# Patient Record
Sex: Female | Born: 1980
Health system: Southern US, Community
[De-identification: ages and names within clinical notes are randomized; demographics above are authoritative.]

## PROBLEM LIST (undated history)

## (undated) DIAGNOSIS — G4700 Insomnia, unspecified: Secondary | ICD-10-CM

## (undated) DIAGNOSIS — F988 Other specified behavioral and emotional disorders with onset usually occurring in childhood and adolescence: Secondary | ICD-10-CM

## (undated) DIAGNOSIS — R7309 Other abnormal glucose: Secondary | ICD-10-CM

## (undated) DIAGNOSIS — F419 Anxiety disorder, unspecified: Secondary | ICD-10-CM

## (undated) DIAGNOSIS — H53031 Strabismic amblyopia, right eye: Secondary | ICD-10-CM

## (undated) DIAGNOSIS — F410 Panic disorder [episodic paroxysmal anxiety] without agoraphobia: Secondary | ICD-10-CM

## (undated) DIAGNOSIS — J309 Allergic rhinitis, unspecified: Secondary | ICD-10-CM

## (undated) DIAGNOSIS — F32A Depression, unspecified: Secondary | ICD-10-CM

## (undated) DIAGNOSIS — G43909 Migraine, unspecified, not intractable, without status migrainosus: Secondary | ICD-10-CM

## (undated) DIAGNOSIS — F329 Major depressive disorder, single episode, unspecified: Secondary | ICD-10-CM

## (undated) DIAGNOSIS — J45909 Unspecified asthma, uncomplicated: Secondary | ICD-10-CM

## (undated) HISTORY — DX: Strabismic amblyopia, right eye: H53.031

## (undated) HISTORY — DX: Migraine, unspecified, not intractable, without status migrainosus: G43.909

## (undated) HISTORY — DX: Insomnia, unspecified: G47.00

## (undated) HISTORY — DX: Other abnormal glucose: R73.09

## (undated) HISTORY — DX: Panic disorder (episodic paroxysmal anxiety): F41.0

## (undated) HISTORY — DX: Other specified behavioral and emotional disorders with onset usually occurring in childhood and adolescence: F98.8

## (undated) HISTORY — DX: Depression, unspecified: F32.A

## (undated) HISTORY — DX: Anxiety disorder, unspecified: F41.9

## (undated) HISTORY — DX: Unspecified asthma, uncomplicated: J45.909

## (undated) HISTORY — DX: Allergic rhinitis, unspecified: J30.9

## (undated) HISTORY — DX: Major depressive disorder, single episode, unspecified: F32.9

---

## 2003-12-27 ENCOUNTER — Ambulatory Visit: Payer: Self-pay | Admitting: Psychiatry

## 2004-04-10 ENCOUNTER — Ambulatory Visit: Payer: Self-pay

## 2004-04-15 ENCOUNTER — Ambulatory Visit: Payer: Self-pay

## 2005-07-09 ENCOUNTER — Ambulatory Visit: Payer: Self-pay

## 2005-10-29 DIAGNOSIS — F900 Attention-deficit hyperactivity disorder, predominantly inattentive type: Secondary | ICD-10-CM | POA: Insufficient documentation

## 2005-10-29 DIAGNOSIS — G43109 Migraine with aura, not intractable, without status migrainosus: Secondary | ICD-10-CM | POA: Insufficient documentation

## 2006-12-16 DIAGNOSIS — F4321 Adjustment disorder with depressed mood: Secondary | ICD-10-CM | POA: Insufficient documentation

## 2009-03-18 ENCOUNTER — Ambulatory Visit: Payer: Self-pay | Admitting: Family Medicine

## 2013-05-15 LAB — CBC AND DIFFERENTIAL
HCT: 48 % — AB (ref 36–46)
Hemoglobin: 16 g/dL (ref 12.0–16.0)
Platelets: 294 10*3/uL (ref 150–399)
WBC: 8.1 10^3/mL

## 2013-06-23 ENCOUNTER — Ambulatory Visit: Payer: Self-pay | Admitting: Medical

## 2013-06-23 LAB — URINALYSIS, COMPLETE: RBC,UR: >30 /HPF (ref 0–5)

## 2013-06-25 LAB — URINE CULTURE

## 2015-04-04 ENCOUNTER — Encounter: Payer: Self-pay | Admitting: Family Medicine

## 2015-04-04 ENCOUNTER — Ambulatory Visit (INDEPENDENT_AMBULATORY_CARE_PROVIDER_SITE_OTHER): Payer: BLUE CROSS/BLUE SHIELD | Admitting: Family Medicine

## 2015-04-04 VITALS — BP 132/94 | HR 95 | Temp 99.1°F | Resp 16 | Wt 121.8 lb

## 2015-04-04 DIAGNOSIS — R509 Fever, unspecified: Secondary | ICD-10-CM | POA: Diagnosis not present

## 2015-04-04 DIAGNOSIS — J029 Acute pharyngitis, unspecified: Secondary | ICD-10-CM

## 2015-04-04 LAB — POC INFLUENZA A&B (BINAX/QUICKVUE)
INFLUENZA A, POC: NEGATIVE
Influenza B, POC: NEGATIVE

## 2015-04-04 LAB — POCT RAPID STREP A (OFFICE): RAPID STREP A SCREEN: NEGATIVE

## 2015-04-04 MED ORDER — AZITHROMYCIN 250 MG PO TABS: ORAL_TABLET | ORAL | Status: DC

## 2015-04-04 MED ORDER — PROMETHAZINE-DM 6.25-15 MG/5ML PO SYRP: 5.0000 mL | ORAL_SOLUTION | Freq: Four times a day (QID) | ORAL | Status: DC | PRN

## 2015-04-04 NOTE — Progress Notes (Signed)
Patient ID: Debra Garza, female   DOB: November 03, 1980, 35 y.o.   MRN: [id number]   Patient: Debra Garza Female    DOB: November 03, 1980   35 y.o.   MRN: [id number] Visit Date: 04/04/2015  Today's Provider: Dortha Kernennis Chrismon, PA   Chief Complaint  Patient presents with  . URI   Subjective:    URI  This is a new problem. The current episode started in the past 7 days. The problem has been unchanged. Associated symptoms include congestion, coughing and headaches. Associated symptoms comments: fever. Treatments tried: Nyquil. The treatment provided moderate relief.    Patient Active Problem List   Diagnosis Date Noted  . Adjustment disorder with depressed mood 12/16/2006  . ADD (attention deficit hyperactivity disorder, inattentive type) 10/29/2005  . Acute onset aura migraine 10/29/2005   Past Surgical History  Procedure Laterality Date  . No past surgeries     Family History  Problem Relation Age of Onset  . Healthy Mother   . Healthy Father   . Healthy Sister   . Liver cancer Maternal Grandmother    Previous Medications   ALBUTEROL (PROVENTIL HFA;VENTOLIN HFA) 108 (90 BASE) MCG/ACT INHALER       BUPROPION (WELLBUTRIN XL) 150 MG 24 HR TABLET    TK 1 T PO QAM   DEXTROAMPHETAMINE (DEXEDRINE SPANSULE) 10 MG 24 HR CAPSULE    TK 1 TO 3 CS PO D   ELETRIPTAN (RELPAX) 20 MG TABLET    Take by mouth.   EPINEPHRINE, ANAPHYLAXIS THERAPY AGENTS,       MIRTAZAPINE (REMERON) 15 MG TABLET    TK 1 T PO QHS   MULTIPLE VITAMIN PO       NUVARING 0.12-0.015 MG/24HR VAGINAL RING       RANITIDINE (ZANTAC) 75 MG TABLET    Take by mouth.   VYVANSE 40 MG CAPSULE    TK 1 C PO ONCE D   Allergies  Allergen Reactions  . Codeine     Other reaction(s): Vomiting  . Penicillins Hives  . Tape     Review of Systems  Constitutional: Positive for fever.  HENT: Positive for congestion.   Eyes: Negative.   Respiratory: Positive for cough.   Cardiovascular: Negative.   Gastrointestinal: Negative.    Endocrine: Negative.   Genitourinary: Negative.   Musculoskeletal: Negative.   Skin: Negative.   Allergic/Immunologic: Negative.   Neurological: Positive for headaches.  Hematological: Negative.   Psychiatric/Behavioral: Negative.     Social History  Substance Use Topics  . Smoking status: Never Smoker   . Smokeless tobacco: Never Used  . Alcohol Use: 0.0 oz/week    0 Standard drinks or equivalent per week     Comment: occasionally   Objective:   BP 132/94 mmHg  Pulse 95  Temp(Src) 99.1 F (37.3 C) (Oral)  Resp 16  Wt 121 lb 12.8 oz (55.248 kg)  SpO2 98%  Physical Exam  Constitutional: She is oriented to person, place, and time. She appears well-developed and well-nourished.  HENT:  Head: Normocephalic.  Right Ear: External ear normal.  Left Ear: External ear normal.  Red posterior pharynx without exudates. Good sinus transillumination.  Eyes: Conjunctivae and EOM are normal.  Neck: Normal range of motion. Neck supple.  Cardiovascular: Normal rate, regular rhythm and normal heart sounds.   Pulmonary/Chest: Effort normal and breath sounds normal.  Abdominal: Soft. Bowel sounds are normal.  Lymphadenopathy:    She has no cervical adenopathy.  Neurological: She is alert and  oriented to person, place, and time.  Skin: No rash noted.      Assessment & Plan:     1. Fever, unspecified fever cause Low grade over the past 3-4 days. Negative flu test. Throat very red and inflamed. May use Tylenol or Advil prn. Increase fluid intake and home to rest. No work for 3 days. 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 - POC Influenza A&B(BINAX/QUICKVUE) - azithromycin (ZITHROMAX) 250 MG tablet; Take 2 tablets by mouth the first day then one daily for 4 days  Dispense: 6 tablet; Refill: 0  2. Sorethroat Onset with some cough and nasal congestion. Strep test negative. Given Z-pak and cough/congestion syrup. May gargle with hot saltwater. Recheck prn. - POCT  rapid strep A - promethazine-dextromethorphan (PROMETHAZINE-DM) 6.25-15 MG/5ML syrup; Take 5 mLs by mouth 4 (four) times daily as needed for cough. Also, for congestion.  Dispense: 118 mL; Refill: 0

## 2015-04-04 NOTE — Patient Instructions (Addendum)
Viral Infections A virus is a type of germ. Viruses can cause:  Minor sore throats.  Aches and pains.  Headaches.  Runny nose.  Rashes.  Watery eyes.  Tiredness.  Coughs.  Loss of appetite.  Feeling sick to your stomach (nausea).  Throwing up (vomiting).  Watery poop (diarrhea). HOME CARE   Only take medicines as told by your doctor.  Drink enough water and fluids to keep your pee (urine) clear or pale yellow. Sports drinks are a good choice.  Get plenty of rest and eat healthy. Soups and broths with crackers or rice are fine. GET HELP RIGHT AWAY IF:   You have a very bad headache.  You have shortness of breath.  You have chest pain or neck pain.  You have an unusual rash.  You cannot stop throwing up.  You have watery poop that does not stop.  You cannot keep fluids down.  You or your child has a temperature by mouth above 102 F (38.9 C), not controlled by medicine.  Your baby is older than 3 months with a rectal temperature of 102 F (38.9 C) or higher.  Your baby is 313 months old or younger with a rectal temperature of 100.4 F (38 C) or higher. MAKE SURE YOU:   Understand these instructions.  Will watch this condition.  Will get help right away if you are not doing well or get worse.   This information is not intended to replace advice given to you by your health care provider. Make sure you discuss any questions you have with your health care provider.   Document Released: 12/25/2007 Document Revised: 04/05/2011 Document Reviewed: 06/19/2014 Elsevier Interactive Patient Education 2016 [employer] Fever, Adult A fever is an increase in the body's temperature. It is usually defined as a temperature of 100F (38C) or higher. Brief mild or moderate fevers generally have no long-term effects, and they often do not require treatment. Moderate or high fevers may make you feel uncomfortable and can sometimes be a sign of a serious illness or  disease. The sweating that may occur with repeated or prolonged fever may also cause dehydration. Fever is confirmed by taking a temperature with a thermometer. A measured temperature can vary with: Age. Time of day. Location of the thermometer: Mouth (oral). Rectum (rectal). Ear (tympanic). Underarm (axillary). Forehead (temporal). HOME CARE INSTRUCTIONS Pay attention to any changes in your symptoms. Take these actions to help with your condition: Take over-the counter and prescription medicines only as told by your health care provider. Follow the dosing instructions carefully. If you were prescribed an antibiotic medicine, take it as told by your health care provider. Do not stop taking the antibiotic even if you start to feel better. Rest as needed. Drink enough fluid to keep your urine clear or pale yellow. This helps to prevent dehydration. Sponge yourself or bathe with room-temperature water to help reduce your body temperature as needed. Do not use ice water. Do not overbundle yourself in blankets or heavy clothes. SEEK MEDICAL CARE IF: You vomit. You cannot eat or drink without vomiting. You have diarrhea. You have pain when you urinate. Your symptoms do not improve with treatment. You develop new symptoms. You develop excessive weakness. SEEK IMMEDIATE MEDICAL CARE IF: You have shortness of breath or have trouble breathing. You are dizzy or you faint. You are disoriented or confused. You develop signs of dehydration, such as a dry mouth, decreased urination, or paleness. You develop severe pain in your abdomen.  You have persistent vomiting or diarrhea. You develop a skin rash. Your symptoms suddenly get worse.   This information is not intended to replace advice given to you by your health care provider. Make sure you discuss any questions you have with your health care provider.   Document Released: 07/07/2000 Document Revised: 10/02/2014 Document Reviewed:  03/07/2014 Elsevier Interactive Patient Education [employer]

## 2015-06-13 ENCOUNTER — Other Ambulatory Visit: Payer: Self-pay | Admitting: Family Medicine

## 2015-06-13 MED ORDER — EPINEPHRINE 0.3 MG/0.3ML IJ SOAJ: 0.3000 mg | Freq: Once | INTRAMUSCULAR | Status: DC

## 2015-06-13 MED ORDER — ALBUTEROL SULFATE HFA 108 (90 BASE) MCG/ACT IN AERS: 2.0000 | INHALATION_SPRAY | RESPIRATORY_TRACT | Status: DC | PRN

## 2015-06-22 ENCOUNTER — Other Ambulatory Visit: Payer: Self-pay | Admitting: Family Medicine

## 2015-08-12 LAB — HM PAP SMEAR: HM PAP: NEGATIVE

## 2015-10-23 ENCOUNTER — Encounter: Payer: Self-pay | Admitting: Emergency Medicine

## 2015-10-23 ENCOUNTER — Emergency Department
Admission: EM | Admit: 2015-10-23 | Discharge: 2015-10-23 | Disposition: A | Discharge status: Home / Self Care | Payer: 59 | Attending: Emergency Medicine | Admitting: Emergency Medicine

## 2015-10-23 DIAGNOSIS — Y69 Unspecified misadventure during surgical and medical care: Secondary | ICD-10-CM | POA: Insufficient documentation

## 2015-10-23 DIAGNOSIS — T50995A Adverse effect of other drugs, medicaments and biological substances, initial encounter: Secondary | ICD-10-CM | POA: Diagnosis not present

## 2015-10-23 DIAGNOSIS — R000 Tachycardia, unspecified: Secondary | ICD-10-CM | POA: Diagnosis not present

## 2015-10-23 DIAGNOSIS — F908 Attention-deficit hyperactivity disorder, other type: Secondary | ICD-10-CM | POA: Diagnosis not present

## 2015-10-23 DIAGNOSIS — T485X5A Adverse effect of other anti-common-cold drugs, initial encounter: Secondary | ICD-10-CM | POA: Diagnosis not present

## 2015-10-23 DIAGNOSIS — Z79899 Other long term (current) drug therapy: Secondary | ICD-10-CM | POA: Insufficient documentation

## 2015-10-23 DIAGNOSIS — T50905A Adverse effect of unspecified drugs, medicaments and biological substances, initial encounter: Secondary | ICD-10-CM

## 2015-10-23 DIAGNOSIS — T887XXA Unspecified adverse effect of drug or medicament, initial encounter: Secondary | ICD-10-CM | POA: Insufficient documentation

## 2015-10-23 DIAGNOSIS — Z792 Long term (current) use of antibiotics: Secondary | ICD-10-CM | POA: Insufficient documentation

## 2015-10-23 DIAGNOSIS — Z791 Long term (current) use of non-steroidal anti-inflammatories (NSAID): Secondary | ICD-10-CM | POA: Diagnosis not present

## 2015-10-23 DIAGNOSIS — R05 Cough: Secondary | ICD-10-CM | POA: Diagnosis present

## 2015-10-23 LAB — BASIC METABOLIC PANEL
ANION GAP: 8 (ref 5–15)
BUN: 11 mg/dL (ref 6–20)
CALCIUM: 9.4 mg/dL (ref 8.9–10.3)
CO2: 23 mmol/L (ref 22–32)
Chloride: 103 mmol/L (ref 101–111)
Creatinine, Ser: 0.9 mg/dL (ref 0.44–1.00)
Glucose, Bld: 125 mg/dL — ABNORMAL HIGH (ref 65–99)
Potassium: 3.3 mmol/L — ABNORMAL LOW (ref 3.5–5.1)
Sodium: 134 mmol/L — ABNORMAL LOW (ref 135–145)

## 2015-10-23 LAB — URINE DRUG SCREEN, QUALITATIVE (ARMC ONLY)
Amphetamines, Ur Screen: NOT DETECTED
BARBITURATES, UR SCREEN: NOT DETECTED
Benzodiazepine, Ur Scrn: NOT DETECTED
CANNABINOID 50 NG, UR ~~LOC~~: NOT DETECTED
COCAINE METABOLITE, UR ~~LOC~~: NOT DETECTED
MDMA (Ecstasy)Ur Screen: NOT DETECTED
Methadone Scn, Ur: NOT DETECTED
OPIATE, UR SCREEN: NOT DETECTED
PHENCYCLIDINE (PCP) UR S: NOT DETECTED
Tricyclic, Ur Screen: NOT DETECTED

## 2015-10-23 LAB — URINALYSIS COMPLETE WITH MICROSCOPIC (ARMC ONLY)
BILIRUBIN URINE: NEGATIVE
Bacteria, UA: NONE SEEN
GLUCOSE, UA: NEGATIVE mg/dL
HGB URINE DIPSTICK: NEGATIVE
KETONES UR: NEGATIVE mg/dL
Leukocytes, UA: NEGATIVE
Nitrite: NEGATIVE
Protein, ur: NEGATIVE mg/dL
SPECIFIC GRAVITY, URINE: 1.001 — AB (ref 1.005–1.030)
pH: 7 (ref 5.0–8.0)

## 2015-10-23 LAB — CBC
HEMATOCRIT: 46.1 % (ref 35.0–47.0)
Hemoglobin: 16.1 g/dL — ABNORMAL HIGH (ref 12.0–16.0)
MCH: 29.6 pg (ref 26.0–34.0)
MCHC: 34.9 g/dL (ref 32.0–36.0)
MCV: 85 fL (ref 80.0–100.0)
Platelets: 301 10*3/uL (ref 150–440)
RBC: 5.42 MIL/uL — ABNORMAL HIGH (ref 3.80–5.20)
RDW: 12.6 % (ref 11.5–14.5)
WBC: 9.2 10*3/uL (ref 3.6–11.0)

## 2015-10-23 LAB — TROPONIN I

## 2015-10-23 LAB — POCT PREGNANCY, URINE: Preg Test, Ur: NEGATIVE

## 2015-10-23 MED ORDER — SODIUM CHLORIDE 0.9 % IV BOLUS (SEPSIS)
1000.0000 mL | Freq: Once | INTRAVENOUS | Status: AC
  Administered 2015-10-23: 1000 mL via INTRAVENOUS

## 2015-10-23 NOTE — ED Provider Notes (Signed)
[facility] Emergency Department Provider Note  ____________________________________________   I have reviewed the triage vital signs and the nursing notes.   HISTORY  Chief Complaint Tachycardia    HPI Debra Garza is a 35 y.o. female states she's had a mild runny nose for a few days. She started takingchlorpheniramine maleate with dextromethorphan pills. She took one this evening and became very jittery and hyper she states. She has a problem with this in the past. She especially has a problem taking pseudoephedrine because it has the same reaction. She feels jittery and nervous. This is a known side effect of the medication. She has not had any hallucinations. She did not take an overdose. She has no SI or HI. She states she is feeling much better now that time has gone by she still feels a little bit jittery. She also wonders if she might be dehydrated because she hasn't been eating or drinking very much because her nose is been stuffy. She is on a productive cough fever chills headache stiff neck chest pain shortness of breath or any other new or worrisome symptoms.     History reviewed. No pertinent past medical history.  Patient Active Problem List   Diagnosis Date Noted  . Adjustment disorder with depressed mood 12/16/2006  . ADD (attention deficit hyperactivity disorder, inattentive type) 10/29/2005  . Acute onset aura migraine 10/29/2005    Past Surgical History:  Procedure Laterality Date  . NO PAST SURGERIES      Prior to Admission medications   Medication Sig Start Date End Date Taking? Authorizing Provider  azithromycin (ZITHROMAX) 250 MG tablet Take 2 tablets by mouth the first day then one daily for 4 days 04/04/15   Jodell Ciproennis E Chrismon, PA  buPROPion (WELLBUTRIN XL) 150 MG 24 hr tablet TK 1 T PO QAM 03/16/15   Historical Provider, MD  dextroamphetamine (DEXEDRINE SPANSULE) 10 MG 24 hr capsule TK 1 TO 3 CS PO D 03/29/15   Historical  Provider, MD  eletriptan (RELPAX) 20 MG tablet Take by mouth. 06/26/12   Historical Provider, MD  EPINEPHrine 0.3 mg/0.3 mL IJ SOAJ injection Inject 0.3 mLs (0.3 mg total) into the muscle once. 06/13/15   Jodell Ciproennis E Chrismon, PA  mirtazapine (REMERON) 15 MG tablet TK 1 T PO QHS 03/01/15   Historical Provider, MD  MULTIPLE VITAMIN PO  06/07/06   Historical Provider, MD  NUVARING 0.12-0.015 MG/24HR vaginal ring  03/16/15   Historical Provider, MD  PROAIR HFA 108 (90 Base) MCG/ACT inhaler INHALE 2 PUFFS INTO THE LUNGS EVERY 4 HOURS AS NEEDED FOR WHEEZING OR SHORTNESS OF BREATH 06/29/15   Jodell Ciproennis E Chrismon, PA  promethazine-dextromethorphan (PROMETHAZINE-DM) 6.25-15 MG/5ML syrup Take 5 mLs by mouth 4 (four) times daily as needed for cough. Also, for congestion. 04/04/15   Jodell Ciproennis E Chrismon, PA  ranitidine (ZANTAC) 75 MG tablet Take by mouth.    Historical Provider, MD  VYVANSE 40 MG capsule TK 1 C PO ONCE D 03/29/15   Historical Provider, MD    Allergies Augmentin [amoxicillin-pot clavulanate]; Codeine; Penicillins; Sudafed [pseudoephedrine hcl]; and Tape  Family History  Problem Relation Age of Onset  . Healthy Mother   . Healthy Father   . Healthy Sister   . Liver cancer Maternal Grandmother     Social History Social History  Substance Use Topics  . Smoking status: Never Smoker  . Smokeless tobacco: Never Used  . Alcohol use 0.0 oz/week     Comment: occasionally  Review of Systems Constitutional: No fever/chills Eyes: No visual changes. ENT: No sore throat. No stiff neck no neck pain Cardiovascular: Denies chest pain. Respiratory: Denies shortness of breath. Gastrointestinal:   no vomiting.  No diarrhea.  No constipation. Genitourinary: Negative for dysuria. Musculoskeletal: Negative lower extremity swelling Skin: Negative for rash. Neurological: Negative for severe headaches, focal weakness or numbness. 10-point ROS otherwise  negative.  ____________________________________________   PHYSICAL EXAM:  VITAL SIGNS: ED Triage Vitals [10/23/15 0142]  Enc Vitals Group     BP (!) 190/108     Pulse Rate (!) 130     Resp 18     Temp 97.8 F (36.6 C)     Temp Source Oral     SpO2 100 %     Weight 118 lb (53.5 kg)     Height 5\' 8"  (1.727 m)     Head Circumference      Peak Flow      Pain Score      Pain Loc      Pain Edu?      Excl. in GC?     Constitutional: Alert and oriented. Well appearing and in no acute distress.Anxious but otherwise in no acute distress Eyes: Conjunctivae are normal. PERRL. EOMI. Head: Atraumatic. Nose: Mild clear congestion/rhinnorhea. Mouth/Throat: Mucous membranes are moist.  Oropharynx non-erythematous. Neck: No stridor.   Nontender with no meningismus Cardiovascular: Normal rate, regular rhythm. Grossly normal heart sounds.  Good peripheral circulation. Respiratory: Normal respiratory effort.  No retractions. Lungs CTAB. Abdominal: Soft and nontender. No distention. No guarding no rebound Back:  There is no focal tenderness or step off.  there is no midline tenderness there are no lesions noted. there is no CVA tenderness Musculoskeletal: No lower extremity tenderness, no upper extremity tenderness. No joint effusions, no DVT signs strong distal pulses no edema Neurologic:  Normal speech and language. No gross focal neurologic deficits are appreciated.  Skin:  Skin is warm, dry and intact. No rash noted.   ____________________________________________   LABS (all labs ordered are listed, but only abnormal results are displayed)  Labs Reviewed  CBC - Abnormal; Notable for the following:       Result Value   RBC 5.42 (*)    Hemoglobin 16.1 (*)    All other components within normal limits  BASIC METABOLIC PANEL - Abnormal; Notable for the following:    Sodium 134 (*)    Potassium 3.3 (*)    Glucose, Bld 125 (*)    All other components within normal limits  URINALYSIS  COMPLETEWITH MICROSCOPIC ([facility] ONLY) - Abnormal; Notable for the following:    Color, Urine COLORLESS (*)    APPearance CLEAR (*)    Specific Gravity, Urine 1.001 (*)    Squamous Epithelial / LPF 0-5 (*)    All other components within normal limits  TROPONIN I  URINE DRUG SCREEN, QUALITATIVE ([facility] ONLY)  POCT PREGNANCY, URINE   ____________________________________________  EKG  I personally interpreted any EKGs ordered by me or triage Sinus tachycardia rate 131 bpm no acute ST elevation or acute ST depression normal axis ____________________________________________  RADIOLOGY  I reviewed any imaging ordered by me or triage that were performed during my shift and, if possible, patient and/or family made aware of any abnormal findings. ____________________________________________   PROCEDURES  Procedure(s) performed: None  Procedures  Critical Care performed: None  ____________________________________________   INITIAL IMPRESSION / ASSESSMENT AND PLAN / ED COURSE  Pertinent labs & imaging results that were  available during my care of the patient were reviewed by me and considered in my medical decision making (see chart for details).  Patient here with mild tachycardia rate at this time is 104 after taking over-the-counter decongestants. She denies overdose. She is quite well-appearing although somewhat anxious. Mild tachycardia could be an adverse drug reaction or what she describes as "anxiety and jitteriness, or some combination of the 2. In any event, her heart rate is coming down and she is in no acute medical distress. We will give her IV fluids to see if it could be some contributing feature of dehydration although her blood work and her exam I certainly not show anything significant.   Clinical Course   ____________________________________________   FINAL CLINICAL IMPRESSION(S) / ED DIAGNOSES  Final diagnoses:  None      This chart was dictated using  voice recognition software.  Despite best efforts to proofread,  errors can occur which can change meaning.      Jeanmarie Plant, MD 10/23/15 [phone]

## 2015-10-23 NOTE — ED Triage Notes (Signed)
Patient ambulatory to triage with steady gait, without difficulty or distress noted; pt reports taking coricidin at 830 for cold symptoms; st since having dizziness and feeling shaky

## 2015-10-23 NOTE — Discharge Instructions (Signed)
I would strongly advised avoiding that particular medication in the future, return to the emergency room if you have any new or worrisome symptoms including chest pain shortness of breath or other concerning symptoms. Follow closely with your  primary care doctor.

## 2015-10-31 ENCOUNTER — Ambulatory Visit (INDEPENDENT_AMBULATORY_CARE_PROVIDER_SITE_OTHER): Payer: 59 | Admitting: Family Medicine

## 2015-10-31 ENCOUNTER — Encounter: Payer: Self-pay | Admitting: Family Medicine

## 2015-10-31 VITALS — BP 122/88 | HR 84 | Temp 98.0°F | Wt 115.8 lb

## 2015-10-31 DIAGNOSIS — T50905D Adverse effect of unspecified drugs, medicaments and biological substances, subsequent encounter: Secondary | ICD-10-CM | POA: Diagnosis not present

## 2015-10-31 DIAGNOSIS — J069 Acute upper respiratory infection, unspecified: Secondary | ICD-10-CM

## 2015-10-31 NOTE — Progress Notes (Signed)
Patient: Debra Garza Female    DOB: 1980/06/28   35 y.o.   MRN: [id number] Visit Date: 10/31/2015  Today's Provider: Dortha Kern, PA   Chief Complaint  Patient presents with  . Hospitalization Follow-up   Subjective:    HPI  Follow up Hospitalization  Patient was admitted to [facility] on 10/23/2015 and discharged on 10/23/2015. She was treated for adverse reaction to OTC medication Treatment for this included stop taking chlorpheniramine maleate with dextromethorphan pills She reports excellent compliance with treatment. She reports this condition is Improved.  ------------------------------------------------------------------------------------   Patient Active Problem List   Diagnosis Date Noted  . Adjustment disorder with depressed mood 12/16/2006  . ADD (attention deficit hyperactivity disorder, inattentive type) 10/29/2005  . Acute onset aura migraine 10/29/2005   Past Surgical History:  Procedure Laterality Date  . NO PAST SURGERIES     Family History  Problem Relation Age of Onset  . Healthy Mother   . Healthy Father   . Healthy Sister   . Liver cancer Maternal Grandmother    Allergies  Allergen Reactions  . Augmentin [Amoxicillin-Pot Clavulanate] Hives  . Codeine     Other reaction(s): Vomiting  . Hydrocodone Other (See Comments)  . Penicillins Hives  . Sudafed [Pseudoephedrine Hcl]   . Tape      Previous Medications   BUPROPION (WELLBUTRIN XL) 150 MG 24 HR TABLET    TK 1 T PO QAM   DEXTROAMPHETAMINE (DEXEDRINE SPANSULE) 10 MG 24 HR CAPSULE    TK 1 TO 3 CS PO D   ELETRIPTAN (RELPAX) 20 MG TABLET    Take by mouth.   EPINEPHRINE 0.3 MG/0.3 ML IJ SOAJ INJECTION    Inject 0.3 mLs (0.3 mg total) into the muscle once.   MIRTAZAPINE (REMERON) 15 MG TABLET    TK 1 T PO QHS   MULTIPLE VITAMIN PO       NUVARING 0.12-0.015 MG/24HR VAGINAL RING       PROAIR HFA 108 (90 BASE) MCG/ACT INHALER    INHALE 2 PUFFS INTO THE LUNGS EVERY 4 HOURS AS NEEDED FOR  WHEEZING OR SHORTNESS OF BREATH   RANITIDINE (ZANTAC) 75 MG TABLET    Take by mouth.   VYVANSE 40 MG CAPSULE    TK 1 C PO ONCE D    Review of Systems  Constitutional: Negative.   Respiratory: Negative.   Cardiovascular: Negative.     Social History  Substance Use Topics  . Smoking status: Never Smoker  . Smokeless tobacco: Never Used  . Alcohol use 0.0 oz/week     Comment: occasionally   Objective:   BP 122/88 (BP Location: Right Arm, Patient Position: Sitting, Cuff Size: Normal)   Pulse 84   Temp 98 F (36.7 C) (Oral)   Wt 115 lb 12.8 oz (52.5 kg)   LMP 10/23/2015 (Exact Date)   BMI 17.61 kg/m  BP Readings from Last 3 Encounters:  10/31/15 122/88  10/23/15 (!) 147/97  04/04/15 (!) 132/94   Physical Exam  Constitutional: She is oriented to person, place, and time. She appears well-developed and well-nourished. No distress.  HENT:  Head: Normocephalic and atraumatic.  Right Ear: Hearing and external ear normal.  Left Ear: Hearing and external ear normal.  Nose: Nose normal.  Slightly injected and cobblestone appearance of posterior pharynx.  Eyes: Conjunctivae and lids are normal. Right eye exhibits no discharge. Left eye exhibits no discharge. No scleral icterus.  Neck: Neck supple.  Cardiovascular: Normal rate and regular  rhythm.   Pulmonary/Chest: Effort normal and breath sounds normal. No respiratory distress.  Abdominal: Bowel sounds are normal.  Musculoskeletal: Normal range of motion.  Lymphadenopathy:    She has no cervical adenopathy.  Neurological: She is alert and oriented to person, place, and time.  Skin: Skin is intact. No lesion and no rash noted.  Psychiatric: She has a normal mood and affect. Her speech is normal and behavior is normal. Thought content normal.      Assessment & Plan:     1. Adverse effect of over-the-counter medication, subsequent encounter Onset 10-23-15 after taking a cough and cold medication. EKG in the ER was read as sinus  tachycardia. Labs essentially normal. Not having any recurrences. Will switch to plain Mucinex and salt water gargles prn. Recheck prn. Discontinue any decongestants and caffeine.  2. Upper respiratory tract infection, unspecified type No fever. Still some scratchy throat and slight cough. May use Mucinex (plain) with salt water gargles. Recheck prn.

## 2015-12-04 ENCOUNTER — Other Ambulatory Visit: Payer: Self-pay | Admitting: Family Medicine

## 2015-12-04 ENCOUNTER — Ambulatory Visit
Admission: RE | Admit: 2015-12-04 | Discharge: 2015-12-04 | Disposition: A | Discharge status: Home / Self Care | Payer: 59 | Source: Ambulatory Visit | Attending: Family Medicine | Admitting: Family Medicine

## 2015-12-04 ENCOUNTER — Ambulatory Visit (INDEPENDENT_AMBULATORY_CARE_PROVIDER_SITE_OTHER): Payer: 59 | Admitting: Family Medicine

## 2015-12-04 ENCOUNTER — Encounter: Payer: Self-pay | Admitting: Family Medicine

## 2015-12-04 VITALS — BP 148/96 | HR 98 | Temp 98.3°F | Resp 18 | Wt 118.0 lb

## 2015-12-04 DIAGNOSIS — R05 Cough: Secondary | ICD-10-CM | POA: Insufficient documentation

## 2015-12-04 DIAGNOSIS — J069 Acute upper respiratory infection, unspecified: Secondary | ICD-10-CM

## 2015-12-04 DIAGNOSIS — R053 Chronic cough: Secondary | ICD-10-CM

## 2015-12-04 MED ORDER — PREDNISONE 5 MG PO TABS: 5.0000 mg | ORAL_TABLET | Freq: Every day | ORAL | 0 refills | Status: DC

## 2015-12-04 MED ORDER — DOXYCYCLINE HYCLATE 100 MG PO TABS: 100.0000 mg | ORAL_TABLET | Freq: Two times a day (BID) | ORAL | 0 refills | Status: DC

## 2015-12-04 MED ORDER — BENZONATATE 100 MG PO CAPS: 100.0000 mg | ORAL_CAPSULE | Freq: Three times a day (TID) | ORAL | 0 refills | Status: DC | PRN

## 2015-12-04 NOTE — Progress Notes (Signed)
Patient: Debra SchullerBrittany L Pecinich Female    DOB: 1980-04-15   34 y.o.   MRN: [id number] Visit Date: 12/04/2015  Today's Provider: Dortha Kernennis Josefina Rynders, PA   Chief Complaint  Patient presents with  . URI   Subjective:    URI   This is a new problem. The current episode started more than 1 month ago. The problem has been unchanged. Associated symptoms include congestion, coughing and sinus pain. She has tried antihistamine for the symptoms. The treatment provided no relief.   Patient Active Problem List   Diagnosis Date Noted  . Adjustment disorder with depressed mood 12/16/2006  . ADD (attention deficit hyperactivity disorder, inattentive type) 10/29/2005  . Acute onset aura migraine 10/29/2005   Past Surgical History:  Procedure Laterality Date  . NO PAST SURGERIES     Family History  Problem Relation Age of Onset  . Healthy Mother   . Healthy Father   . Healthy Sister   . Liver cancer Maternal Grandmother    Allergies  Allergen Reactions  . Augmentin [Amoxicillin-Pot Clavulanate] Hives  . Codeine     Other reaction(s): Vomiting  . Hydrocodone Other (See Comments)  . Penicillins Hives  . Sudafed [Pseudoephedrine Hcl]   . Tape      Previous Medications   BUPROPION (WELLBUTRIN XL) 150 MG 24 HR TABLET    TK 1 T PO QAM   DEXTROAMPHETAMINE (DEXEDRINE SPANSULE) 10 MG 24 HR CAPSULE    TK 1 TO 3 CS PO D   ELETRIPTAN (RELPAX) 20 MG TABLET    Take by mouth.   EPINEPHRINE 0.3 MG/0.3 ML IJ SOAJ INJECTION    Inject 0.3 mLs (0.3 mg total) into the muscle once.   MIRTAZAPINE (REMERON) 15 MG TABLET    TK 1 T PO QHS   MULTIPLE VITAMIN PO       NUVARING 0.12-0.015 MG/24HR VAGINAL RING       PROAIR HFA 108 (90 BASE) MCG/ACT INHALER    INHALE 2 PUFFS INTO THE LUNGS EVERY 4 HOURS AS NEEDED FOR WHEEZING OR SHORTNESS OF BREATH   RANITIDINE (ZANTAC) 75 MG TABLET    Take by mouth.   VYVANSE 40 MG CAPSULE    TK 1 C PO ONCE D    Review of Systems  Constitutional: Negative.   HENT: Positive for  congestion and sinus pain.   Respiratory: Positive for cough.   Cardiovascular: Negative.     Social History  Substance Use Topics  . Smoking status: Never Smoker  . Smokeless tobacco: Never Used  . Alcohol use 0.0 oz/week     Comment: occasionally   Objective:   BP (!) 148/96 (BP Location: Right Arm, Patient Position: Sitting, Cuff Size: Normal)   Pulse 98   Temp 98.3 F (36.8 C) (Oral)   Resp 18   Wt 118 lb (53.5 kg)   BMI 17.94 kg/m   Physical Exam  Constitutional: She is oriented to person, place, and time. She appears well-developed and well-nourished. No distress.  HENT:  Head: Normocephalic and atraumatic.  Right Ear: Hearing and external ear normal.  Left Ear: Hearing and external ear normal.  Nose: Nose normal.  Mouth/Throat: Oropharynx is clear and moist.  Cobblestoning of posterior pharynx. No exudates. Good sinus transillumination throughout.  Eyes: Conjunctivae and lids are normal. Right eye exhibits no discharge. Left eye exhibits no discharge. No scleral icterus.  Neck: Neck supple.  Cardiovascular: Normal rate and regular rhythm.   Pulmonary/Chest: Effort normal and breath sounds normal. No  respiratory distress. She has no wheezes. She has no rales.  Abdominal: Soft. Bowel sounds are normal.  Musculoskeletal: Normal range of motion.  Neurological: She is alert and oriented to person, place, and time.  Skin: Skin is intact. No lesion and no rash noted.  Psychiatric: She has a normal mood and affect. Her speech is normal and behavior is normal. Thought content normal.      Assessment & Plan:     1. Chronic coughing Cough ticklish, hacking and some greenish sputum production the past month. Cough usually worse in the evening and early morning. Disturbing sleep and work. No fever, dyspnea or wheezing. Recommend using Zantac every day to decrease reflux risk causing persistent cough. Give Benzonatate to control cough, doxycycline for atypical pathogens and  prednisone for reactive airway coughs. Check CXR for signs of infection or lesions. Check CBC to rule out allergies or infection. Recheck pending lab/x-ray reports. - CBC with Differential/Platelet - DG Chest 2 View - predniSONE (DELTASONE) 5 MG tablet; Take 1 tablet (5 mg total) by mouth daily with breakfast. Taper dose by one tablet daily for 6 days (6,5,4,3,2,1)  Dispense: 21 tablet; Refill: 0 - doxycycline (VIBRA-TABS) 100 MG tablet; Take 1 tablet (100 mg total) by mouth 2 (two) times daily.  Dispense: 20 tablet; Refill: 0 - benzonatate (TESSALON) 100 MG capsule; Take 1 capsule (100 mg total) by mouth 3 (three) times daily as needed for cough.  Dispense: 21 capsule; Refill: 0  2. Upper respiratory tract infection, unspecified type Persistent head congestion, scratchy throat, sneezing and some greenish sputum with cough in the evening. Onset over the past month without fever. May use antihistamine. Will add Benzonatate, Doxycycline for atypical pathogens and prednisone taper. Increase fluid intake and recheck pending lab/x-ray report. - CBC with Differential/Platelet - doxycycline (VIBRA-TABS) 100 MG tablet; Take 1 tablet (100 mg total) by mouth 2 (two) times daily.  Dispense: 20 tablet; Refill: 0 - benzonatate (TESSALON) 100 MG capsule; Take 1 capsule (100 mg total) by mouth 3 (three) times daily as needed for cough.  Dispense: 21 capsule; Refill: 0

## 2015-12-05 ENCOUNTER — Telehealth: Payer: Self-pay

## 2015-12-05 LAB — CBC WITH DIFFERENTIAL/PLATELET
BASOS: 1 %
Basophils Absolute: 0.1 10*3/uL (ref 0.0–0.2)
EOS (ABSOLUTE): 0.2 10*3/uL (ref 0.0–0.4)
EOS: 2 %
HEMATOCRIT: 39.1 % (ref 34.0–46.6)
Hemoglobin: 13.4 g/dL (ref 11.1–15.9)
IMMATURE GRANS (ABS): 0 10*3/uL (ref 0.0–0.1)
IMMATURE GRANULOCYTES: 0 %
LYMPHS: 23 %
Lymphocytes Absolute: 2.4 10*3/uL (ref 0.7–3.1)
MCH: 29 pg (ref 26.6–33.0)
MCHC: 34.3 g/dL (ref 31.5–35.7)
MCV: 85 fL (ref 79–97)
Monocytes Absolute: 0.8 10*3/uL (ref 0.1–0.9)
Monocytes: 7 %
NEUTROS PCT: 67 %
Neutrophils Absolute: 6.8 10*3/uL (ref 1.4–7.0)
PLATELETS: 337 10*3/uL (ref 150–379)
RBC: 4.62 x10E6/uL (ref 3.77–5.28)
RDW: 13.5 % (ref 12.3–15.4)
WBC: 10.2 10*3/uL (ref 3.4–10.8)

## 2015-12-05 NOTE — Telephone Encounter (Signed)
-----   Message from Tamsen Roersennis E Chrismon, [place] sent at 12/05/2015  7:55 AM EST ----- Normal chest x-ray and CBC. No sign of bacterial infection. Probably having cough due to a virus and possibly some reflux. Continue Zantac 75 mg BID for 1 month and finish medications given yesterday. Recheck if no better in 2 weeks. May need evaluation by pulmonologist if no better.

## 2015-12-05 NOTE — Telephone Encounter (Signed)
Patient has been advised. KW 

## 2015-12-15 ENCOUNTER — Encounter: Payer: Self-pay | Admitting: Family Medicine

## 2015-12-15 ENCOUNTER — Other Ambulatory Visit: Payer: Self-pay | Admitting: Family Medicine

## 2015-12-15 DIAGNOSIS — F4321 Adjustment disorder with depressed mood: Secondary | ICD-10-CM

## 2015-12-15 DIAGNOSIS — F900 Attention-deficit hyperactivity disorder, predominantly inattentive type: Secondary | ICD-10-CM

## 2015-12-15 MED ORDER — BUPROPION HCL ER (XL) 150 MG PO TB24: ORAL_TABLET | ORAL | 6 refills | Status: DC

## 2016-01-09 ENCOUNTER — Telehealth: Payer: Self-pay

## 2016-01-09 NOTE — Telephone Encounter (Signed)
Patient called wanting to know if you would be willing to refill her Vyvanse until she finds a psychiatrist? Please advise. Thanks!

## 2016-01-10 ENCOUNTER — Other Ambulatory Visit: Payer: Self-pay | Admitting: Family Medicine

## 2016-01-10 MED ORDER — VYVANSE 40 MG PO CAPS: 40.0000 mg | ORAL_CAPSULE | ORAL | 0 refills | Status: DC

## 2016-01-10 NOTE — Telephone Encounter (Signed)
Will refill once. Prescription has to be printed and hand signed. Will be at the front desk for pick up.

## 2016-01-12 NOTE — Telephone Encounter (Signed)
Patient advised.

## 2016-02-26 DIAGNOSIS — F902 Attention-deficit hyperactivity disorder, combined type: Secondary | ICD-10-CM | POA: Diagnosis not present

## 2016-05-18 ENCOUNTER — Encounter: Payer: Self-pay | Admitting: Family Medicine

## 2016-05-20 DIAGNOSIS — F902 Attention-deficit hyperactivity disorder, combined type: Secondary | ICD-10-CM | POA: Diagnosis not present

## 2016-05-25 ENCOUNTER — Encounter: Payer: Self-pay | Admitting: Family Medicine

## 2016-05-25 ENCOUNTER — Ambulatory Visit (INDEPENDENT_AMBULATORY_CARE_PROVIDER_SITE_OTHER): Payer: 59 | Admitting: Family Medicine

## 2016-05-25 ENCOUNTER — Telehealth: Payer: Self-pay

## 2016-05-25 ENCOUNTER — Ambulatory Visit
Admission: RE | Admit: 2016-05-25 | Discharge: 2016-05-25 | Disposition: A | Discharge status: Home / Self Care | Payer: 59 | Source: Ambulatory Visit | Attending: Family Medicine | Admitting: Family Medicine

## 2016-05-25 VITALS — BP 128/86 | HR 105 | Temp 98.2°F | Wt 108.8 lb

## 2016-05-25 DIAGNOSIS — M79642 Pain in left hand: Secondary | ICD-10-CM | POA: Diagnosis not present

## 2016-05-25 DIAGNOSIS — S6992XA Unspecified injury of left wrist, hand and finger(s), initial encounter: Secondary | ICD-10-CM | POA: Diagnosis not present

## 2016-05-25 NOTE — Telephone Encounter (Signed)
Pt is returning call.  CB#[phone]/MW

## 2016-05-25 NOTE — Telephone Encounter (Signed)
-----   Message from Tamsen Roers, [place] sent at 05/25/2016  1:09 PM EDT ----- No sign of fracture in the left hand. Probably a ligamentous or joint capsule injury. Proceed with NSAID if need for discomfort and use taping or splinting for support. Recheck prn.

## 2016-05-25 NOTE — Telephone Encounter (Signed)
LMTCB

## 2016-05-25 NOTE — Progress Notes (Signed)
Patient: Debra Garza Female    DOB: 1980/04/21   36 y.o.   MRN: [id number] Visit Date: 05/25/2016  Today's Provider: Dortha Kern, PA   Chief Complaint  Patient presents with  . Hand Pain   Subjective:    Hand Pain   Incident onset: February. The incident occurred at home. The injury mechanism was a fall (hit hand on the outcropping of the wall). Pain location: left hand at 4th and 5th metacarpals. The quality of the pain is described as aching. Radiates to: 5th proximal phalange. The pain has been constant since the incident. Exacerbated by: when spreading fingers apart. She has tried ice, acetaminophen and NSAIDs (buddy tape made pain worse) for the symptoms.   Patient Active Problem List   Diagnosis Date Noted  . Adjustment disorder with depressed mood 12/16/2006  . Attention deficit hyperactivity disorder (ADHD), predominantly inattentive type 10/29/2005  . Acute onset aura migraine 10/29/2005   Past Surgical History:  Procedure Laterality Date  . NO PAST SURGERIES     Family History  Problem Relation Age of Onset  . Healthy Mother   . Healthy Father   . Healthy Sister   . Liver cancer Maternal Grandmother    Allergies  Allergen Reactions  . Augmentin [Amoxicillin-Pot Clavulanate] Hives  . Codeine     Other reaction(s): Vomiting  . Hydrocodone Other (See Comments)  . Penicillins Hives  . Sudafed [Pseudoephedrine Hcl]   . Tape      Previous Medications   BUPROPION (WELLBUTRIN XL) 150 MG 24 HR TABLET    Take one tablet by mouth each morning.   DEXTROAMPHETAMINE (DEXEDRINE SPANSULE) 10 MG 24 HR CAPSULE    TK 1 TO 3 CS PO D   ELETRIPTAN (RELPAX) 20 MG TABLET    Take by mouth.   EPINEPHRINE 0.3 MG/0.3 ML IJ SOAJ INJECTION    Inject 0.3 mLs (0.3 mg total) into the muscle once.   MIRTAZAPINE (REMERON) 15 MG TABLET    TK 1 T PO QHS   MULTIPLE VITAMIN PO       NUVARING 0.12-0.015 MG/24HR VAGINAL RING       PROAIR HFA 108 (90 BASE) MCG/ACT INHALER    INHALE 2  PUFFS INTO THE LUNGS EVERY 4 HOURS AS NEEDED FOR WHEEZING OR SHORTNESS OF BREATH   RANITIDINE (ZANTAC) 75 MG TABLET    Take by mouth.   TRAZODONE (DESYREL) 50 MG TABLET       VYVANSE 40 MG CAPSULE    Take 1 capsule (40 mg total) by mouth every morning.    Review of Systems  Constitutional: Negative.   Respiratory: Negative.   Cardiovascular: Negative.   Musculoskeletal:       Left hand and finger pain     Social History  Substance Use Topics  . Smoking status: Never Smoker  . Smokeless tobacco: Never Used  . Alcohol use 0.0 oz/week     Comment: occasionally   Objective:   BP 128/86 (BP Location: Right Arm, Patient Position: Sitting, Cuff Size: Normal)   Pulse (!) 105   Temp 98.2 F (36.8 C) (Oral)   Wt 108 lb 12.8 oz (49.4 kg)   SpO2 97%   BMI 16.54 kg/m   Physical Exam  Constitutional: She is oriented to person, place, and time. She appears well-developed and well-nourished. No distress.  HENT:  Head: Normocephalic and atraumatic.  Right Ear: Hearing normal.  Left Ear: Hearing normal.  Nose: Nose normal.  Eyes: Conjunctivae and lids  are normal. Right eye exhibits no discharge. Left eye exhibits no discharge. No scleral icterus.  Pulmonary/Chest: Effort normal. No respiratory distress.  Musculoskeletal: Normal range of motion. She exhibits tenderness.  Distal left 5th distal metacarpal tenderness without deformity. Normal sensation in fingers. No swelling.  Neurological: She is alert and oriented to person, place, and time.  Skin: Skin is intact. No lesion and no rash noted.  Psychiatric: She has a normal mood and affect. Her speech is normal and behavior is normal. Thought content normal.      Assessment & Plan:     1. Pain in left hand Tripped over a box at home and hit her left hand on a corner of the wall in her closet in Feb. 2018. Still has pain in the 5th MCP joint and distal metacarpal. No swelling or significant deformity. Recommend buddy taping for support.  Will get x-ray to rule out fracture that would be 34+ weeks old by now. Recheck prn. - DG Hand Complete Left

## 2016-05-25 NOTE — Telephone Encounter (Signed)
Patient is returning call.  CB# [phone]

## 2016-05-26 NOTE — Telephone Encounter (Signed)
Patient advised.

## 2016-06-01 ENCOUNTER — Encounter: Payer: Self-pay | Admitting: Obstetrics and Gynecology

## 2016-06-01 ENCOUNTER — Ambulatory Visit (INDEPENDENT_AMBULATORY_CARE_PROVIDER_SITE_OTHER): Payer: 59 | Admitting: Obstetrics and Gynecology

## 2016-06-01 VITALS — BP 124/78 | Ht 63.0 in | Wt 109.0 lb

## 2016-06-01 DIAGNOSIS — N3000 Acute cystitis without hematuria: Secondary | ICD-10-CM

## 2016-06-01 LAB — POCT URINALYSIS DIPSTICK
BILIRUBIN UA: NEGATIVE
GLUCOSE UA: NEGATIVE
Leukocytes, UA: NEGATIVE
NITRITE UA: NEGATIVE
PH UA: 6 (ref 5.0–8.0)
RBC UA: NEGATIVE
SPEC GRAV UA: 1.01 (ref 1.010–1.025)
Urobilinogen, UA: NEGATIVE E.U./dL — AB

## 2016-06-01 MED ORDER — NITROFURANTOIN MONOHYD MACRO 100 MG PO CAPS
100.0000 mg | ORAL_CAPSULE | Freq: Two times a day (BID) | ORAL | 0 refills | Status: AC
Start: 2016-06-01 — End: 2016-06-06

## 2016-06-01 NOTE — Progress Notes (Signed)
Chief Complaint  Patient presents with  . Urinary Tract Infection    pt c/o some dysuria and burning, frequency    HPI:      Ms. Debra Garza is a 36 y.o. G1P0010 who LMP was Patient's last menstrual period was 05/11/2016., presents today for UTI sx. She noted dysuria, urinary frequency and urgency and RT LBP since last night. She denies any fevers, hematuria, vag sx. She has a hx of UTIs in the past. She has taken AZO with some relief.     Patient Active Problem List   Diagnosis Date Noted  . Adjustment disorder with depressed mood 12/16/2006  . Attention deficit hyperactivity disorder (ADHD), predominantly inattentive type 10/29/2005  . Acute onset aura migraine 10/29/2005    Family History  Problem Relation Age of Onset  . Healthy Mother   . Cancer Mother     BASEL CELL 40  . Healthy Father   . Hyperlipidemia Father   . Cancer Father     BASEL CELL 40  . Healthy Sister   . Depression Sister   . Liver cancer Maternal Grandmother   . Cancer Maternal Grandmother     COLON    Social History   Social History  . Marital status: Single    Spouse name: N/A  . Number of children: 0  . Years of education: 43   Occupational History  . NURSE    Social History Main Topics  . Smoking status: Never Smoker  . Smokeless tobacco: Never Used  . Alcohol use 0.0 oz/week     Comment: occasionally  . Drug use: No  . Sexual activity: Yes    Birth control/ protection: Inserts   Other Topics Concern  . Not on file   Social History Narrative  . No narrative on file     Current Outpatient Prescriptions:  .  buPROPion (WELLBUTRIN XL) 150 MG 24 hr tablet, Take one tablet by mouth each morning., Disp: 30 tablet, Rfl: 6 .  dextroamphetamine (DEXEDRINE SPANSULE) 10 MG 24 hr capsule, TK 1 TO 3 CS PO D, Disp: , Rfl: 0 .  eletriptan (RELPAX) 20 MG tablet, Take by mouth., Disp: , Rfl:  .  EPINEPHrine 0.3 mg/0.3 mL IJ SOAJ injection, Inject 0.3 mLs (0.3 mg total) into the  muscle once., Disp: 1 Device, Rfl: 0 .  NUVARING 0.12-0.015 MG/24HR vaginal ring, , Disp: , Rfl: 11 .  PROAIR HFA 108 (90 Base) MCG/ACT inhaler, INHALE 2 PUFFS INTO THE LUNGS EVERY 4 HOURS AS NEEDED FOR WHEEZING OR SHORTNESS OF BREATH, Disp: 8.5 g, Rfl: 0 .  ranitidine (ZANTAC) 75 MG tablet, Take by mouth., Disp: , Rfl:  .  traZODone (DESYREL) 50 MG tablet, , Disp: , Rfl: 3 .  VYVANSE 40 MG capsule, Take 1 capsule (40 mg total) by mouth every morning., Disp: 30 capsule, Rfl: 0 .  mirtazapine (REMERON) 15 MG tablet, TK 1 T PO QHS, Disp: , Rfl: 12 .  MULTIPLE VITAMIN PO, , Disp: , Rfl:  .  nitrofurantoin, macrocrystal-monohydrate, (MACROBID) 100 MG capsule, Take 1 capsule (100 mg total) by mouth 2 (two) times daily., Disp: 10 capsule, Rfl: 0  Review of Systems  Constitutional: Negative for fever.  Gastrointestinal: Negative for blood in stool, constipation, diarrhea, nausea and vomiting.  Genitourinary: Positive for dysuria, frequency and urgency. Negative for dyspareunia, flank pain, hematuria, vaginal bleeding, vaginal discharge and vaginal pain.  Musculoskeletal: Positive for back pain.  Skin: Negative for rash.  OBJECTIVE:   Vitals:  BP 124/78   Ht 5\' 3"  (1.6 m)   Wt 109 lb (49.4 kg)   LMP 05/11/2016   BMI 19.31 kg/m   Physical Exam  Constitutional: She is oriented to person, place, and time. Vital signs are normal.  Abdominal: There is no CVA tenderness.  Neurological: She is alert and oriented to person, place, and time.  Vitals reviewed.   Results: Results for orders placed or performed in visit on 06/01/16 (from the past 24 hour(s))  POCT Urinalysis Dipstick     Status: Normal   Collection Time: 06/01/16  3:38 PM  Result Value Ref Range   Color, UA     Clarity, UA     Glucose, UA neg    Bilirubin, UA neg    Ketones, UA trace    Spec Grav, UA 1.010 1.010 - 1.025   Blood, UA neg    pH, UA 6.0 5.0 - 8.0   Protein, UA trace    Urobilinogen, UA negative (A) 0.2  or 1.0 E.U./dL   Nitrite, UA negative    Leukocytes, UA Negative Negative   DIPSTICK OBSCURED SOMEWHAT BY AZO   Assessment/Plan: Acute cystitis without hematuria - By clinical sx/hx. Rx macrobid. Check C&S. F/u prn.  - Plan: POCT Urinalysis Dipstick, Urine culture, nitrofurantoin, macrocrystal-monohydrate, (MACROBID) 100 MG capsule     Meds ordered this encounter  Medications  . nitrofurantoin, macrocrystal-monohydrate, (MACROBID) 100 MG capsule    Sig: Take 1 capsule (100 mg total) by mouth 2 (two) times daily.    Dispense:  10 capsule    Refill:  0      Return if symptoms worsen or fail to improve.  Analisia Kingsford B. Dorethea Strubel, PA-C 06/01/2016 3:40 PM

## 2016-06-03 LAB — URINE CULTURE: ORGANISM ID, BACTERIA: NO GROWTH

## 2016-08-10 ENCOUNTER — Other Ambulatory Visit: Payer: Self-pay

## 2016-08-10 MED ORDER — NUVARING 0.12-0.015 MG/24HR VA RING: VAGINAL_RING | VAGINAL | 2 refills | Status: DC

## 2016-08-10 NOTE — Telephone Encounter (Signed)
Pt called for refill of nuvaring to get her to her appt 9/6th.  Left detailed msgs refill eRx'd.

## 2016-08-12 DIAGNOSIS — F902 Attention-deficit hyperactivity disorder, combined type: Secondary | ICD-10-CM | POA: Diagnosis not present

## 2016-09-10 DIAGNOSIS — F902 Attention-deficit hyperactivity disorder, combined type: Secondary | ICD-10-CM | POA: Diagnosis not present

## 2016-09-28 NOTE — Progress Notes (Signed)
Gynecology Annual Exam  PCP: Tamsen Roers, [place]  Chief Complaint:  Chief Complaint  Patient presents with  . Gynecologic Exam    History of Present Illness:Debra Garza is a 36 year old Caucasian/White female , G 1 P 0 0 1 0 , who presents for her annual exam . She has not had any gynecological problems.  Her menses are regular and her LMP was 09/17/2016 . They occur every 28 days , they last 5-6 days , are medium flow, and are without clots.  She has had no spotting.  She reports dysmenorrhea. She uses [employer] with symptomatic relief.  The patient's past medical history is notable for a history of ADD, depression, asthma, migraines.  Since her last annual GYN exam dated 08/12/2015 , she complains  of recurrent UTI symptoms. Has had UTI symptoms 3-4 times this past year. Sometimes the burning with urination follows intercourse and sometimes it follows not drinking enough fluids at work and going long times without urination. Took antibiotics on one occasion, but symptoms usually resolved with drinking cranberry juice.  DOes not have any UTI symptoms currently. 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  She is sexually active and in a M/M relationship. She is currently using the Nuvaring for contraception.  Her most recent pap smear was obtained 08/12/2015 and was negative.  Mammogram is not applicable.  There is no family history of breast cancer.  There is no family history of ovarian cancer.  The patient does not do monthly self breast exams.  The patient does not smoke.  The patient does drink does drink 1-2 beer/week..  The patient does not use illegal drugs.  The patient does not exercise.  The patient does get adequate calcium in her diet.  She had a recent cholesterol screen at work in 2016 that was normal.      Review of Systems: Review of Systems  Constitutional: Positive for weight loss. Negative for chills and fever.  HENT: Negative for congestion,  sinus pain and sore throat.   Eyes: Negative for blurred vision and pain.  Respiratory: Negative for hemoptysis, shortness of breath and wheezing.   Cardiovascular: Negative for chest pain, palpitations and leg swelling.  Gastrointestinal: Negative for abdominal pain, blood in stool, diarrhea, heartburn, nausea and vomiting.  Genitourinary: Negative for dysuria, frequency, hematuria and urgency.  Musculoskeletal: Negative for back pain, joint pain and myalgias.  Skin: Negative for itching and rash.  Neurological: Positive for headaches. Negative for dizziness and tingling.  Endo/Heme/Allergies: Negative for environmental allergies and polydipsia. Does not bruise/bleed easily.       Negative for hirsutism   Psychiatric/Behavioral: Negative for depression. The patient is not nervous/anxious and does not have insomnia.     Past Medical History:  Past Medical History:  Diagnosis Date  . ADD (attention deficit disorder)   . Anxiety   . Asthma   . Depression    DR. KAUR  . Insomnia   . Migraine   . Other abnormal glucose    SEASONAL ALLERGIES    Past Surgical History:  Past Surgical History:  Procedure Laterality Date  . NO PAST SURGERIES      Family History:  Family History  Problem Relation Age of Onset  . Healthy Mother   . Cancer Mother        BASEL CELL 40  . Healthy Father   . Hyperlipidemia Father   . Cancer Father  BASEL CELL 40  . Healthy Sister   . Depression Sister   . Liver cancer Maternal Grandmother   . Cancer Maternal Grandmother        COLON    Social History:  Social History   Social History  . Marital status: Single    Spouse name: N/A  . Number of children: 0  . Years of education: 4016   Occupational History  . NURSE    Social History Main Topics  . Smoking status: Never Smoker  . Smokeless tobacco: Never Used  . Alcohol use 0.6 - 1.2 oz/week    1 - 2 Cans of beer per week     Comment: occasionally  . Drug use: No  . Sexual  activity: Yes    Partners: Male    Birth control/ protection: Inserts   Other Topics Concern  . Not on file   Social History Narrative  . No narrative on file    Allergies:  Allergies  Allergen Reactions  . Augmentin [Amoxicillin-Pot Clavulanate] Hives  . Codeine     Other reaction(s): Vomiting  . Hydrocodone Other (See Comments)  . Penicillins Hives  . Sudafed [Pseudoephedrine Hcl]   . Tape     Medications: Prior to Admission medications   Medication Sig Start Date End Date Taking? Authorizing Provider  buPROPion (WELLBUTRIN XL) 150 MG 24 hr tablet Take one tablet by mouth each morning. 12/15/15   Chrismon, Jodell Ciproennis E, PA  dextroamphetamine (DEXEDRINE SPANSULE) 10 MG 24 hr capsule TK 1 TO 3 CS PO D 03/29/15   [provider]  eletriptan (RELPAX) 20 MG tablet Take by mouth. 06/26/12   [provider]  EPINEPHrine 0.3 mg/0.3 mL IJ SOAJ injection Inject 0.3 mLs (0.3 mg total) into the muscle once. 06/13/15   Chrismon, Jodell Ciproennis E, PA         MULTIPLE VITAMIN PO  06/07/06   [provider]  NUVARING 0.12-0.015 MG/24HR vaginal ring Insert nuvaring vaginally, leave in for three weeks, remove for one week for menses.  Repeat 08/10/16   Farrel ConnersGutierrez, Laquinta Hazell, CNM  PROAIR HFA 108 [phone](90 Base) MCG/ACT inhaler INHALE 2 PUFFS INTO THE LUNGS EVERY 4 HOURS AS NEEDED FOR WHEEZING OR SHORTNESS OF BREATH 06/29/15   Chrismon, Jodell Ciproennis E, PA  ranitidine (ZANTAC) 75 MG tablet Take by mouth.    [provider]  traZODone (DESYREL) 50 MG tablet  05/20/16   [provider]  VYVANSE 40 MG capsule Take 1 capsule (40 mg total) by mouth every morning. 01/10/16   Chrismon, Jodell Ciproennis E, PA    Physical Exam Vitals: BP 110/76   Pulse 91   Ht 5\' 3"  (1.6 m)   Wt 103 lb (46.7 kg)   LMP 09/17/2016   BMI 18.25 kg/m   General: WF in NAD HEENT: normocephalic, anicteric Neck: no thyroid enlargement, no palpable nodules, no cervical lymphadenopathy  Pulmonary: No increased work of  breathing, CTAB Cardiovascular: RRR, without murmur  Breast: Breast symmetrical, no tenderness, no palpable nodules or masses, no skin or nipple retraction present, no nipple discharge.  No axillary, infraclavicular or supraclavicular lymphadenopathy. Abdomen: Soft, non-tender, non-distended.  Umbilicus without lesions.  No hepatomegaly or masses palpable. No evidence of hernia. Genitourinary:  External: Normal external female genitalia.  Normal urethral meatus, normal Bartholin's and Skene's glands.    Vagina: Normal vaginal mucosa, no evidence of prolapse, Nuvaring in place.    Cervix: Grossly normal in appearance, friable endocervix with Pap,, non-tender  Uterus: Anteflexed, normal size,  shape, and consistency, mobile, and non-tender  Adnexa: No adnexal masses, non-tender  Rectal: deferred  Lymphatic: no evidence of inguinal lymphadenopathy Extremities: no edema, erythema, or tenderness Neurologic: Grossly intact Psychiatric: mood appropriate, affect full     Assessment: 36 y.o. G1P0010 annual gyn exam Recurrent UTI symptoms  Plan:    1) Breast cancer screening - recommend monthly self breast exam.   2) DIscussed increasing water intake, decreasing caffeine intake, washing and urinating after intercourse. RX for macrobid for use after IC if needed  3) Cervical cancer screening - Pap was done. ASCCP guidelines and rational discussed.  Patient opts for yearly screening interval  4) Contraception - Refill Nuvaring x 1 year  5) Routine healthcare maintenance including cholesterol and diabetes screening UTD   Farrel Conners, CNM

## 2016-09-30 ENCOUNTER — Ambulatory Visit (INDEPENDENT_AMBULATORY_CARE_PROVIDER_SITE_OTHER): Payer: 59 | Admitting: Certified Nurse Midwife

## 2016-09-30 ENCOUNTER — Encounter: Payer: Self-pay | Admitting: Certified Nurse Midwife

## 2016-09-30 VITALS — BP 110/76 | HR 91 | Ht 63.0 in | Wt 103.0 lb

## 2016-09-30 DIAGNOSIS — Z01419 Encounter for gynecological examination (general) (routine) without abnormal findings: Secondary | ICD-10-CM

## 2016-09-30 DIAGNOSIS — Z3044 Encounter for surveillance of vaginal ring hormonal contraceptive device: Secondary | ICD-10-CM

## 2016-09-30 DIAGNOSIS — Z124 Encounter for screening for malignant neoplasm of cervix: Secondary | ICD-10-CM | POA: Diagnosis not present

## 2016-09-30 MED ORDER — NUVARING 0.12-0.015 MG/24HR VA RING: VAGINAL_RING | VAGINAL | 3 refills | Status: DC

## 2016-09-30 MED ORDER — NITROFURANTOIN MONOHYD MACRO 100 MG PO CAPS: ORAL_CAPSULE | ORAL | 0 refills | Status: DC

## 2016-10-02 LAB — IGP, APTIMA HPV
HPV Aptima: NEGATIVE
PAP Smear Comment: 0

## 2016-12-09 DIAGNOSIS — F902 Attention-deficit hyperactivity disorder, combined type: Secondary | ICD-10-CM | POA: Diagnosis not present

## 2017-01-06 ENCOUNTER — Other Ambulatory Visit (INDEPENDENT_AMBULATORY_CARE_PROVIDER_SITE_OTHER): Payer: Self-pay

## 2017-01-06 ENCOUNTER — Encounter: Payer: Self-pay | Admitting: Family Medicine

## 2017-01-06 MED ORDER — NITROFURANTOIN MONOHYD MACRO 100 MG PO CAPS: ORAL_CAPSULE | ORAL | 0 refills | Status: DC

## 2017-01-06 NOTE — Telephone Encounter (Signed)
Please advise 

## 2017-01-11 ENCOUNTER — Encounter: Payer: Self-pay | Admitting: Family Medicine

## 2017-02-14 ENCOUNTER — Encounter: Payer: Self-pay | Admitting: Family Medicine

## 2017-03-03 DIAGNOSIS — F902 Attention-deficit hyperactivity disorder, combined type: Secondary | ICD-10-CM | POA: Diagnosis not present

## 2017-03-24 ENCOUNTER — Encounter: Payer: Self-pay | Admitting: Family Medicine

## 2017-04-05 ENCOUNTER — Other Ambulatory Visit: Payer: Self-pay | Admitting: Family Medicine

## 2017-04-05 ENCOUNTER — Ambulatory Visit (INDEPENDENT_AMBULATORY_CARE_PROVIDER_SITE_OTHER): Payer: 59 | Admitting: Family Medicine

## 2017-04-05 ENCOUNTER — Encounter: Payer: Self-pay | Admitting: Family Medicine

## 2017-04-05 VITALS — BP 120/76 | HR 88 | Temp 98.0°F | Resp 16 | Ht 63.0 in | Wt 108.0 lb

## 2017-04-05 DIAGNOSIS — F900 Attention-deficit hyperactivity disorder, predominantly inattentive type: Secondary | ICD-10-CM

## 2017-04-05 DIAGNOSIS — Z020 Encounter for examination for admission to educational institution: Secondary | ICD-10-CM

## 2017-04-05 NOTE — Progress Notes (Signed)
Patient: Debra SchullerBrittany L Garza Female    DOB: 03-25-1980   37 y.o.   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 Patient feels well today with no other complaints.     Past Medical History:  Diagnosis Date  . ADD (attention deficit disorder)   . Anxiety   . Asthma   . Depression    DR. KAUR  . Insomnia   . Migraine   . Other abnormal glucose    SEASONAL ALLERGIES   Past Surgical History:  Procedure Laterality Date  . NO PAST SURGERIES     Family History  Problem Relation Age of Onset  . Healthy Mother   . Cancer Mother        BASEL CELL 40  . Healthy Father   . Hyperlipidemia Father   . Cancer Father        BASEL CELL 40  . Healthy Sister   . Depression Sister   . Liver cancer Maternal Grandmother   . Cancer Maternal Grandmother        COLON   Allergies  Allergen Reactions  . Augmentin [Amoxicillin-Pot Clavulanate] Hives  . Codeine     Other reaction(s): Vomiting  . Hydrocodone Other (See Comments)  . Penicillins Hives  . Sudafed [Pseudoephedrine Hcl]   . Tape     Current Outpatient Medications:  .  buPROPion (WELLBUTRIN XL) 150 MG 24 hr tablet, Take one tablet by mouth each morning., Disp: 30 tablet, Rfl: 6 .  dextroamphetamine (DEXEDRINE SPANSULE) 10 MG 24 hr capsule, TK 1 TO 3 CS PO D, Disp: , Rfl: 0 .  eletriptan (RELPAX) 20 MG tablet, Take by mouth., Disp: , Rfl:  .  EPINEPHrine 0.3 mg/0.3 mL IJ SOAJ injection, Inject 0.3 mLs (0.3 mg total) into the muscle once., Disp: 1 Device, Rfl: 0 .  nitrofurantoin, macrocrystal-monohydrate, (MACROBID) 100 MG capsule, Take one after intercourse prn or take one BID x 5 days prn burning with urination, Disp: 30 capsule, Rfl: 0 .  NUVARING 0.12-0.015 MG/24HR vaginal ring, Insert nuvaring vaginally, leave in for three weeks, remove for one week for menses.  Repeat, Disp: 3 each, Rfl: 3 .   PROAIR HFA 108 (90 Base) MCG/ACT inhaler, INHALE 2 PUFFS INTO THE LUNGS EVERY 4 HOURS AS NEEDED FOR WHEEZING OR SHORTNESS OF BREATH, Disp: 8.5 g, Rfl: 0 .  ranitidine (ZANTAC) 75 MG tablet, Take by mouth., Disp: , Rfl:  .  traZODone (DESYREL) 50 MG tablet, , Disp: , Rfl: 3 .  VYVANSE 50 MG capsule, , Disp: , Rfl: 0  Review of Systems  Constitutional: Negative.   HENT: Negative.   Eyes: Negative.   Respiratory: Negative.   Cardiovascular: Negative.   Gastrointestinal: Negative.   Genitourinary: Negative.   Musculoskeletal: Negative.   Psychiatric/Behavioral: Negative.     Social History   Tobacco Use  . Smoking status: Never Smoker  . Smokeless tobacco: Never Used  Substance Use Topics  . Alcohol use: Yes    Alcohol/week: 0.6 - 1.2 oz    Types: 1 - 2 Cans of beer per week    Comment: occasionally   Objective:   BP 120/76 (BP Location: Right Arm, Patient Position: Sitting, Cuff Size: Normal)   Pulse 88   Temp 98 F (36.7 C)   Resp 16   Ht 5\' 3"  (1.6 m) Comment:  per patient  Wt 108 lb (49 kg) Comment: per patient  SpO2 99%   BMI 19.13 kg/m   Physical Exam  Constitutional: She is oriented to person, place, and time. She appears well-developed and well-nourished.  HENT:  Head: Normocephalic and atraumatic.  Right Ear: External ear normal.  Left Ear: External ear normal.  Nose: Nose normal.  Mouth/Throat: Oropharynx is clear and moist.  Eyes: Conjunctivae and EOM are normal. Pupils are equal, round, and reactive to light. Right eye exhibits no discharge.  Neck: Normal range of motion. Neck supple. No tracheal deviation present. No thyromegaly present.  Cardiovascular: Normal rate, regular rhythm, normal heart sounds and intact distal pulses.  No murmur heard. Pulmonary/Chest: Effort normal and breath sounds normal. No respiratory distress. She has no wheezes. She has no rales. She exhibits no tenderness.  Abdominal: Soft. She exhibits no distension and no mass. There is  no tenderness. There is no rebound and no guarding.  Musculoskeletal: Normal range of motion. She exhibits no edema or tenderness.  Lymphadenopathy:    She has no cervical adenopathy.  Neurological: She is alert and oriented to person, place, and time. She has normal reflexes. No cranial nerve deficit. She exhibits normal muscle tone. Coordination normal.  Skin: Skin is warm and dry. No rash noted. No erythema.  Psychiatric: She has a normal mood and affect. Her behavior is normal. Judgment and thought content normal.      Assessment & Plan:     1. Encounter for school history and physical examination General health stable. Completed physical examination form and immunizations. Need Hgb/Hct with Tb blood test.  - Quantiferon tb gold assay - CBC with Differential/Platelet  2. Attention deficit hyperactivity disorder (ADHD), predominantly inattentive type Stable and well controlled by medications followed by psychiatrist regularly.       Dortha Kern, PA  [facility]

## 2017-04-06 LAB — CBC WITH DIFFERENTIAL/PLATELET
BASOS ABS: 0 10*3/uL (ref 0.0–0.2)
Basos: 1 %
EOS (ABSOLUTE): 0.3 10*3/uL (ref 0.0–0.4)
Eos: 4 %
Hematocrit: 43 % (ref 34.0–46.6)
Hemoglobin: 14.3 g/dL (ref 11.1–15.9)
Immature Grans (Abs): 0 10*3/uL (ref 0.0–0.1)
Immature Granulocytes: 0 %
LYMPHS ABS: 2.9 10*3/uL (ref 0.7–3.1)
Lymphs: 40 %
MCH: 29.4 pg (ref 26.6–33.0)
MCHC: 33.3 g/dL (ref 31.5–35.7)
MCV: 88 fL (ref 79–97)
MONOS ABS: 0.8 10*3/uL (ref 0.1–0.9)
Monocytes: 12 %
NEUTROS ABS: 3.1 10*3/uL (ref 1.4–7.0)
Neutrophils: 43 %
Platelets: 291 10*3/uL (ref 150–379)
RBC: 4.87 x10E6/uL (ref 3.77–5.28)
RDW: 12.8 % (ref 12.3–15.4)
WBC: 7.1 10*3/uL (ref 3.4–10.8)

## 2017-04-09 LAB — QUANTIFERON-TB GOLD PLUS
QUANTIFERON NIL VALUE: 0.04 [IU]/mL
QUANTIFERON TB1 AG VALUE: 0.1 [IU]/mL
QUANTIFERON TB2 AG VALUE: 0.12 [IU]/mL
QUANTIFERON-TB GOLD PLUS: NEGATIVE
QuantiFERON Mitogen Value: >10 IU/mL

## 2017-04-12 ENCOUNTER — Other Ambulatory Visit: Payer: Self-pay | Admitting: Obstetrics and Gynecology

## 2017-04-12 MED ORDER — NITROFURANTOIN MONOHYD MACRO 100 MG PO CAPS: ORAL_CAPSULE | ORAL | 0 refills | Status: DC

## 2017-04-12 NOTE — Telephone Encounter (Signed)
Please advise 

## 2017-05-26 DIAGNOSIS — F902 Attention-deficit hyperactivity disorder, combined type: Secondary | ICD-10-CM | POA: Diagnosis not present

## 2017-05-30 ENCOUNTER — Encounter: Payer: Self-pay | Admitting: Family Medicine

## 2017-06-02 ENCOUNTER — Encounter: Payer: Self-pay | Admitting: Certified Nurse Midwife

## 2017-06-07 ENCOUNTER — Encounter: Payer: Self-pay | Admitting: Family Medicine

## 2017-06-16 ENCOUNTER — Encounter: Payer: Self-pay | Admitting: Family Medicine

## 2017-06-29 ENCOUNTER — Encounter: Payer: Self-pay | Admitting: Family Medicine

## 2017-06-30 NOTE — Addendum Note (Signed)
Addended by: Dortha KernHRISMON, DENNIS E on: 06/30/2017 08:10 AM   Modules accepted: Level of Service

## 2017-08-18 DIAGNOSIS — F902 Attention-deficit hyperactivity disorder, combined type: Secondary | ICD-10-CM | POA: Diagnosis not present

## 2017-09-20 DIAGNOSIS — F902 Attention-deficit hyperactivity disorder, combined type: Secondary | ICD-10-CM | POA: Diagnosis not present

## 2017-09-28 ENCOUNTER — Other Ambulatory Visit: Payer: Self-pay | Admitting: Obstetrics and Gynecology

## 2017-09-28 DIAGNOSIS — H5202 Hypermetropia, left eye: Secondary | ICD-10-CM | POA: Diagnosis not present

## 2017-09-29 MED ORDER — NITROFURANTOIN MONOHYD MACRO 100 MG PO CAPS: ORAL_CAPSULE | ORAL | 0 refills | Status: DC

## 2017-09-29 NOTE — Telephone Encounter (Signed)
Called pt and left vm. Rx request was approved.

## 2017-09-29 NOTE — Telephone Encounter (Signed)
Please advise 

## 2017-10-04 ENCOUNTER — Ambulatory Visit (INDEPENDENT_AMBULATORY_CARE_PROVIDER_SITE_OTHER): Payer: 59 | Admitting: Certified Nurse Midwife

## 2017-10-04 ENCOUNTER — Encounter: Payer: Self-pay | Admitting: Certified Nurse Midwife

## 2017-10-04 ENCOUNTER — Other Ambulatory Visit (HOSPITAL_COMMUNITY)
Admission: RE | Admit: 2017-10-04 | Discharge: 2017-10-04 | Disposition: A | Discharge status: Home / Self Care | Payer: 59 | Source: Ambulatory Visit | Attending: Obstetrics and Gynecology | Admitting: Obstetrics and Gynecology

## 2017-10-04 VITALS — BP 128/78 | HR 117 | Ht 63.0 in | Wt 101.0 lb

## 2017-10-04 DIAGNOSIS — Z01419 Encounter for gynecological examination (general) (routine) without abnormal findings: Secondary | ICD-10-CM | POA: Insufficient documentation

## 2017-10-04 DIAGNOSIS — Z3044 Encounter for surveillance of vaginal ring hormonal contraceptive device: Secondary | ICD-10-CM

## 2017-10-04 DIAGNOSIS — H532 Diplopia: Secondary | ICD-10-CM | POA: Insufficient documentation

## 2017-10-04 DIAGNOSIS — Z124 Encounter for screening for malignant neoplasm of cervix: Secondary | ICD-10-CM

## 2017-10-04 MED ORDER — NUVARING 0.12-0.015 MG/24HR VA RING: VAGINAL_RING | VAGINAL | 3 refills | Status: DC

## 2017-10-04 NOTE — Progress Notes (Signed)
Gynecology Annual Exam  PCP: Tamsen Roers, [place]  Chief Complaint:  Chief Complaint  Patient presents with  . Gynecologic Exam    History of Present Illness:Debra Garza is a 37 year old Caucasian/White female , G 1 P 0 0 1 0 , who presents for her annual exam . She has not had any gynecological problems.  Her menses are regular and her LMP was 09/15/17 . They occur every 28 days , they last 5 days , are medium flow, and are without clots.  She has had no spotting.  She reports dysmenorrhea with some menses. She uses [employer] with symptomatic relief.  The patient's past medical history is notable for a history of ADD, depression, asthma,anxiety, migraines.  Since her last annual GYN exam dated 09/30/2016 , she reports not having to take any medication for a UTI since starting to take a Macrobid after intercourse prophylacticaly. She is having problems with diplopia and is scheduled to see a Information systems manager. She is working as an Charity fundraiser at [employer] and she has gone back to school to get her BSN [employer]) She is sexually active and in a M/M relationship. She is currently using the Nuvaring for contraception.  Her most recent pap smear was obtained 09/30/2016 and was NIL/negative HRHPV.  Mammogram is not applicable.  There is no family history of breast cancer.  There is no family history of ovarian cancer.  The patient does do self breast exams since having her nipples pierced this past year. The patient does not smoke.  The patient does drink does drink 2 beer/week..  The patient does not use illegal drugs.  The patient does some exercise (yoga).  The patient does get adequate calcium in her diet.  She had a recent cholesterol screen at work in 2016 that was normal.      Review of Systems: Review of Systems  Constitutional: Positive for weight loss. Negative for chills and fever.  HENT: Negative for congestion, sinus pain and sore throat.   Eyes: Positive for  double vision. Negative for blurred vision and pain.  Respiratory: Negative for hemoptysis, shortness of breath and wheezing.   Cardiovascular: Negative for chest pain, palpitations and leg swelling.  Gastrointestinal: Negative for abdominal pain, blood in stool, diarrhea, heartburn, nausea and vomiting.  Genitourinary: Negative for dysuria, frequency, hematuria and urgency.  Musculoskeletal: Negative for back pain, joint pain and myalgias.  Skin: Negative for itching and rash.  Neurological: Negative for dizziness, tingling and headaches.  Endo/Heme/Allergies: Negative for environmental allergies and polydipsia. Does not bruise/bleed easily.       Negative for hirsutism   Psychiatric/Behavioral: Positive for depression. The patient is nervous/anxious. The patient does not have insomnia.     Past Medical History:  Past Medical History:  Diagnosis Date  . ADD (attention deficit disorder)   . Anxiety   . Asthma   . Depression    DR. KAUR  . Insomnia   . Migraine   . Other abnormal glucose    SEASONAL ALLERGIES  . Panic attacks   . Strabismic amblyopia of right eye     Past Surgical History:  Past Surgical History:  Procedure Laterality Date  . NO PAST SURGERIES      Family History:  Family History  Problem Relation Age of Onset  . Healthy Mother   . Cancer Mother        BASEL CELL 40  . Healthy Father   . Hyperlipidemia  Father   . Cancer Father        BASEL CELL 40  . Healthy Sister   . Depression Sister   . Liver cancer Maternal Grandmother   . Cancer Maternal Grandmother        COLON    Social History:  Social History   Socioeconomic History  . Marital status: Single    Spouse name: Not on file  . Number of children: 0  . Years of education: 8  . Highest education level: Not on file  Occupational History  . Occupation: NURSE  Social Needs  . Financial resource strain: Not on file  . Food insecurity:    Worry: Not on file    Inability: Not on file    . Transportation needs:    Medical: Not on file    Non-medical: Not on file  Tobacco Use  . Smoking status: Never Smoker  . Smokeless tobacco: Never Used  Substance and Sexual Activity  . Alcohol use: Yes    Alcohol/week: 1.0 - 2.0 standard drinks    Types: 1 - 2 Cans of beer per week    Comment: occasionally  . Drug use: No  . Sexual activity: Yes    Partners: Male    Birth control/protection: Inserts  Lifestyle  . Physical activity:    Days per week: Not on file    Minutes per session: Not on file  . Stress: Not on file  Relationships  . Social connections:    Talks on phone: Not on file    Gets together: Not on file    Attends religious service: Not on file    Active member of club or organization: Not on file    Attends meetings of clubs or organizations: Not on file    Relationship status: Not on file  . Intimate partner violence:    Fear of current or ex partner: Not on file    Emotionally abused: Not on file    Physically abused: Not on file    Forced sexual activity: Not on file  Other Topics Concern  . Not on file  Social History Narrative  . Not on file    Allergies:  Allergies  Allergen Reactions  . Banana Anaphylaxis  . Other Anaphylaxis    Chickpeas  . Augmentin [Amoxicillin-Pot Clavulanate] Hives  . Codeine     Other reaction(s): Vomiting  . Hydrocodone Other (See Comments)  . Penicillins Hives  . Sudafed [Pseudoephedrine Hcl]   . Tape     Medications:  Current Outpatient Medications on File Prior to Visit  Medication Sig Dispense Refill  . buPROPion (WELLBUTRIN XL) 150 MG 24 hr tablet Take one tablet by mouth each morning. 30 tablet 6  . dextroamphetamine (DEXEDRINE SPANSULE) 10 MG 24 hr capsule TK 1 TO 3 CS PO D  0  . diazepam (VALIUM) 5 MG tablet Take by mouth.    . eletriptan (RELPAX) 20 MG tablet Take by mouth.    . EPINEPHrine 0.3 mg/0.3 mL IJ SOAJ injection Inject 0.3 mLs (0.3 mg total) into the muscle once. 1 Device 0  .  nitrofurantoin, macrocrystal-monohydrate, (MACROBID) 100 MG capsule Take one after intercourse prn or take one BID x 5 days prn burning with urination 30 capsule 0  . PROAIR HFA 108 (90 Base) MCG/ACT inhaler INHALE 2 PUFFS INTO THE LUNGS EVERY 4 HOURS AS NEEDED FOR WHEEZING OR SHORTNESS OF BREATH 8.5 g 0  . ranitidine (ZANTAC) 75 MG tablet Take by mouth.    Marland Kitchen  traZODone (DESYREL) 50 MG tablet   3  . vortioxetine HBr (TRINTELLIX) 5 MG TABS tablet Take 5 mg by mouth daily.    Marland Kitchen VYVANSE 50 MG capsule   0   No current facility-administered medications on file prior to visit.         Physical Exam Vitals: BP 128/78   Pulse (!) 117   Ht 5\' 3"  (1.6 m)   Wt 101 lb (45.8 kg)   LMP 09/15/2017 (Exact Date)   BMI 17.89 kg/m  General: WF in NAD HEENT: normocephalic, anicteric Neck: no thyroid enlargement, no palpable nodules, no cervical lymphadenopathy  Pulmonary: No increased work of breathing, CTAB Cardiovascular: RRR, without murmur  Breast: Breast symmetrical, no tenderness, no palpable nodules or masses, no skin or nipple retraction present, no nipple discharge. Nipples pierced bilaterally.  No axillary, infraclavicular or supraclavicular lymphadenopathy. Abdomen: Soft, non-tender, non-distended.  Umbilicus pierced.  No hepatomegaly or masses palpable. No evidence of hernia. Genitourinary:  External: Normal external female genitalia.  Normal urethral meatus, normal Bartholin's and Skene's glands.    Vagina: Normal vaginal mucosa, no evidence of prolapse, Nuvaring in place.    Cervix: Grossly normal in appearance, friable endocervix with Pap,, non-tender  Uterus: Anteflexed, normal size, shape, and consistency, mobile, and non-tender  Adnexa: No adnexal masses, non-tender  Rectal: deferred  Lymphatic: no evidence of inguinal lymphadenopathy Extremities: no edema, erythema, or tenderness Neurologic: Grossly intact Psychiatric: mood appropriate, affect full     Assessment: 37 y.o.  G1P0010 annual gyn exam  Plan:    1) Breast cancer screening - recommend monthly self breast exam.   2) DIscussed increasing water intake, decreasing caffeine intake, washing and urinating after intercourse. Continue macrobid for use after IC if needed  3) Cervical cancer screening - Pap was done. ASCCP guidelines and rational discussed.  Patient opts for yearly screening interval  4) Contraception - Refill Nuvaring x 1 year  5) Routine healthcare maintenance including cholesterol and diabetes screening UTD   6) RTO 1 year and prn   Farrel Conners, CNM

## 2017-10-06 LAB — CYTOLOGY - PAP: Diagnosis: NEGATIVE

## 2017-10-12 DIAGNOSIS — H5000 Unspecified esotropia: Secondary | ICD-10-CM | POA: Diagnosis not present

## 2017-10-12 DIAGNOSIS — H532 Diplopia: Secondary | ICD-10-CM | POA: Diagnosis not present

## 2017-10-12 DIAGNOSIS — G43101 Migraine with aura, not intractable, with status migrainosus: Secondary | ICD-10-CM | POA: Diagnosis not present

## 2017-10-12 DIAGNOSIS — H5032 Intermittent alternating esotropia: Secondary | ICD-10-CM | POA: Insufficient documentation

## 2017-10-25 ENCOUNTER — Encounter: Payer: Self-pay | Admitting: Family Medicine

## 2017-10-27 ENCOUNTER — Other Ambulatory Visit: Payer: Self-pay | Admitting: Family Medicine

## 2017-10-27 MED ORDER — ELETRIPTAN HYDROBROMIDE 20 MG PO TABS: ORAL_TABLET | ORAL | 1 refills | Status: DC

## 2017-11-02 DIAGNOSIS — H5000 Unspecified esotropia: Secondary | ICD-10-CM | POA: Diagnosis not present

## 2017-11-02 DIAGNOSIS — H532 Diplopia: Secondary | ICD-10-CM | POA: Diagnosis not present

## 2017-11-09 ENCOUNTER — Encounter: Payer: Self-pay | Admitting: Emergency Medicine

## 2017-11-09 DIAGNOSIS — F902 Attention-deficit hyperactivity disorder, combined type: Secondary | ICD-10-CM | POA: Insufficient documentation

## 2017-11-09 DIAGNOSIS — F419 Anxiety disorder, unspecified: Secondary | ICD-10-CM | POA: Insufficient documentation

## 2017-11-09 DIAGNOSIS — G4700 Insomnia, unspecified: Secondary | ICD-10-CM | POA: Insufficient documentation

## 2017-11-11 ENCOUNTER — Other Ambulatory Visit: Payer: Self-pay | Admitting: Family Medicine

## 2017-11-11 ENCOUNTER — Telehealth: Payer: Self-pay | Admitting: Family Medicine

## 2017-11-11 DIAGNOSIS — G43109 Migraine with aura, not intractable, without status migrainosus: Secondary | ICD-10-CM

## 2017-11-11 MED ORDER — RIZATRIPTAN BENZOATE 10 MG PO TABS
10.0000 mg | ORAL_TABLET | ORAL | 0 refills | Status: AC | PRN
Start: 2017-11-11 — End: ?

## 2017-11-11 NOTE — Telephone Encounter (Signed)
May try these for migraine. They are in the same class of drug as the Relpax. Let us know which pharmacy to use if you want to do this.

## 2017-11-11 NOTE — Telephone Encounter (Signed)
Pt uses [facility] outpatient pharmacy.  [place], cma

## 2017-11-11 NOTE — Telephone Encounter (Signed)
Sent Maxalt (generic) to the [facility] pharmacy. Report response in 3-4 weeks.

## 2017-11-11 NOTE — Telephone Encounter (Signed)
Patient cannot get her Relpax RX because she needs to have failed Maxalt and Imitrex first.     She says she cannot go without some type of migraine medication.  She is not sure what to do from this point and asking what she can do????

## 2017-11-14 NOTE — Telephone Encounter (Signed)
Left vm advising the rx sent to pharmacy and dennis would like a response in 3 to 4 weeks.  dbs,cma

## 2017-11-15 ENCOUNTER — Ambulatory Visit: Payer: 59 | Admitting: Psychiatry

## 2017-11-15 ENCOUNTER — Encounter: Payer: Self-pay | Admitting: Psychiatry

## 2017-11-15 VITALS — BP 137/88 | HR 94

## 2017-11-15 DIAGNOSIS — F330 Major depressive disorder, recurrent, mild: Secondary | ICD-10-CM

## 2017-11-15 DIAGNOSIS — F419 Anxiety disorder, unspecified: Secondary | ICD-10-CM | POA: Diagnosis not present

## 2017-11-15 DIAGNOSIS — F5101 Primary insomnia: Secondary | ICD-10-CM | POA: Diagnosis not present

## 2017-11-15 DIAGNOSIS — F902 Attention-deficit hyperactivity disorder, combined type: Secondary | ICD-10-CM | POA: Diagnosis not present

## 2017-11-15 MED ORDER — VORTIOXETINE HBR 5 MG PO TABS: 5.0000 mg | ORAL_TABLET | Freq: Every day | ORAL | 1 refills | Status: DC

## 2017-11-15 MED ORDER — BUPROPION HCL ER (XL) 150 MG PO TB24
150.0000 mg | ORAL_TABLET | Freq: Every day | ORAL | 1 refills | Status: DC
Start: 2017-11-15 — End: 2018-03-07

## 2017-11-15 MED ORDER — TRAZODONE HCL 50 MG PO TABS: ORAL_TABLET | ORAL | 1 refills | Status: DC

## 2017-11-15 MED ORDER — DEXTROAMPHETAMINE SULFATE ER 10 MG PO CP24: ORAL_CAPSULE | ORAL | 0 refills | Status: DC

## 2017-11-15 NOTE — Progress Notes (Signed)
Debra Garza [id number] March 17, 1980 37 y.o.  Subjective:   Patient ID:  Debra Garza is a 37 y.o. (DOB 16-Apr-1980) female.  Chief Complaint:  Chief Complaint  Patient presents with  . Follow-up    ADD, Depression    HPI Debra Garza presents to the office today for follow-up of mood, anxiety, and ADD. Reports that she was told that she needs eye surgery and is anxious about this. She reports that she was told she needed an MRI to r/o a brain tumor and this caused anxiety and was WNL. Reports that she had to drop one of her classes. Reports that she has had some increased anxiety with health issues and school stress. Denies panic attacks. Mood has been ok, "kind of blah" and denies sad mood. Reports that her concentration has been decreased due to anxiety and frequent headaches. Reports that her sleep has "generally been ok." Appetite has been good. Describes energy and motivation "moderate." Denies SI.   Past medication trials: Vyvanse-effective Dexedrine-effective Methylphenidate-effective for only brief periods of time Concerta-increased blood pressure Strattera-increased blood pressure Mirtazapine-low energy Wellbutrin XL-helpful for mood at 150 mg daily.  Had increased anxiety at 300 mg dose.  Medications: I have reviewed the patient's current medications.  Current Outpatient Medications  Medication Sig Dispense Refill  . buPROPion (WELLBUTRIN XL) 150 MG 24 hr tablet Take 1 tablet (150 mg total) by mouth daily. Take one tablet by mouth each morning. 90 tablet 1  . dextroamphetamine (DEXEDRINE SPANSULE) 10 MG 24 hr capsule TK 1 TO 3 CS PO D  0  . EPINEPHrine 0.3 mg/0.3 mL IJ SOAJ injection Inject 0.3 mLs (0.3 mg total) into the muscle once. 1 Device 0  . fluticasone (FLONASE) 50 MCG/ACT nasal spray Place into both nostrils daily.    . nitrofurantoin, macrocrystal-monohydrate, (MACROBID) 100 MG capsule Take one after intercourse prn or take one BID x 5 days  prn burning with urination 30 capsule 0  . NUVARING 0.12-0.015 MG/24HR vaginal ring Insert nuvaring vaginally, leave in for three weeks, remove for one week for menses.  Repeat 3 each 3  . PROAIR HFA 108 (90 Base) MCG/ACT inhaler INHALE 2 PUFFS INTO THE LUNGS EVERY 4 HOURS AS NEEDED FOR WHEEZING OR SHORTNESS OF BREATH 8.5 g 0  . ranitidine (ZANTAC) 75 MG tablet Take by mouth.    . rizatriptan (MAXALT) 10 MG tablet Take 1 tablet (10 mg total) by mouth as needed for migraine. May repeat in 2 hours if needed 10 tablet 0  . traZODone (DESYREL) 50 MG tablet Take 1-2 tabs po QHS prn insomnia 180 tablet 1  . vortioxetine HBr (TRINTELLIX) 5 MG TABS tablet Take 1 tablet (5 mg total) by mouth daily. 90 tablet 1  . VYVANSE 50 MG capsule   0  . dextroamphetamine (DEXEDRINE) 10 MG 24 hr capsule Take 1-3 capsules po qd prn on days off 90 capsule 0  . diazepam (VALIUM) 5 MG tablet Take by mouth.     No current facility-administered medications for this visit.     Medication Side Effects: None  Allergies:  Allergies  Allergen Reactions  . Banana Anaphylaxis  . Other Anaphylaxis    Chickpeas  . Augmentin [Amoxicillin-Pot Clavulanate] Hives  . Codeine     Other reaction(s): Vomiting  . Hydrocodone Other (See Comments)  . Penicillins Hives  . Sudafed [Pseudoephedrine Hcl]   . Tape     Past Medical History:  Diagnosis Date  . ADD (attention deficit disorder)   .  Allergic rhinitis   . Anxiety   . Asthma   . Depression    DR. KAUR  . Insomnia   . Migraine   . Other abnormal glucose    SEASONAL ALLERGIES  . Panic attacks   . Strabismic amblyopia of right eye     Family History  Problem Relation Age of Onset  . Healthy Mother   . Cancer Mother        BASEL CELL 40  . Healthy Father   . Hyperlipidemia Father   . Cancer Father        BASEL CELL 40  . ADD / ADHD Father   . Healthy Sister   . Depression Sister   . Liver cancer Maternal Grandmother   . Cancer Maternal Grandmother         COLON  . Schizophrenia Maternal Grandmother   . Dementia Maternal Grandfather     Social History   Socioeconomic History  . Marital status: Single    Spouse name: Not on file  . Number of children: 0  . Years of education: 61  . Highest education level: Not on file  Occupational History  . Occupation: NURSE  Social Needs  . Financial resource strain: Not on file  . Food insecurity:    Worry: Not on file    Inability: Not on file  . Transportation needs:    Medical: Not on file    Non-medical: Not on file  Tobacco Use  . Smoking status: Never Smoker  . Smokeless tobacco: Never Used  Substance and Sexual Activity  . Alcohol use: Yes    Alcohol/week: 1.0 - 2.0 standard drinks    Types: 1 - 2 Cans of beer per week    Comment: occasionally  . Drug use: No  . Sexual activity: Yes    Partners: Male    Birth control/protection: Inserts  Lifestyle  . Physical activity:    Days per week: Not on file    Minutes per session: Not on file  . Stress: Not on file  Relationships  . Social connections:    Talks on phone: Not on file    Gets together: Not on file    Attends religious service: Not on file    Active member of club or organization: Not on file    Attends meetings of clubs or organizations: Not on file    Relationship status: Not on file  . Intimate partner violence:    Fear of current or ex partner: Not on file    Emotionally abused: Not on file    Physically abused: Not on file    Forced sexual activity: Not on file  Other Topics Concern  . Not on file  Social History Narrative  . Not on file    Past Medical History, Surgical history, Social history, and Family history were reviewed and updated as appropriate.   Please see review of systems for further details on the patient's review from today.   Review of Systems:  Review of Systems  Eyes: Positive for pain and visual disturbance.       Double vision  Allergic/Immunologic: Positive for environmental  allergies.  Neurological: Positive for headaches.    Objective:   Physical Exam:  BP 137/88   Pulse 94   Physical Exam  Constitutional: She is oriented to person, place, and time. She appears well-developed. No distress.  Musculoskeletal: Normal range of motion.  Neurological: She is alert and oriented to person, place, and time.  Coordination normal.  Psychiatric: Her speech is normal and behavior is normal. Judgment and thought content normal. Her mood appears anxious. Her affect is not angry, not blunt, not labile and not inappropriate. Cognition and memory are normal. She does not exhibit a depressed mood. She expresses no homicidal and no suicidal ideation. She expresses no suicidal plans and no homicidal plans.    Lab Review:     Component Value Date/Time   NA 134 (L) 10/23/2015 0146   K 3.3 (L) 10/23/2015 0146   CL 103 10/23/2015 0146   CO2 23 10/23/2015 0146   GLUCOSE 125 (H) 10/23/2015 0146   BUN 11 10/23/2015 0146   CREATININE 0.90 10/23/2015 0146   CALCIUM 9.4 10/23/2015 0146   GFRNONAA >60 10/23/2015 0146   GFRAA >60 10/23/2015 0146       Component Value Date/Time   WBC 7.1 04/05/2017 1206   WBC 9.2 10/23/2015 0146   RBC 4.87 04/05/2017 1206   RBC 5.42 (H) 10/23/2015 0146   HGB 14.3 04/05/2017 1206   HCT 43.0 04/05/2017 1206   PLT 291 04/05/2017 1206   MCV 88 04/05/2017 1206   MCH 29.4 04/05/2017 1206   MCH 29.6 10/23/2015 0146   MCHC 33.3 04/05/2017 1206   MCHC 34.9 10/23/2015 0146   RDW 12.8 04/05/2017 1206   LYMPHSABS 2.9 04/05/2017 1206   EOSABS 0.3 04/05/2017 1206   BASOSABS 0.0 04/05/2017 1206    No results found for: POCLITH, LITHIUM   No results found for: PHENYTOIN, PHENOBARB, VALPROATE, CBMZ   .res Assessment: Plan:    Attention deficit hyperactivity disorder (ADHD), combined type - Plan: dextroamphetamine (DEXEDRINE) 10 MG 24 hr capsule  Primary insomnia - Plan: traZODone (DESYREL) 50 MG tablet  Mild episode of recurrent major  depressive disorder (HCC) - Plan: buPROPion (WELLBUTRIN XL) 150 MG 24 hr tablet, vortioxetine HBr (TRINTELLIX) 5 MG TABS tablet  Anxiety Will continue Trintellix 5 mg daily for mood. Please see After Visit Summary for patient specific instructions.  Future Appointments  Date Time Provider Department Center  03/07/2018 10:00 AM Corie Chiquito, PMHNP [facility] None    No orders of the defined types were placed in this encounter.     -------------------------------

## 2017-11-28 ENCOUNTER — Other Ambulatory Visit: Payer: Self-pay | Admitting: Family Medicine

## 2017-11-29 NOTE — Telephone Encounter (Signed)
Please review

## 2017-12-02 ENCOUNTER — Ambulatory Visit: Payer: 59 | Admitting: Family Medicine

## 2017-12-02 ENCOUNTER — Encounter: Payer: Self-pay | Admitting: Family Medicine

## 2017-12-02 ENCOUNTER — Other Ambulatory Visit: Payer: Self-pay

## 2017-12-02 VITALS — BP 137/78 | HR 112 | Temp 98.3°F | Resp 97 | Ht 63.0 in | Wt 100.4 lb

## 2017-12-02 DIAGNOSIS — J0110 Acute frontal sinusitis, unspecified: Secondary | ICD-10-CM | POA: Diagnosis not present

## 2017-12-02 DIAGNOSIS — J4521 Mild intermittent asthma with (acute) exacerbation: Secondary | ICD-10-CM | POA: Diagnosis not present

## 2017-12-02 MED ORDER — AZITHROMYCIN 250 MG PO TABS: ORAL_TABLET | ORAL | 0 refills | Status: DC

## 2017-12-02 MED ORDER — ALBUTEROL SULFATE HFA 108 (90 BASE) MCG/ACT IN AERS: INHALATION_SPRAY | RESPIRATORY_TRACT | 0 refills | Status: DC

## 2017-12-02 NOTE — Progress Notes (Signed)
Patient: Debra Garza Female    DOB: 01-Mar-1980   37 y.o.   MRN: [id number] Visit Date: 12/02/2017  Today's Provider: Dortha Kern, PA   Chief Complaint  Patient presents with  . Cough    congestion, fatigue, sinus pressure, sinus pain   Subjective:    HPI  Pt reports she is here for cold like symptoms, laryngitis, productive cough with green mucus, congestion, and sinus congestion with light green mucus.  Symptoms started last Friday 11/25/17. Sore throat one week ago - was out of work for 2 days. Has taken Mucinex, Tussin, Goody Powder and Chlorpheniramine.    Past Medical History:  Diagnosis Date  . ADD (attention deficit disorder)   . Allergic rhinitis   . Anxiety   . Asthma   . Depression    DR. KAUR  . Insomnia   . Migraine   . Other abnormal glucose    SEASONAL ALLERGIES  . Panic attacks   . Strabismic amblyopia of right eye    Patient Active Problem List   Diagnosis Date Noted  . Attention deficit hyperactivity disorder, combined type 11/09/2017  . Anxiety 11/09/2017  . Insomnia 11/09/2017  . Diplopia 10/04/2017  . Adjustment disorder with depressed mood 12/16/2006  . Attention deficit hyperactivity disorder (ADHD), predominantly inattentive type 10/29/2005  . Acute onset aura migraine 10/29/2005   Past Surgical History:  Procedure Laterality Date  . NO PAST SURGERIES     Family History  Problem Relation Age of Onset  . Healthy Mother   . Cancer Mother        BASEL CELL 40  . Healthy Father   . Hyperlipidemia Father   . Cancer Father        BASEL CELL 40  . ADD / ADHD Father   . Healthy Sister   . Depression Sister   . Liver cancer Maternal Grandmother   . Cancer Maternal Grandmother        COLON  . Schizophrenia Maternal Grandmother   . Dementia Maternal Grandfather    Allergies  Allergen Reactions  . Banana Anaphylaxis  . Other Anaphylaxis    Chickpeas  . Augmentin [Amoxicillin-Pot Clavulanate] Hives  . Codeine    Other reaction(s): Vomiting  . Hydrocodone Other (See Comments)  . Penicillins Hives  . Sudafed [Pseudoephedrine Hcl]   . Tape     Current Outpatient Medications:  .  buPROPion (WELLBUTRIN XL) 150 MG 24 hr tablet, Take 1 tablet (150 mg total) by mouth daily. Take one tablet by mouth each morning., Disp: 90 tablet, Rfl: 1 .  dextroamphetamine (DEXEDRINE) 10 MG 24 hr capsule, Take 1-3 capsules po qd prn on days off, Disp: 90 capsule, Rfl: 0 .  EPINEPHrine 0.3 mg/0.3 mL IJ SOAJ injection, Inject 0.3 mLs (0.3 mg total) into the muscle once., Disp: 1 Device, Rfl: 0 .  fluticasone (FLONASE) 50 MCG/ACT nasal spray, Place into both nostrils daily., Disp: , Rfl:  .  nitrofurantoin, macrocrystal-monohydrate, (MACROBID) 100 MG capsule, Take one after intercourse prn or take one BID x 5 days prn burning with urination, Disp: 30 capsule, Rfl: 0 .  NUVARING 0.12-0.015 MG/24HR vaginal ring, Insert nuvaring vaginally, leave in for three weeks, remove for one week for menses.  Repeat, Disp: 3 each, Rfl: 3 .  PROAIR HFA 108 (90 Base) MCG/ACT inhaler, INHALE 2 PUFFS INTO THE LUNGS EVERY 4 HOURS AS NEEDED FOR WHEEZING OR SHORTNESS OF BREATH, Disp: 8.5 g, Rfl: 0 .  ranitidine (  ZANTAC) 75 MG tablet, Take by mouth., Disp: , Rfl:  .  rizatriptan (MAXALT) 10 MG tablet, Take 1 tablet (10 mg total) by mouth as needed for migraine. May repeat in 2 hours if needed, Disp: 10 tablet, Rfl: 0 .  traZODone (DESYREL) 50 MG tablet, Take 1-2 tabs po QHS prn insomnia, Disp: 180 tablet, Rfl: 1 .  vortioxetine HBr (TRINTELLIX) 5 MG TABS tablet, Take 1 tablet (5 mg total) by mouth daily., Disp: 90 tablet, Rfl: 1 .  VYVANSE 50 MG capsule, , Disp: , Rfl: 0 .  diazepam (VALIUM) 5 MG tablet, Take by mouth., Disp: , Rfl:   Review of Systems  Constitutional: Positive for fatigue and fever (low grade friday and sat). Negative for activity change, appetite change, chills, diaphoresis and unexpected weight change.  HENT: Positive for  congestion, postnasal drip, sinus pressure, sinus pain and voice change. Negative for dental problem, drooling, ear discharge, ear pain, facial swelling, hearing loss, mouth sores, nosebleeds, rhinorrhea, sneezing, sore throat, tinnitus and trouble swallowing.   Eyes: Negative.   Respiratory: Positive for cough. Negative for apnea, choking, chest tightness, shortness of breath, wheezing and stridor.   Cardiovascular: Negative.   Gastrointestinal: Negative.   Endocrine: Negative.   Genitourinary: Negative.   Musculoskeletal: Negative.   Skin: Negative.   Allergic/Immunologic: Negative.   Neurological: Negative.   Hematological: Negative.   Psychiatric/Behavioral: Negative.    Social History   Tobacco Use  . Smoking status: Never Smoker  . Smokeless tobacco: Never Used  Substance Use Topics  . Alcohol use: Yes    Alcohol/week: 1.0 - 2.0 standard drinks    Types: 1 - 2 Cans of beer per week    Comment: occasionally   Objective:   BP 137/78 (BP Location: Right Arm, Patient Position: Sitting, Cuff Size: Normal)   Pulse (!) 112   Temp 98.3 F (36.8 C) (Oral)   Resp (!) 97   Ht 5\' 3"  (1.6 m)   Wt 100 lb 6.4 oz (45.5 kg)   BMI 17.79 kg/m  Vitals:   12/02/17 1524  BP: 137/78  Pulse: (!) 112  Resp: (!) 97  Temp: 98.3 F (36.8 C)  TempSrc: Oral  Weight: 100 lb 6.4 oz (45.5 kg)  Height: 5\' 3"  (1.6 m)   Physical Exam  Constitutional: She is oriented to person, place, and time. She appears well-developed and well-nourished. No distress.  HENT:  Head: Normocephalic and atraumatic.  Right Ear: Hearing and external ear normal.  Left Ear: Hearing and external ear normal.  Nose: Nose normal.  Mouth/Throat: Oropharynx is clear and moist.  Slightly injected soft palate near tonsillar pillars. No exudates. Tender frontal sinuses. Good transillumination throughout.  Eyes: Conjunctivae, EOM and lids are normal. Right eye exhibits no discharge. Left eye exhibits no discharge. No scleral  icterus.  Neck: Neck supple.  Cardiovascular: Normal rate and regular rhythm.  Pulmonary/Chest: Effort normal and breath sounds normal. No respiratory distress. She has no rales.  Coarse breath sounds.  Musculoskeletal: Normal range of motion.  Lymphadenopathy:    She has no cervical adenopathy.  Neurological: She is alert and oriented to person, place, and time.  Skin: Skin is intact. No lesion and no rash noted.  Psychiatric: She has a normal mood and affect. Her speech is normal and behavior is normal. Thought content normal.      Assessment & Plan:     1. Asthmatic bronchitis, mild intermittent, with acute exacerbation Developed cough with purulent sputum, wheezing, low grade  fever, scratchy throat and head congestion over the past 7 days. Out of work when she had fever and tried several OTC cough and congestion medications with slight relief. May continue Mucinex-DM with Flonase and antihistamine. Will add Albuterol inhaler and Z-pak. Increase fluid intake. May return to work in 2-3 days if no further fever for 24 hours without taking any antipyretics. Recheck prn. - azithromycin (ZITHROMAX) 250 MG tablet; Take 2 tablets by mouth today then one daily for 4 days.  Dispense: 6 tablet; Refill: 0 - albuterol (PROAIR HFA) 108 (90 Base) MCG/ACT inhaler; INHALE 2 PUFFS INTO THE LUNGS EVERY 4 HOURS AS NEEDED FOR WHEEZING OR SHORTNESS OF BREATH  Dispense: 8.5 g; Refill: 0  2. Subacute frontal sinusitis Tender frontal sinuses at the midline to percuss. Restart Flonase and cough medications as listed above with the Z-pak. Recheck prn. - azithromycin (ZITHROMAX) 250 MG tablet; Take 2 tablets by mouth today then one daily for 4 days.  Dispense: 6 tablet; Refill: 0       Dortha Kern, PA  [facility]

## 2017-12-27 DIAGNOSIS — H5000 Unspecified esotropia: Secondary | ICD-10-CM | POA: Diagnosis not present

## 2017-12-27 DIAGNOSIS — H532 Diplopia: Secondary | ICD-10-CM | POA: Diagnosis not present

## 2018-01-04 DIAGNOSIS — G43101 Migraine with aura, not intractable, with status migrainosus: Secondary | ICD-10-CM | POA: Diagnosis not present

## 2018-01-04 DIAGNOSIS — H5000 Unspecified esotropia: Secondary | ICD-10-CM | POA: Diagnosis not present

## 2018-01-04 DIAGNOSIS — H532 Diplopia: Secondary | ICD-10-CM | POA: Diagnosis not present

## 2018-01-04 HISTORY — PX: STRABISMUS SURGERY: SHX218

## 2018-01-12 DIAGNOSIS — Z09 Encounter for follow-up examination after completed treatment for conditions other than malignant neoplasm: Secondary | ICD-10-CM | POA: Insufficient documentation

## 2018-02-01 IMAGING — CR DG CHEST 2V
1 series · 2 of 2 positions shown · non-contrast
Comparison: 03/27/2003.

CLINICAL DATA: Cough and congestion.

EXAM:
CHEST  2 VIEW

[Series 1: dg chest 2 view · 0.14mm/px · 2 of 2 slices shown]
[im 1/2]
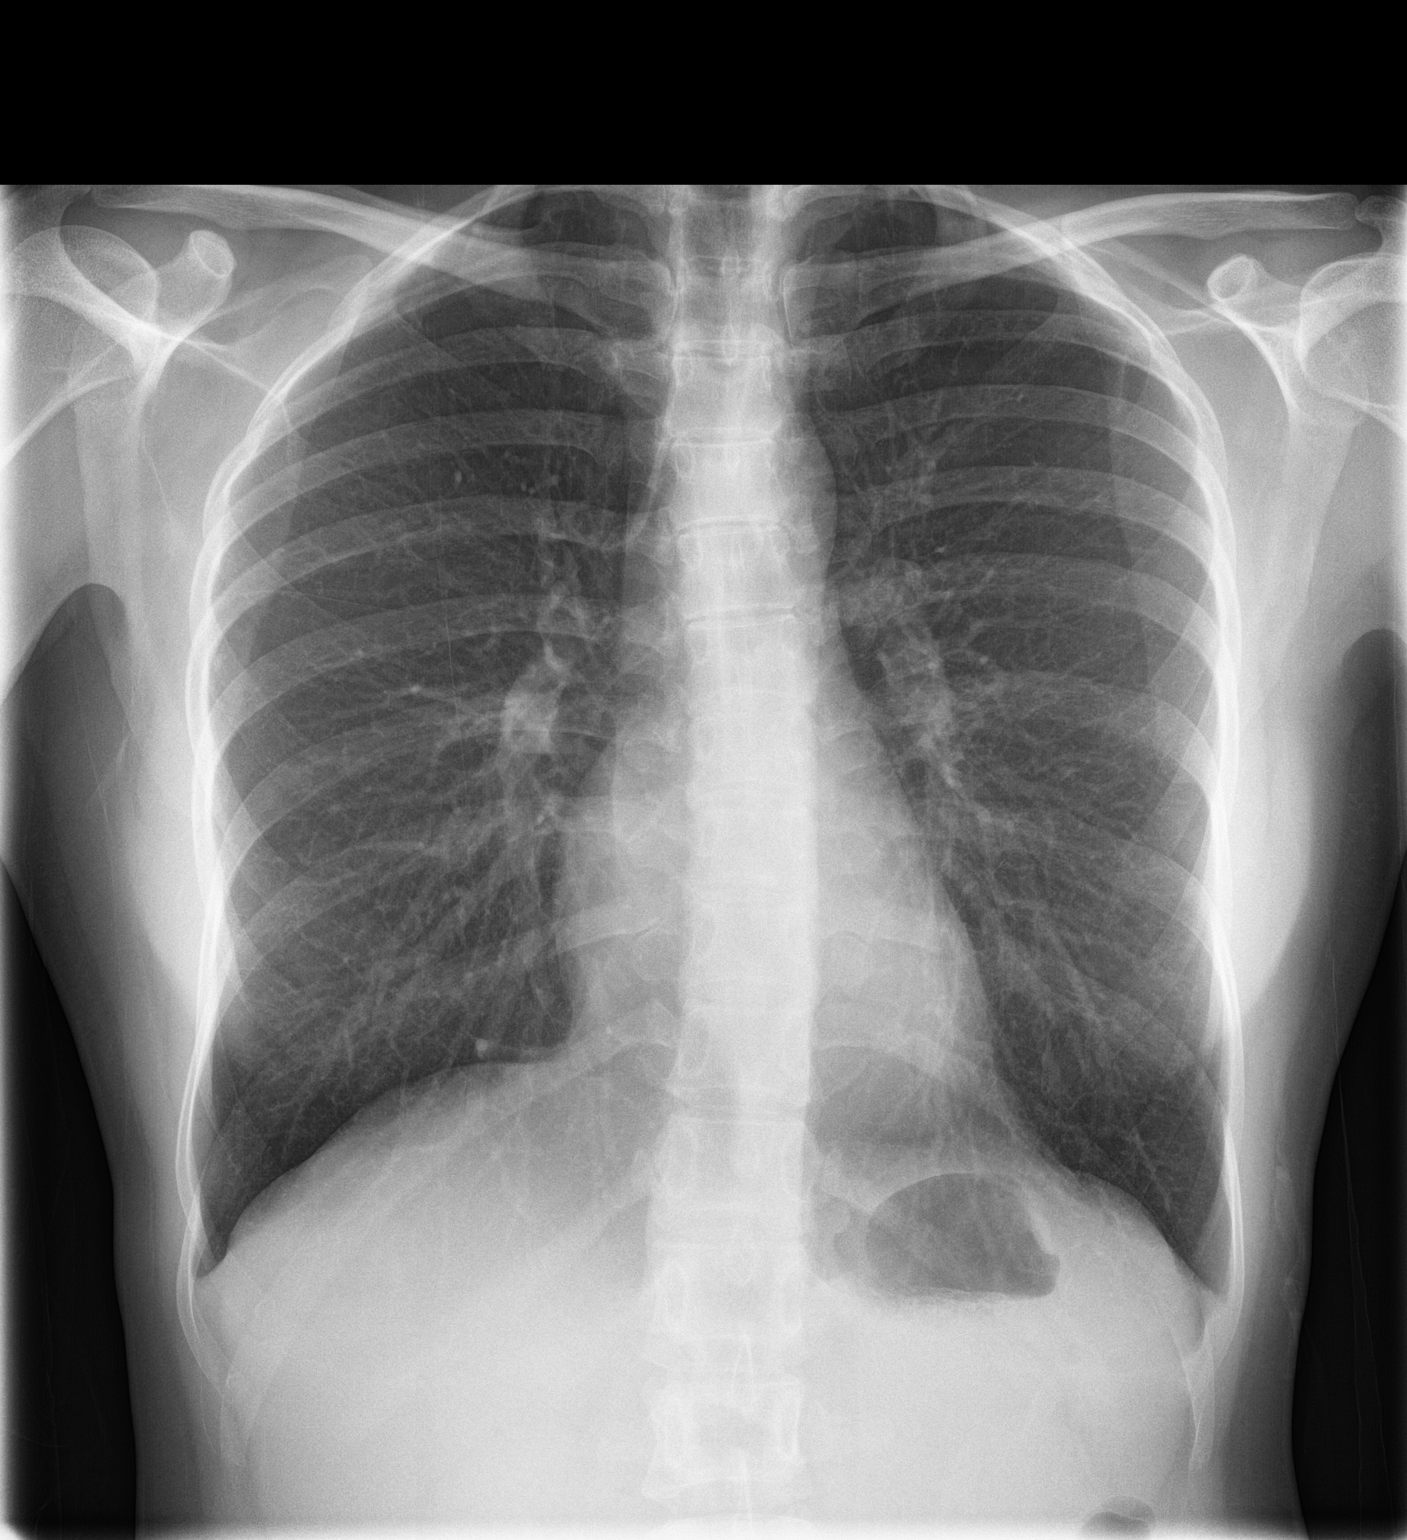
[im 2/2]
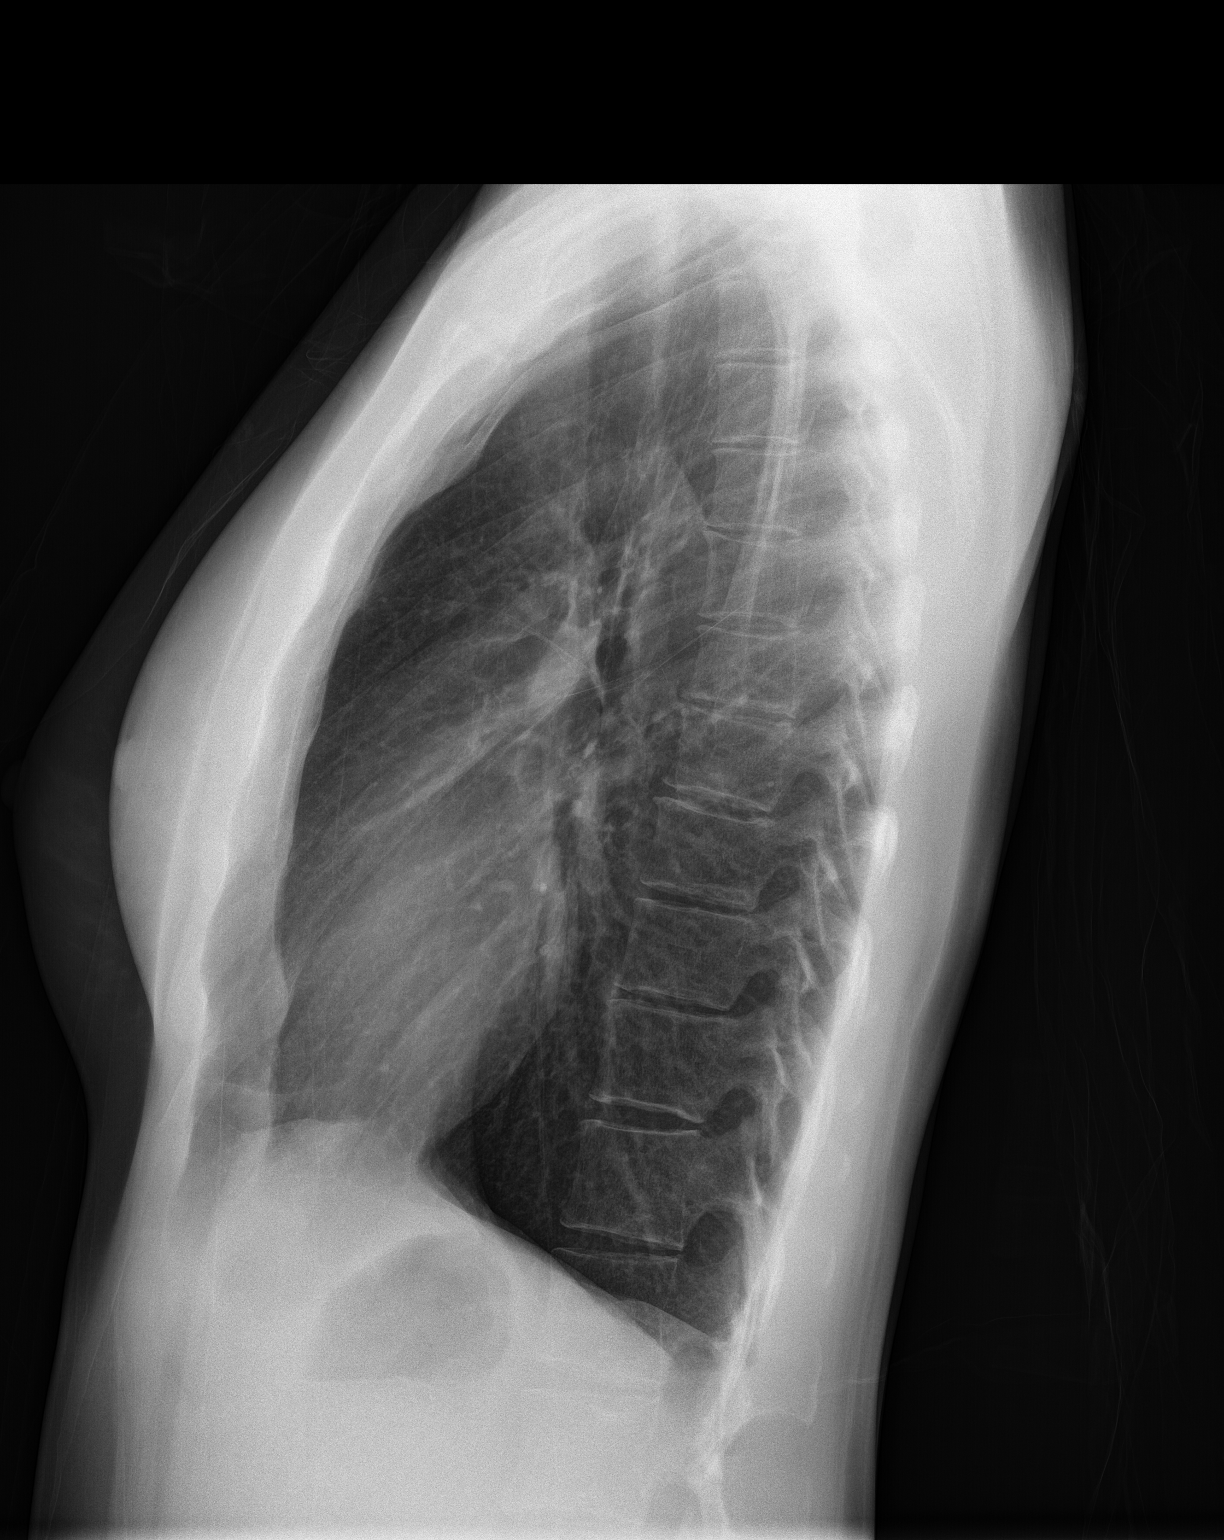

[2 of 2 positions shown; findings below may reference images not displayed]

FINDINGS: Mediastinum hilar structures normal. Lungs are clear. No pleural
effusion or pneumothorax. Heart size normal. No acute bony
abnormality. Mild thoracic spine scoliosis.
IMPRESSION: No acute cardiopulmonary disease.

## 2018-02-21 ENCOUNTER — Telehealth: Payer: Self-pay | Admitting: Psychiatry

## 2018-02-21 ENCOUNTER — Other Ambulatory Visit: Payer: Self-pay | Admitting: Psychiatry

## 2018-02-21 DIAGNOSIS — F902 Attention-deficit hyperactivity disorder, combined type: Secondary | ICD-10-CM

## 2018-02-21 MED ORDER — VYVANSE 50 MG PO CAPS: 50.0000 mg | ORAL_CAPSULE | Freq: Every day | ORAL | 0 refills | Status: DC

## 2018-02-21 NOTE — Telephone Encounter (Signed)
Patient needs a refill on her vyvanse 50 mg sent to [facility] regional [facility]

## 2018-02-25 ENCOUNTER — Encounter: Payer: Self-pay | Admitting: Family Medicine

## 2018-02-27 NOTE — Progress Notes (Signed)
Patient: Debra Garza Female    DOB: 11-Apr-1980   37 y.o.   MRN: [id number] Visit Date: 02/28/2018  Today's Provider: Dortha Kern, PA   Chief Complaint  Patient presents with  . Otalgia   Subjective:     Otalgia   There is pain in the left ear. This is a new problem. The current episode started in the past 7 days (3 days). The problem occurs constantly. There has been no fever. The pain is moderate. Associated symptoms include coughing, headaches, hearing loss, rhinorrhea and a sore throat. Pertinent negatives include no abdominal pain, diarrhea, ear discharge, neck pain, rash or vomiting. Treatments tried: Chlortabs. The treatment provided mild relief.   Patient has had left ear pain for 3 days. Patient states she has had a productive cough for over 1 week. Patient also has symptoms of headache, sore throat, runny nose, and congestion. Patient has been taking Chlor tabs with mild relief.   Past Medical History:  Diagnosis Date  . ADD (attention deficit disorder)   . Allergic rhinitis   . Anxiety   . Asthma   . Depression    DR. KAUR  . Insomnia   . Migraine   . Other abnormal glucose    SEASONAL ALLERGIES  . Panic attacks   . Strabismic amblyopia of right eye    Past Surgical History:  Procedure Laterality Date  . NO PAST SURGERIES     Family History  Problem Relation Age of Onset  . Healthy Mother   . Cancer Mother        BASEL CELL 40  . Healthy Father   . Hyperlipidemia Father   . Cancer Father        BASEL CELL 40  . ADD / ADHD Father   . Healthy Sister   . Depression Sister   . Liver cancer Maternal Grandmother   . Cancer Maternal Grandmother        COLON  . Schizophrenia Maternal Grandmother   . Dementia Maternal Grandfather    Allergies  Allergen Reactions  . Banana Anaphylaxis  . Other Anaphylaxis    Chickpeas  . Augmentin [Amoxicillin-Pot Clavulanate] Hives  . Codeine     Other reaction(s): Vomiting  . Hydrocodone Other (See  Comments)  . Penicillins Hives  . Sudafed [Pseudoephedrine Hcl]   . Tape     Current Outpatient Medications:  .  albuterol (PROAIR HFA) 108 (90 Base) MCG/ACT inhaler, INHALE 2 PUFFS INTO THE LUNGS EVERY 4 HOURS AS NEEDED FOR WHEEZING OR SHORTNESS OF BREATH, Disp: 8.5 g, Rfl: 0 .  buPROPion (WELLBUTRIN XL) 150 MG 24 hr tablet, Take 1 tablet (150 mg total) by mouth daily. Take one tablet by mouth each morning., Disp: 90 tablet, Rfl: 1 .  dextroamphetamine (DEXEDRINE) 10 MG 24 hr capsule, Take 1-3 capsules po qd prn on days off, Disp: 90 capsule, Rfl: 0 .  EPINEPHrine 0.3 mg/0.3 mL IJ SOAJ injection, Inject 0.3 mLs (0.3 mg total) into the muscle once., Disp: 1 Device, Rfl: 0 .  fluticasone (FLONASE) 50 MCG/ACT nasal spray, Place into both nostrils daily., Disp: , Rfl:  .  nitrofurantoin, macrocrystal-monohydrate, (MACROBID) 100 MG capsule, Take one after intercourse prn or take one BID x 5 days prn burning with urination, Disp: 30 capsule, Rfl: 0 .  NUVARING 0.12-0.015 MG/24HR vaginal ring, Insert nuvaring vaginally, leave in for three weeks, remove for one week for menses.  Repeat, Disp: 3 each, Rfl: 3 .  ranitidine (ZANTAC) 75 MG tablet, Take by mouth., Disp: , Rfl:  .  rizatriptan (MAXALT) 10 MG tablet, Take 1 tablet (10 mg total) by mouth as needed for migraine. May repeat in 2 hours if needed, Disp: 10 tablet, Rfl: 0 .  traZODone (DESYREL) 50 MG tablet, Take 1-2 tabs po QHS prn insomnia, Disp: 180 tablet, Rfl: 1 .  VYVANSE 50 MG capsule, Take 1 capsule (50 mg total) by mouth daily for 30 days., Disp: 30 capsule, Rfl: 0 .  azithromycin (ZITHROMAX) 250 MG tablet, Take 2 tablets by mouth today then one daily for 4 days. (Patient not taking: Reported on 02/28/2018), Disp: 6 tablet, Rfl: 0 .  diazepam (VALIUM) 5 MG tablet, Take by mouth., Disp: , Rfl:   Review of Systems  Constitutional: Negative for appetite change, chills, fatigue and fever.  HENT: Positive for congestion, ear pain, hearing loss,  rhinorrhea and sore throat. Negative for ear discharge.   Respiratory: Positive for cough. Negative for chest tightness, shortness of breath and wheezing.   Cardiovascular: Negative for chest pain and palpitations.  Gastrointestinal: Negative for abdominal pain, diarrhea, nausea and vomiting.  Musculoskeletal: Negative for neck pain.  Skin: Negative for rash.  Neurological: Positive for headaches. Negative for dizziness and weakness.   Social History   Tobacco Use  . Smoking status: Never Smoker  . Smokeless tobacco: Never Used  Substance Use Topics  . Alcohol use: Yes    Alcohol/week: 1.0 - 2.0 standard drinks    Types: 1 - 2 Cans of beer per week    Comment: occasionally     Objective:   BP 120/84 (BP Location: Right Arm, Patient Position: Sitting, Cuff Size: Normal)   Pulse 98   Temp 98.2 F (36.8 C) (Oral)   Resp 16   Wt 103 lb (46.7 kg)   SpO2 98%   BMI 18.25 kg/m  Vitals:   02/28/18 0859  BP: 120/84  Pulse: 98  Resp: 16  Temp: 98.2 F (36.8 C)  TempSrc: Oral  SpO2: 98%  Weight: 103 lb (46.7 kg)   Physical Exam Constitutional:      Appearance: Normal appearance.  HENT:     Head: Normocephalic.     Right Ear: Tympanic membrane normal.     Ears:     Comments: Red left TM with bulging.    Nose: Nose normal.     Mouth/Throat:     Pharynx: Oropharynx is clear.  Eyes:     Extraocular Movements: Extraocular movements intact.  Cardiovascular:     Rate and Rhythm: Normal rate and regular rhythm.     Heart sounds: Normal heart sounds.  Pulmonary:     Effort: Pulmonary effort is normal.     Breath sounds: Normal breath sounds.  Abdominal:     General: Bowel sounds are normal.     Palpations: Abdomen is soft.  Neurological:     Mental Status: She is alert.       Assessment & Plan    1. Left otitis media, unspecified otitis media type Pain and hearing diminished over the past 3-4 days after having congestion and cough. TM is very red and bulging today.  No drainage from the left ear. Slight PND with cobblestoning of posterior pharynx. No fever or sore throat. May use Flonase with Chlortabs and add Z-pak. Recheck in 7-10 days if no better. - azithromycin (ZITHROMAX) 250 MG tablet; Take 2 tablets by mouth the first day then one daily for 4 days.  Dispense:  6 tablet; Refill: 0  2. Adjustment disorder with depressed mood Psychiatrist recently added Trintellix to there Bupropion and Trazodone regimen. Feels it is working well. No suicidal ideation. Planning to get married 04-01-18. Continue follow up with psychiatrist as planned. - vortioxetine HBr (TRINTELLIX) 5 MG TABS tablet; Take 1 tablet (5 mg total) by mouth daily.  Dispense: 1 tablet; Refill: 0     Dortha Kernennis Chrismon, PA  [facility]

## 2018-02-28 ENCOUNTER — Encounter: Payer: Self-pay | Admitting: Family Medicine

## 2018-02-28 ENCOUNTER — Ambulatory Visit (INDEPENDENT_AMBULATORY_CARE_PROVIDER_SITE_OTHER): Payer: 59 | Admitting: Family Medicine

## 2018-02-28 VITALS — BP 120/84 | HR 98 | Temp 98.2°F | Resp 16 | Wt 103.0 lb

## 2018-02-28 DIAGNOSIS — H6692 Otitis media, unspecified, left ear: Secondary | ICD-10-CM | POA: Diagnosis not present

## 2018-02-28 DIAGNOSIS — F4321 Adjustment disorder with depressed mood: Secondary | ICD-10-CM | POA: Diagnosis not present

## 2018-02-28 MED ORDER — AZITHROMYCIN 250 MG PO TABS: ORAL_TABLET | ORAL | 0 refills | Status: DC

## 2018-02-28 MED ORDER — VORTIOXETINE HBR 5 MG PO TABS: 5.0000 mg | ORAL_TABLET | Freq: Every day | ORAL | 0 refills | Status: DC

## 2018-03-01 ENCOUNTER — Other Ambulatory Visit: Payer: Self-pay | Admitting: Family Medicine

## 2018-03-01 ENCOUNTER — Encounter: Payer: Self-pay | Admitting: Family Medicine

## 2018-03-01 DIAGNOSIS — R059 Cough, unspecified: Secondary | ICD-10-CM

## 2018-03-01 DIAGNOSIS — R05 Cough: Secondary | ICD-10-CM

## 2018-03-01 MED ORDER — DM-GUAIFENESIN ER 30-600 MG PO TB12: 1.0000 | ORAL_TABLET | Freq: Two times a day (BID) | ORAL | 0 refills | Status: DC

## 2018-03-07 ENCOUNTER — Ambulatory Visit: Payer: 59 | Admitting: Psychiatry

## 2018-03-07 ENCOUNTER — Encounter: Payer: Self-pay | Admitting: Psychiatry

## 2018-03-07 VITALS — BP 134/91 | HR 81

## 2018-03-07 DIAGNOSIS — F330 Major depressive disorder, recurrent, mild: Secondary | ICD-10-CM | POA: Diagnosis not present

## 2018-03-07 DIAGNOSIS — F902 Attention-deficit hyperactivity disorder, combined type: Secondary | ICD-10-CM

## 2018-03-07 DIAGNOSIS — F5101 Primary insomnia: Secondary | ICD-10-CM

## 2018-03-07 DIAGNOSIS — F419 Anxiety disorder, unspecified: Secondary | ICD-10-CM | POA: Diagnosis not present

## 2018-03-07 MED ORDER — DEXTROAMPHETAMINE SULFATE ER 10 MG PO CP24: ORAL_CAPSULE | ORAL | 0 refills | Status: DC

## 2018-03-07 MED ORDER — LISDEXAMFETAMINE DIMESYLATE 50 MG PO CAPS: 50.0000 mg | ORAL_CAPSULE | Freq: Every day | ORAL | 0 refills | Status: DC

## 2018-03-07 MED ORDER — BUPROPION HCL ER (XL) 150 MG PO TB24: 150.0000 mg | ORAL_TABLET | Freq: Every day | ORAL | 1 refills | Status: DC

## 2018-03-07 MED ORDER — TRAZODONE HCL 50 MG PO TABS: ORAL_TABLET | ORAL | 1 refills | Status: DC

## 2018-03-07 MED ORDER — VYVANSE 50 MG PO CAPS: 50.0000 mg | ORAL_CAPSULE | Freq: Every day | ORAL | 0 refills | Status: DC

## 2018-03-07 MED ORDER — VORTIOXETINE HBR 5 MG PO TABS: 5.0000 mg | ORAL_TABLET | Freq: Every day | ORAL | 0 refills | Status: DC

## 2018-03-07 NOTE — Progress Notes (Signed)
Debra Garza [id number] 1980-02-07 38 y.o.  Subjective:   Patient ID:  Debra Garza is a 38 y.o. (DOB 1980-03-03) female.  Chief Complaint:  Chief Complaint  Patient presents with  . Follow-up    ADD and h/o insomnia, anxiety, and depression    HPI [name] presents to the office today for follow-up of anxiety, depression, insomnia, and ADD. Reports that her wedding is less than a month away and has some stress r/t getting everything prepared. She reports that her anxiety has been manageable. She reports that stress occasionally results in "me losing my temper."  Denies depressed mood other than when she received negative feedback at work before having surgery. Reports that her work has improved recently since her surgery. She reports that her concentration has been "better" and questions if this is due to not taking Vyvanse around the time of her surgery. Has been able to get to work early and is able to get organized. She reports that her sleep has been adequate overall. She reports some episodic insomnia after working a night shift and having a stressful change of shift. Reports that her appetite has been stable. Denies SI.   Reports that she had to drop her classes since she had migraines whenever she tried to study. She reports that her headaches have resolved since her surgery. Fiance lost his job in December and they were unable to purchase house that they had a contract on.   Past medication trials: Vyvanse-effective Dexedrine-effective Methylphenidate-effective for only brief periods of time Concerta-increased blood pressure Strattera-increased blood pressure Mirtazapine-low energy Wellbutrin XL-helpful for mood at 150 mg daily.  Had increased anxiety at 300 mg dose.  Review of Systems:  Review of Systems  HENT: Positive for ear pain and sinus pain.   Eyes:       Reports that recent eye surgery went well.   Musculoskeletal: Negative for gait  problem.  Neurological: Negative for tremors and headaches.  Psychiatric/Behavioral:       Please refer to HPI    Medications: I have reviewed the patient's current medications.  Current Outpatient Medications  Medication Sig Dispense Refill  . albuterol (PROAIR HFA) 108 (90 Base) MCG/ACT inhaler INHALE 2 PUFFS INTO THE LUNGS EVERY 4 HOURS AS NEEDED FOR WHEEZING OR SHORTNESS OF BREATH 8.5 g 0  . dextromethorphan-guaiFENesin (MUCINEX DM) 30-600 MG 12hr tablet Take 1 tablet by mouth 2 (two) times daily. 20 tablet 0  . fluticasone (FLONASE) 50 MCG/ACT nasal spray Place into both nostrils daily.    Marland Kitchen NUVARING 0.12-0.015 MG/24HR vaginal ring Insert nuvaring vaginally, leave in for three weeks, remove for one week for menses.  Repeat 3 each 3  . ranitidine (ZANTAC) 75 MG tablet Take by mouth daily as needed.     . traZODone (DESYREL) 50 MG tablet Take 1-2 tabs po QHS prn insomnia 180 tablet 1  . vortioxetine HBr (TRINTELLIX) 5 MG TABS tablet Take 1 tablet (5 mg total) by mouth daily. 30 tablet 0  . [START ON 03/21/2018] VYVANSE 50 MG capsule Take 1 capsule (50 mg total) by mouth daily for 30 days. 30 capsule 0  . buPROPion (WELLBUTRIN XL) 150 MG 24 hr tablet Take 1 tablet (150 mg total) by mouth daily. Take one tablet by mouth each morning. 90 tablet 1  . dextroamphetamine (DEXEDRINE) 10 MG 24 hr capsule Take 1-3 capsules po qd prn on days off 90 capsule 0  . diazepam (VALIUM) 5 MG tablet Take by mouth.    Marland Kitchen  EPINEPHrine 0.3 mg/0.3 mL IJ SOAJ injection Inject 0.3 mLs (0.3 mg total) into the muscle once. 1 Device 0  . [START ON 04/18/2018] lisdexamfetamine (VYVANSE) 50 MG capsule Take 1 capsule (50 mg total) by mouth daily. 30 capsule 0  . [START ON 05/16/2018] lisdexamfetamine (VYVANSE) 50 MG capsule Take 1 capsule (50 mg total) by mouth daily. 30 capsule 0  . rizatriptan (MAXALT) 10 MG tablet Take 1 tablet (10 mg total) by mouth as needed for migraine. May repeat in 2 hours if needed 10 tablet 0  .  vortioxetine HBr (TRINTELLIX) 5 MG TABS tablet Take 1 tablet (5 mg total) by mouth daily. 90 tablet 1   No current facility-administered medications for this visit.     Medication Side Effects: None  Allergies:  Allergies  Allergen Reactions  . Banana Anaphylaxis  . Other Anaphylaxis    Chickpeas  . Augmentin [Amoxicillin-Pot Clavulanate] Hives  . Codeine     Other reaction(s): Vomiting  . Hydrocodone Other (See Comments)  . Penicillins Hives  . Sudafed [Pseudoephedrine Hcl]   . Tape     Past Medical History:  Diagnosis Date  . ADD (attention deficit disorder)   . Allergic rhinitis   . Anxiety   . Asthma   . Depression    DR. KAUR  . Insomnia   . Migraine   . Other abnormal glucose    SEASONAL ALLERGIES  . Panic attacks   . Strabismic amblyopia of right eye     Family History  Problem Relation Age of Onset  . Healthy Mother   . Cancer Mother        BASEL CELL 40  . Healthy Father   . Hyperlipidemia Father   . Cancer Father        BASEL CELL 40  . ADD / ADHD Father   . Healthy Sister   . Depression Sister   . Liver cancer Maternal Grandmother   . Cancer Maternal Grandmother        COLON  . Schizophrenia Maternal Grandmother   . Dementia Maternal Grandfather     Social History   Socioeconomic History  . Marital status: Significant Other    Spouse name: Not on file  . Number of children: 0  . Years of education: 3516  . Highest education level: Not on file  Occupational History  . Occupation: NURSE  Social Needs  . Financial resource strain: Not on file  . Food insecurity:    Worry: Not on file    Inability: Not on file  . Transportation needs:    Medical: Not on file    Non-medical: Not on file  Tobacco Use  . Smoking status: Never Smoker  . Smokeless tobacco: Never Used  Substance and Sexual Activity  . Alcohol use: Yes    Alcohol/week: 1.0 - 2.0 standard drinks    Types: 1 - 2 Cans of beer per week    Comment: occasionally  . Drug use:  No  . Sexual activity: Yes    Partners: Male    Birth control/protection: Inserts  Lifestyle  . Physical activity:    Days per week: Not on file    Minutes per session: Not on file  . Stress: Not on file  Relationships  . Social connections:    Talks on phone: Not on file    Gets together: Not on file    Attends religious service: Not on file    Active member of club or organization: Not  on file    Attends meetings of clubs or organizations: Not on file    Relationship status: Not on file  . Intimate partner violence:    Fear of current or ex partner: Not on file    Emotionally abused: Not on file    Physically abused: Not on file    Forced sexual activity: Not on file  Other Topics Concern  . Not on file  Social History Narrative  . Not on file    Past Medical History, Surgical history, Social history, and Family history were reviewed and updated as appropriate.   Please see review of systems for further details on the patient's review from today.   Objective:   Physical Exam:  BP (!) 134/91   Pulse 81   Physical Exam Constitutional:      General: She is not in acute distress.    Appearance: She is well-developed.  Musculoskeletal:        General: No deformity.  Neurological:     Mental Status: She is alert and oriented to person, place, and time.     Coordination: Coordination normal.  Psychiatric:        Attention and Perception: Attention and perception normal. She does not perceive auditory or visual hallucinations.        Mood and Affect: Mood is anxious. Mood is not depressed. Affect is not labile, blunt, angry or inappropriate.        Speech: Speech normal.        Behavior: Behavior normal.        Thought Content: Thought content normal. Thought content does not include homicidal or suicidal ideation. Thought content does not include homicidal or suicidal plan.        Cognition and Memory: Cognition and memory normal.        Judgment: Judgment normal.      Comments: Insight intact. No delusions.      Lab Review:     Component Value Date/Time   NA 134 (L) 10/23/2015 0146   K 3.3 (L) 10/23/2015 0146   CL 103 10/23/2015 0146   CO2 23 10/23/2015 0146   GLUCOSE 125 (H) 10/23/2015 0146   BUN 11 10/23/2015 0146   CREATININE 0.90 10/23/2015 0146   CALCIUM 9.4 10/23/2015 0146   GFRNONAA >60 10/23/2015 0146   GFRAA >60 10/23/2015 0146       Component Value Date/Time   WBC 7.1 04/05/2017 1206   WBC 9.2 10/23/2015 0146   RBC 4.87 04/05/2017 1206   RBC 5.42 (H) 10/23/2015 0146   HGB 14.3 04/05/2017 1206   HCT 43.0 04/05/2017 1206   PLT 291 04/05/2017 1206   MCV 88 04/05/2017 1206   MCH 29.4 04/05/2017 1206   MCH 29.6 10/23/2015 0146   MCHC 33.3 04/05/2017 1206   MCHC 34.9 10/23/2015 0146   RDW 12.8 04/05/2017 1206   LYMPHSABS 2.9 04/05/2017 1206   EOSABS 0.3 04/05/2017 1206   BASOSABS 0.0 04/05/2017 1206    No results found for: POCLITH, LITHIUM   No results found for: PHENYTOIN, PHENOBARB, VALPROATE, CBMZ   .res Assessment: Plan:   Continue Vyvanse 50 mg po q am for ADHD. Continue Dexedrine 10 mg 1-3 capsules po qd prn on days off. Continue Wellbutrin XL 150 mg po qd for depression  Continue Trintellix 5 mg po qd for depression and anxiety since this has been effective for target s/s.   Attention deficit hyperactivity disorder (ADHD), combined type - Plan: VYVANSE 50 MG capsule, lisdexamfetamine (  VYVANSE) 50 MG capsule, lisdexamfetamine (VYVANSE) 50 MG capsule, dextroamphetamine (DEXEDRINE) 10 MG 24 hr capsule  Mild episode of recurrent major depressive disorder (HCC) - Plan: buPROPion (WELLBUTRIN XL) 150 MG 24 hr tablet, vortioxetine HBr (TRINTELLIX) 5 MG TABS tablet  Anxiety  Primary insomnia - Plan: traZODone (DESYREL) 50 MG tablet  Please see After Visit Summary for patient specific instructions.  Future Appointments  Date Time Provider Department Center  06/06/2018  9:30 AM Corie Chiquitoarter, Reinhard Schack, PMHNP [facility] None     No orders of the defined types were placed in this encounter.     -------------------------------

## 2018-04-03 ENCOUNTER — Other Ambulatory Visit: Payer: Self-pay | Admitting: Certified Nurse Midwife

## 2018-04-03 MED ORDER — NITROFURANTOIN MONOHYD MACRO 100 MG PO CAPS: ORAL_CAPSULE | ORAL | 3 refills | Status: DC

## 2018-05-08 ENCOUNTER — Encounter: Payer: Self-pay | Admitting: Certified Nurse Midwife

## 2018-05-08 ENCOUNTER — Other Ambulatory Visit: Payer: Self-pay

## 2018-05-08 ENCOUNTER — Ambulatory Visit: Payer: Self-pay | Admitting: Certified Nurse Midwife

## 2018-05-08 ENCOUNTER — Ambulatory Visit (INDEPENDENT_AMBULATORY_CARE_PROVIDER_SITE_OTHER): Payer: 59 | Admitting: Certified Nurse Midwife

## 2018-05-08 DIAGNOSIS — Z3169 Encounter for other general counseling and advice on procreation: Secondary | ICD-10-CM

## 2018-05-08 NOTE — Progress Notes (Signed)
Patient stated address & DOB for proper identity. Verified meds/allergies. Pt interested in preconceptual counseling. Virtual Visit via Telephone Note  I connected with Debra Garza on 05/09/18 at  3:30 PM EDT by telephone and verified that I am speaking with the correct person using two identifiers.   I discussed the limitations, risks, security and privacy concerns of performing an evaluation and management service by telephone and the availability of in person appointments. I also discussed with the patient that there may be a patient responsible charge related to this service. The patient expressed understanding and agreed to proceed.  The patient was at home I spoke with the patient from my  workstation The names of people involved in this encounter were: Debra , and [place] , and [place] .   History of Present Illness: Debra L. Garza is a 38 year old Caucasian/White female , G 1 P 0 0 1 0 , had an appointment for preconception counseling. She has recently married. She has not had any gynecological problems. She is currently using the Nuvaring for contraception. 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  Her menses are regular. They occur every 28 days , they last 5 days , are medium flow, and are without clots.   She reports dysmenorrhea with some menses. She uses [employer] with symptomatic relief.  The patient's past medical history is notable for a history of ADD, depression, asthma,anxiety, migraines.  She recently had strabismus surgery 01/05/2019 which has corrected her diplopia, but she is still needing corrective lenses.   Past Medical History:  Diagnosis Date  . ADD (attention deficit disorder)   . Allergic rhinitis   . Anxiety   . Asthma   . Depression    DR. KAUR  . Insomnia   . Migraine   . Other abnormal glucose    SEASONAL ALLERGIES  . Panic attacks   . Strabismic amblyopia of right eye       Past Surgical History:  Procedure Laterality Date  . STRABISMUS SURGERY Bilateral 01/04/2018    Social History   Socioeconomic History  . Marital status: Married    Spouse name: [name]  . Number of children: 0  . Years of education: 39  . Highest education level: Not on file  Occupational History  . Occupation: NURSE  Social Needs  . Financial resource strain: Not on file  . Food insecurity:    Worry: Not on file    Inability: Not on file  . Transportation needs:    Medical: Not on file    Non-medical: Not on file  Tobacco Use  . Smoking status: Never Smoker  . Smokeless tobacco: Never Used  Substance and Sexual Activity  . Alcohol use: Yes    Alcohol/week: 1.0 - 2.0 standard drinks    Types: 1 - 2 Cans of beer per week    Comment: occasionally  . Drug use: No  . Sexual activity: Yes    Partners: Male    Birth control/protection: Other-see comments    Comment: Nuvaring  Lifestyle  . Physical activity:    Days per week: Not on file    Minutes per session: Not on file  . Stress: Not on file  Relationships  . Social connections:    Talks on phone: Not on file    Gets together: Not on file    Attends religious service: Not on file    Active member of club  or organization: Not on file    Attends meetings of clubs or organizations: Not on file    Relationship status: Not on file  . Intimate partner violence:    Fear of current or ex partner: Not on file    Emotionally abused: Not on file    Physically abused: Not on file    Forced sexual activity: Not on file  Other Topics Concern  . Not on file  Social History Narrative  . Not on file   Allergies  Allergen Reactions  . Banana Anaphylaxis  . Other Anaphylaxis    Chickpeas  . Augmentin [Amoxicillin-Pot Clavulanate] Hives  . Codeine     Other reaction(s): Vomiting  . Hydrocodone Other (See Comments)  . Penicillins Hives  . Sudafed [Pseudoephedrine Hcl]   . Tape    Current Outpatient Medications on File  Prior to Visit  Medication Sig Dispense Refill  . albuterol (PROAIR HFA) 108 (90 Base) MCG/ACT inhaler INHALE 2 PUFFS INTO THE LUNGS EVERY 4 HOURS AS NEEDED FOR WHEEZING OR SHORTNESS OF BREATH 8.5 g 0  . buPROPion (WELLBUTRIN XL) 150 MG 24 hr tablet Take 1 tablet (150 mg total) by mouth daily. Take one tablet by mouth each morning. 90 tablet 1  . dextroamphetamine (DEXEDRINE) 10 MG 24 hr capsule Take 1-3 capsules po qd prn on days off 90 capsule 0  . EPINEPHrine 0.3 mg/0.3 mL IJ SOAJ injection Inject 0.3 mLs (0.3 mg total) into the muscle once. 1 Device 0  . fluticasone (FLONASE) 50 MCG/ACT nasal spray Place into both nostrils daily.    Marland Kitchen. lisdexamfetamine (VYVANSE) 50 MG capsule Take 1 capsule (50 mg total) by mouth daily. 30 capsule 0  . nitrofurantoin, macrocrystal-monohydrate, (MACROBID) 100 MG capsule Take one BID x 7 days the take one after intercourse prn. 30 capsule 3  . NUVARING 0.12-0.015 MG/24HR vaginal ring Insert nuvaring vaginally, leave in for three weeks, remove for one week for menses.  Repeat 3 each 3  . Prenatal Vit-Fe Fumarate-FA (PRENATAL VITAMINS) 28-0.8 MG TABS Take by mouth.    . ranitidine (ZANTAC) 75 MG tablet Take by mouth daily as needed.     . traZODone (DESYREL) 50 MG tablet Take 1-2 tabs po QHS prn insomnia 180 tablet 1  . vortioxetine HBr (TRINTELLIX) 5 MG TABS tablet Take 1 tablet (5 mg total) by mouth daily. 90 tablet 1  . dextromethorphan-guaiFENesin (MUCINEX DM) 30-600 MG 12hr tablet Take 1 tablet by mouth 2 (two) times daily. 20 tablet 0  . diazepam (VALIUM) 5 MG tablet Take by mouth.    Debra Garza. [START ON 05/16/2018] lisdexamfetamine (VYVANSE) 50 MG capsule Take 1 capsule (50 mg total) by mouth daily. 30 capsule 0  . rizatriptan (MAXALT) 10 MG tablet Take 1 tablet (10 mg total) by mouth as needed for migraine. May repeat in 2 hours if needed (Patient not taking: Reported on 05/08/2018) 10 tablet 0  . vortioxetine HBr (TRINTELLIX) 5 MG TABS tablet Take 1 tablet (5 mg  total) by mouth daily. 30 tablet 0  . VYVANSE 50 MG capsule Take 1 capsule (50 mg total) by mouth daily for 30 days. 30 capsule 0   No current facility-administered medications on file prior to visit.    Patient was born prematurely and had a septal defect which closed spontaneously. There were no other genetic or congenital defects in her or her husband's family. Discussed advanced maternal age and that the risks of congenital malformations slowly increases the older women are when  they deliver. Explained screening tests like cell free DNA testing and first trimester testing to screen for aneuploidies as well as amniocentesis for diagnostic purposes. She is currently taking multivitamins and I encouraged continuing these  We discussed the use of her current medications in pregnancy: Dexedrine and Vyvanse-no increased risk of congenital abnormalities when used for medical reasons. Baby may have mild withdrawal symptoms. Wellbutrin for depression-advised there are no congenital abnormalities associated with this use in pregnancy Trazadone: can be used for insomnia in pregnancy Trintellix: no human studies, but no congenital abnormalities in animal studies. Advised that some SSRIs have been associated with problems like persistent pulmonary hypertension (Paxil) and others are not (Zoloft, Prozac) and have been used more commonly in pregnancy. There is a possibility of withdrawal symptoms in the newborn like irritability and feeding problems, and occasionally seizures.  It is recommended that patient sign up on registry if she uses this in pregnancy. Should discuss use of another SSRI with her mental health provider.  Risks and benefits need to be weighed with all medication use in pregnancy.    Observations/Objective Physical Exam could not be performed. Because of the COVID-19 outbreak this visit was performed over the phone and not in person.   Assessment and Plan: Preconceptual  counseling  Follow Up Instructions: When ready to conceive, stop use of Nuvaring when on menses.  Call when she has a positive UPT to start prenatal care Her current medications are not absolutely contraindicated in pregnancy, but not much is known regarding Trintellix. She may wish to discuss changing this to another SSRI during pregnancy with her mental health provider.    I discussed the assessment and treatment plan with the patient. The patient was provided an opportunity to ask questions and all were answered. The patient agreed with the plan and demonstrated an understanding of the instructions.   I provided 30 minutes of non-face-to-face time during this encounter.  Farrel Conners, CNM [facility], [facility] 05/09/2018 3:10 PM

## 2018-05-09 ENCOUNTER — Encounter: Payer: Self-pay | Admitting: Certified Nurse Midwife

## 2018-05-10 NOTE — Telephone Encounter (Signed)
Provider was running behind & patient was contacted for her televisit shortly after message was received.

## 2018-05-22 ENCOUNTER — Other Ambulatory Visit: Payer: Self-pay | Admitting: Certified Nurse Midwife

## 2018-05-22 MED ORDER — ETONOGESTREL-ETHINYL ESTRADIOL 0.12-0.015 MG/24HR VA RING: VAGINAL_RING | VAGINAL | 1 refills | Status: DC

## 2018-06-06 ENCOUNTER — Other Ambulatory Visit: Payer: Self-pay

## 2018-06-06 ENCOUNTER — Encounter: Payer: Self-pay | Admitting: Psychiatry

## 2018-06-06 ENCOUNTER — Ambulatory Visit (INDEPENDENT_AMBULATORY_CARE_PROVIDER_SITE_OTHER): Payer: 59 | Admitting: Psychiatry

## 2018-06-06 DIAGNOSIS — F330 Major depressive disorder, recurrent, mild: Secondary | ICD-10-CM | POA: Diagnosis not present

## 2018-06-06 DIAGNOSIS — F419 Anxiety disorder, unspecified: Secondary | ICD-10-CM | POA: Diagnosis not present

## 2018-06-06 DIAGNOSIS — F902 Attention-deficit hyperactivity disorder, combined type: Secondary | ICD-10-CM | POA: Diagnosis not present

## 2018-06-06 DIAGNOSIS — F5101 Primary insomnia: Secondary | ICD-10-CM | POA: Diagnosis not present

## 2018-06-06 MED ORDER — FLUOXETINE HCL 10 MG PO CAPS: ORAL_CAPSULE | ORAL | 1 refills | Status: DC

## 2018-06-06 MED ORDER — LISDEXAMFETAMINE DIMESYLATE 50 MG PO CAPS: 50.0000 mg | ORAL_CAPSULE | Freq: Every day | ORAL | 0 refills | Status: DC

## 2018-06-06 NOTE — Progress Notes (Signed)
Debra Garza [id number] 17-Apr-1980 37 y.o.  Virtual Visit via Telephone Note  I connected with pt on 06/06/18 at  9:30 AM EDT by telephone and verified that I am speaking with the correct person using two identifiers.   I discussed the limitations, risks, security and privacy concerns of performing an evaluation and management service by telephone and the availability of in person appointments. I also discussed with the patient that there may be a patient responsible charge related to this service. The patient expressed understanding and agreed to proceed.   I discussed the assessment and treatment plan with the patient. The patient was provided an opportunity to ask questions and all were answered. The patient agreed with the plan and demonstrated an understanding of the instructions.   The patient was advised to call back or seek an in-person evaluation if the symptoms worsen or if the condition fails to improve as anticipated.  I provided 30 minutes of non-face-to-face time during this encounter.  The patient was located at home.  The provider was located at home.   Corie Chiquito, PMHNP   Subjective:   Patient ID:  Debra Garza is a 38 y.o. (DOB [birth year]) female.  Chief Complaint:  Chief Complaint  Patient presents with  . Depression  . ADD  . Anxiety  . Insomnia    HPI [name] presents for follow-up of mood, anxiety, insomnia, and ADD.  Her husband, [name], also participates in exam at times. She reports that her unit became a designated COVID unit and she was having some increased anxiety at that time. She reports that her anxiety has been "ok" overall. 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 Denies panic attacks. Denies any recent "ups or downs." Denies sad mood and describes her mood as "blah." Husband reports that he has some concerns about her mood. He notices that her motivation is low.  Energy has been somewhat lower. She reports that she is forgetful and does not have a usual routine. Some worsening in concentration. She reports that her appetite has been stable. She reports sleep is fair. Husband works 3:30 pm- midnight and then stays up with him when he comes home. Typically awakens around 2 pm. Denies anhedonia. Denies SI.   Husband reports that she frequently forgets to take medications and patient agrees with this. She reports that she is occ sleeping large amounts of time, such as for 16 hours with brief period of awakening. Husband reports that it occurs every few weeks and that it was worse and has improved slightly. Husband notices her motivation is better when taking ADD meds.  She reports that she was married on 04/01/18. She reports that she plans to change her name after the pandemic.  Patient reports that she and her husband may try to conceive starting in the next few months and she would like to discuss benefits and risks of her current meds during pregnancy.   Past medication trials: Vyvanse-effective Dexedrine-effective Methylphenidate-effective for only brief periods of time Concerta-increased blood pressure Strattera-increased blood pressure Mirtazapine-low energy Wellbutrin XL-helpful for mood at 150 mg daily. Had increased anxiety at 300 mg dose. Trintellix-Has been helpful for mood and anxiety.   Review of Systems:  Review of Systems  Musculoskeletal: Negative for gait problem.  Neurological: Negative for tremors.  Psychiatric/Behavioral:       Please refer to HPI    Medications: I have reviewed the  patient's current medications.  Current Outpatient Medications  Medication Sig Dispense Refill  . albuterol (PROAIR HFA) 108 (90 Base) MCG/ACT inhaler INHALE 2 PUFFS INTO THE LUNGS EVERY 4 HOURS AS NEEDED FOR WHEEZING OR SHORTNESS OF BREATH 8.5 g 0  . dextroamphetamine (DEXEDRINE) 10 MG 24 hr capsule Take 1-3 capsules po qd prn on days off 90  capsule 0  . EPINEPHrine 0.3 mg/0.3 mL IJ SOAJ injection Inject 0.3 mLs (0.3 mg total) into the muscle once. 1 Device 0  . etonogestrel-ethinyl estradiol (NUVARING) 0.12-0.015 MG/24HR vaginal ring Insert vaginally and leave in place for 3 consecutive weeks, then remove for 1 week. 3 each 1  . fluticasone (FLONASE) 50 MCG/ACT nasal spray Place into both nostrils daily.    Marland Kitchen lisdexamfetamine (VYVANSE) 50 MG capsule Take 1 capsule (50 mg total) by mouth daily. 30 capsule 0  . lisdexamfetamine (VYVANSE) 50 MG capsule Take 1 capsule (50 mg total) by mouth daily. 30 capsule 0  . nitrofurantoin, macrocrystal-monohydrate, (MACROBID) 100 MG capsule Take one BID x 7 days the take one after intercourse prn. 30 capsule 3  . Prenatal Vit-Fe Fumarate-FA (PRENATAL VITAMINS) 28-0.8 MG TABS Take by mouth.    . ranitidine (ZANTAC) 75 MG tablet Take by mouth daily as needed.     . traZODone (DESYREL) 50 MG tablet Take 1-2 tabs po QHS prn insomnia 180 tablet 1  . buPROPion (WELLBUTRIN XL) 150 MG 24 hr tablet Take 1 tablet (150 mg total) by mouth daily. Take one tablet by mouth each morning. 90 tablet 1  . FLUoxetine (PROZAC) 10 MG capsule Take 1 capsule po qd x 7-10 days, then increase to 2 capsules po qd 60 capsule 1  . rizatriptan (MAXALT) 10 MG tablet Take 1 tablet (10 mg total) by mouth as needed for migraine. May repeat in 2 hours if needed (Patient not taking: Reported on 05/08/2018) 10 tablet 0   No current facility-administered medications for this visit.     Medication Side Effects: None  Allergies:  Allergies  Allergen Reactions  . Banana Anaphylaxis  . Other Anaphylaxis    Chickpeas  . Augmentin [Amoxicillin-Pot Clavulanate] Hives  . Codeine     Other reaction(s): Vomiting  . Hydrocodone Other (See Comments)  . Penicillins Hives  . Sudafed [Pseudoephedrine Hcl]   . Tape     Past Medical History:  Diagnosis Date  . ADD (attention deficit disorder)   . Allergic rhinitis   . Anxiety   .  Asthma   . Depression    DR. KAUR  . Insomnia   . Migraine   . Other abnormal glucose    SEASONAL ALLERGIES  . Panic attacks   . Strabismic amblyopia of right eye     Family History  Problem Relation Age of Onset  . Healthy Mother   . Cancer Mother        BASEL CELL 40  . Breast cancer Mother        DCIS  . Healthy Father   . Hyperlipidemia Father   . Cancer Father        BASEL CELL 40  . ADD / ADHD Father   . Healthy Sister   . Depression Sister   . Liver cancer Maternal Grandmother   . Cancer Maternal Grandmother        COLON  . Schizophrenia Maternal Grandmother   . Dementia Maternal Grandfather     Social History   Socioeconomic History  . Marital status: Married  Spouse name: [name]  . Number of children: 0  . Years of education: 78  . Highest education level: Not on file  Occupational History  . Occupation: NURSE  Social Needs  . Financial resource strain: Not on file  . Food insecurity:    Worry: Not on file    Inability: Not on file  . Transportation needs:    Medical: Not on file    Non-medical: Not on file  Tobacco Use  . Smoking status: Never Smoker  . Smokeless tobacco: Never Used  Substance and Sexual Activity  . Alcohol use: Yes    Alcohol/week: 1.0 - 2.0 standard drinks    Types: 1 - 2 Cans of beer per week    Comment: occasionally  . Drug use: No  . Sexual activity: Yes    Partners: Male    Birth control/protection: Other-see comments    Comment: Nuvaring  Lifestyle  . Physical activity:    Days per week: Not on file    Minutes per session: Not on file  . Stress: Not on file  Relationships  . Social connections:    Talks on phone: Not on file    Gets together: Not on file    Attends religious service: Not on file    Active member of club or organization: Not on file    Attends meetings of clubs or organizations: Not on file    Relationship status: Not on file  . Intimate partner violence:    Fear of current or ex partner:  Not on file    Emotionally abused: Not on file    Physically abused: Not on file    Forced sexual activity: Not on file  Other Topics Concern  . Not on file  Social History Narrative  . Not on file    Past Medical History, Surgical history, Social history, and Family history were reviewed and updated as appropriate.   Please see review of systems for further details on the patient's review from today.   Objective:   Physical Exam:  There were no vitals taken for this visit.  Physical Exam Neurological:     Mental Status: She is alert and oriented to person, place, and time.     Cranial Nerves: No dysarthria.  Psychiatric:        Attention and Perception: Attention normal.        Mood and Affect: Mood is depressed.        Speech: Speech normal.        Behavior: Behavior is cooperative.        Thought Content: Thought content normal. Thought content is not paranoid or delusional. Thought content does not include homicidal or suicidal ideation. Thought content does not include homicidal or suicidal plan.        Cognition and Memory: Cognition and memory normal.        Judgment: Judgment normal.     Lab Review:     Component Value Date/Time   NA 134 (L) 10/23/2015 0146   K 3.3 (L) 10/23/2015 0146   CL 103 10/23/2015 0146   CO2 23 10/23/2015 0146   GLUCOSE 125 (H) 10/23/2015 0146   BUN 11 10/23/2015 0146   CREATININE 0.90 10/23/2015 0146   CALCIUM 9.4 10/23/2015 0146   GFRNONAA >60 10/23/2015 0146   GFRAA >60 10/23/2015 0146       Component Value Date/Time   WBC 7.1 04/05/2017 1206   WBC 9.2 10/23/2015 0146   RBC 4.87 04/05/2017  1206   RBC 5.42 (H) 10/23/2015 0146   HGB 14.3 04/05/2017 1206   HCT 43.0 04/05/2017 1206   PLT 291 04/05/2017 1206   MCV 88 04/05/2017 1206   MCH 29.4 04/05/2017 1206   MCH 29.6 10/23/2015 0146   MCHC 33.3 04/05/2017 1206   MCHC 34.9 10/23/2015 0146   RDW 12.8 04/05/2017 1206   LYMPHSABS 2.9 04/05/2017 1206   EOSABS 0.3 04/05/2017  1206   BASOSABS 0.0 04/05/2017 1206    No results found for: POCLITH, LITHIUM   No results found for: PHENYTOIN, PHENOBARB, VALPROATE, CBMZ   .res Assessment: Plan:   Discussed potential benefits, risk, and side effects of current medications during pregnancy.  Discussed that stimulants are not recommended during pregnancy due to potential risk of vasoconstriction with the placenta, as well as decreased appetite that could affect fetal growth.  Recommended continuation of stimulants during pregnancy and that dose of stimulant can be lowered prior to trying to conceive to minimize discontinuation effects from stimulant.  Patient is in agreement with this plan.  Will continue Vyvanse 50 mg p.o. every morning on workdays and Dexedrine on the days that she is not working at this time and will consider dose reduction in Vyvanse closer to the time patient plans to start to try to conceive. Discussed that Trintellix is a newer medication and therefore there is less data available regarding pregnancy outcomes of mothers who took Trintellix.  Discussed switching Trintellix to Prozac since depressive signs and symptoms are currently not fully controlled and instead of increasing Trintellix switching to Prozac since it has more safety data available.  Discussed that Prozac also has a long half-life and blood levels would not drop significantly when patient forgets a dose or is not able to take it consistently due to shift work.  Discussed potential benefits, risks, and side effects of Prozac.  Will start Prozac 10 mg daily for 7 to 10 days, then increase to 20 mg daily for depression and anxiety. Will continue Wellbutrin XL 150 mg daily and may consider discontinuation of Wellbutrin if mood signs and symptoms are adequately controlled with Prozac. Continue trazodone as needed for insomnia. Patient to follow-up in 4 weeks or sooner if clinically indicated. Patient advised to contact office with any questions,  adverse effects, or acute worsening in signs and symptoms.  Mild episode of recurrent major depressive disorder (HCC) - Plan: FLUoxetine (PROZAC) 10 MG capsule  Attention deficit hyperactivity disorder (ADHD), combined type - Plan: lisdexamfetamine (VYVANSE) 50 MG capsule  Anxiety - Plan: FLUoxetine (PROZAC) 10 MG capsule  Primary insomnia  Please see After Visit Summary for patient specific instructions.  Future Appointments  Date Time Provider Department Center  09/05/2018  9:00 AM Corie Chiquitoarter, Jaeli Grubb, PMHNP [facility] None    No orders of the defined types were placed in this encounter.     -------------------------------

## 2018-06-27 ENCOUNTER — Other Ambulatory Visit: Payer: Self-pay | Admitting: Family Medicine

## 2018-06-27 ENCOUNTER — Other Ambulatory Visit: Payer: Self-pay

## 2018-06-27 ENCOUNTER — Ambulatory Visit
Admission: RE | Admit: 2018-06-27 | Discharge: 2018-06-27 | Disposition: A | Discharge status: Home / Self Care | Payer: PRIVATE HEALTH INSURANCE | Source: Ambulatory Visit | Attending: Family Medicine | Admitting: Family Medicine

## 2018-06-27 DIAGNOSIS — R52 Pain, unspecified: Secondary | ICD-10-CM

## 2018-06-27 DIAGNOSIS — M25571 Pain in right ankle and joints of right foot: Secondary | ICD-10-CM | POA: Diagnosis not present

## 2018-06-27 DIAGNOSIS — S99911A Unspecified injury of right ankle, initial encounter: Secondary | ICD-10-CM | POA: Diagnosis not present

## 2018-06-30 ENCOUNTER — Telehealth: Payer: Self-pay | Admitting: Psychiatry

## 2018-06-30 NOTE — Telephone Encounter (Signed)
Debra Garza called to report that when she increased her prozac from 10mg  to 20mg  she experienced nausea.  So she went back the 10mg .

## 2018-07-03 NOTE — Telephone Encounter (Signed)
Pt. Made aware.

## 2018-08-04 ENCOUNTER — Telehealth: Payer: Self-pay | Admitting: Psychiatry

## 2018-08-04 NOTE — Telephone Encounter (Signed)
Pt called need ASAP Appt . Put on leave yesterday due to ADD effecting job. Have until next Wednesday to get a plan in place. Depression under control but ADD out of control. Got to get this together before able to have a child.   Please help with a letter,advise &/or med change. No appt ava at this time [phone]

## 2018-08-09 ENCOUNTER — Other Ambulatory Visit: Payer: Self-pay

## 2018-08-09 ENCOUNTER — Encounter: Payer: Self-pay | Admitting: Psychiatry

## 2018-08-09 ENCOUNTER — Ambulatory Visit: Payer: 59 | Admitting: Psychiatry

## 2018-08-09 VITALS — BP 144/89 | HR 125 | Wt 98.0 lb

## 2018-08-09 DIAGNOSIS — F902 Attention-deficit hyperactivity disorder, combined type: Secondary | ICD-10-CM

## 2018-08-09 DIAGNOSIS — F330 Major depressive disorder, recurrent, mild: Secondary | ICD-10-CM

## 2018-08-09 DIAGNOSIS — F419 Anxiety disorder, unspecified: Secondary | ICD-10-CM | POA: Diagnosis not present

## 2018-08-09 MED ORDER — BUPROPION HCL ER (XL) 150 MG PO TB24: 150.0000 mg | ORAL_TABLET | Freq: Every day | ORAL | 1 refills | Status: DC

## 2018-08-09 MED ORDER — PROPRANOLOL HCL 10 MG PO TABS: ORAL_TABLET | ORAL | 1 refills | Status: DC

## 2018-08-09 MED ORDER — FLUOXETINE HCL 10 MG PO CAPS: 10.0000 mg | ORAL_CAPSULE | Freq: Every day | ORAL | 0 refills | Status: DC

## 2018-08-09 MED ORDER — JORNAY PM 40 MG PO CP24: 40.0000 mg | ORAL_CAPSULE | Freq: Every morning | ORAL | 0 refills | Status: DC

## 2018-08-09 NOTE — Progress Notes (Signed)
Debra Garza [id number] Sep 06, 1980 37 y.o.  Subjective:   Patient ID:  Debra Garza is a 38 y.o. (DOB 1980-05-21) female.  Chief Complaint:  Chief Complaint  Patient presents with  . Follow-up    Medication management  . Depression    Medication management  . ADHD    Medication management  . Medication Problem    Increased heart rate    HPI Debra Garza presents to the office today for emergent follow-up after recent issues at work, being placed on action plan and administrative leave, and needing to have a plan in place. Pt brings copy of action plan from manager that states she has had issues with tardiness, "prioritization skills and time management," not completing tasks, missing details, presenting as stressed and overwhelmed, not focusing on patient and visitor's concerns.  She reports improved mood with Prozac and reports that mood has been depressed since recent issues at work. She reports that her anxiety is "controlled." She reports recent work stress with high acuity caseload. Denies panic s/s. She reports that she has been sleeping well. Reports that she is not sleeping as excessively on days off. Appetite is stable. She reports motivation has been ok and energy is fair. She reports that her concentration is improved when she takes medication. Reports that she will occasionally hyper-focus. Denies SI.   Has been working days most recently. She reports that she has been consistent with taking Prozac and her stimulant. Reports that she is occasionally taking Wellbutrin XL.   She reports that they have decided to postpone trying to conceive until work stressors improve.   Past medication trials: Vyvanse-effective Dexedrine-effective Adderall- mood swings, tearfulness and irritability Methylphenidate-effective for only brief periods of time Concerta-increased blood pressure Strattera-increased blood pressure. Ineffective.  Clonidine- Severe  drowsiness Mirtazapine-low energy Wellbutrin XL-helpful for mood at 150 mg daily. Had increased anxiety at 300 mg dose. Trintellix-Has been helpful for mood and anxiety.  Prozac   Review of Systems:  Review of Systems  Cardiovascular: Negative for palpitations.  Musculoskeletal: Negative for gait problem.  Neurological: Negative for tremors.  Psychiatric/Behavioral:       Please refer to HPI   Reports taking 3 Dexedrine today to focus on a particular task. Also had espresso.  Medications: I have reviewed the patient's current medications.  Current Outpatient Medications  Medication Sig Dispense Refill  . albuterol (PROAIR HFA) 108 (90 Base) MCG/ACT inhaler INHALE 2 PUFFS INTO THE LUNGS EVERY 4 HOURS AS NEEDED FOR WHEEZING OR SHORTNESS OF BREATH 8.5 g 0  . buPROPion (WELLBUTRIN XL) 150 MG 24 hr tablet Take 1 tablet (150 mg total) by mouth daily. Take one tablet by mouth each morning. 90 tablet 1  . dextroamphetamine (DEXEDRINE) 10 MG 24 hr capsule Take 1-3 capsules po qd prn on days off 90 capsule 0  . EPINEPHrine 0.3 mg/0.3 mL IJ SOAJ injection Inject 0.3 mLs (0.3 mg total) into the muscle once. 1 Device 0  . etonogestrel-ethinyl estradiol (NUVARING) 0.12-0.015 MG/24HR vaginal ring Insert vaginally and leave in place for 3 consecutive weeks, then remove for 1 week. 3 each 1  . FLUoxetine (PROZAC) 10 MG capsule Take 1 capsule (10 mg total) by mouth daily. 90 capsule 0  . fluticasone (FLONASE) 50 MCG/ACT nasal spray Place into both nostrils daily.    . nitrofurantoin, macrocrystal-monohydrate, (MACROBID) 100 MG capsule Take one BID x 7 days the take one after intercourse prn. 30 capsule 3  . Prenatal Vit-Fe Fumarate-FA (PRENATAL VITAMINS) 28-0.8  MG TABS Take by mouth.    . rizatriptan (MAXALT) 10 MG tablet Take 1 tablet (10 mg total) by mouth as needed for migraine. May repeat in 2 hours if needed 10 tablet 0  . traZODone (DESYREL) 50 MG tablet Take 1-2 tabs po QHS prn insomnia 180  tablet 1  . Methylphenidate HCl ER, PM, (JORNAY PM) 40 MG CP24 Take 40 mg by mouth every morning. 30 capsule 0  . propranolol (INDERAL) 10 MG tablet Take 1-2 tabs po BID prn anxiety 120 tablet 1   No current facility-administered medications for this visit.     Medication Side Effects: Nausea. She reports that she had severe nausea and some constipation on Prozac 20 mg and this subsided with decrease in dose to 10 mg po qd. Reports hyper-focus with dexedrine.   Allergies:  Allergies  Allergen Reactions  . Banana Anaphylaxis  . Other Anaphylaxis    Chickpeas  . Augmentin [Amoxicillin-Pot Clavulanate] Hives  . Codeine     Other reaction(s): Vomiting  . Hydrocodone Other (See Comments)  . Penicillins Hives  . Sudafed [Pseudoephedrine Hcl]   . Tape     Past Medical History:  Diagnosis Date  . ADD (attention deficit disorder)   . Allergic rhinitis   . Anxiety   . Asthma   . Depression    DR. KAUR  . Insomnia   . Migraine   . Other abnormal glucose    SEASONAL ALLERGIES  . Panic attacks   . Strabismic amblyopia of right eye     Family History  Problem Relation Age of Onset  . Healthy Mother   . Cancer Mother        BASEL CELL 40  . Breast cancer Mother        DCIS  . Healthy Father   . Hyperlipidemia Father   . Cancer Father        BASEL CELL 40  . ADD / ADHD Father   . Healthy Sister   . Depression Sister   . Liver cancer Maternal Grandmother   . Cancer Maternal Grandmother        COLON  . Schizophrenia Maternal Grandmother   . Dementia Maternal Grandfather     Social History   Socioeconomic History  . Marital status: Married    Spouse name: [name]  . Number of children: 0  . Years of education: 5716  . Highest education level: Not on file  Occupational History  . Occupation: NURSE  Social Needs  . Financial resource strain: Not on file  . Food insecurity    Worry: Not on file    Inability: Not on file  . Transportation needs    Medical: Not on file     Non-medical: Not on file  Tobacco Use  . Smoking status: Never Smoker  . Smokeless tobacco: Never Used  Substance and Sexual Activity  . Alcohol use: Yes    Alcohol/week: 1.0 - 2.0 standard drinks    Types: 1 - 2 Cans of beer per week    Comment: occasionally  . Drug use: No  . Sexual activity: Yes    Partners: Male    Birth control/protection: Other-see comments    Comment: Nuvaring  Lifestyle  . Physical activity    Days per week: Not on file    Minutes per session: Not on file  . Stress: Not on file  Relationships  . Social connections    Talks on phone: Not on file  Gets together: Not on file    Attends religious service: Not on file    Active member of club or organization: Not on file    Attends meetings of clubs or organizations: Not on file    Relationship status: Not on file  . Intimate partner violence    Fear of current or ex partner: Not on file    Emotionally abused: Not on file    Physically abused: Not on file    Forced sexual activity: Not on file  Other Topics Concern  . Not on file  Social History Narrative  . Not on file    Past Medical History, Surgical history, Social history, and Family history were reviewed and updated as appropriate.   Please see review of systems for further details on the patient's review from today.   Objective:   Physical Exam:  BP (!) 144/89   Pulse (!) 125   Wt 98 lb (44.5 kg)   BMI 17.36 kg/m   Physical Exam Constitutional:      General: She is not in acute distress.    Appearance: She is well-developed.  Musculoskeletal:        General: No deformity.  Neurological:     Mental Status: She is alert and oriented to person, place, and time.     Coordination: Coordination normal.  Psychiatric:        Attention and Perception: Perception normal. She is inattentive. She does not perceive auditory or visual hallucinations.        Mood and Affect: Mood is anxious. Mood is not depressed. Affect is not labile,  blunt, angry or inappropriate.        Speech: Speech normal.        Behavior: Behavior normal.        Thought Content: Thought content normal. Thought content does not include homicidal or suicidal ideation. Thought content does not include homicidal or suicidal plan.        Cognition and Memory: Cognition and memory normal.        Judgment: Judgment normal.     Comments: Insight intact. No delusions.      Lab Review:     Component Value Date/Time   NA 134 (L) 10/23/2015 0146   K 3.3 (L) 10/23/2015 0146   CL 103 10/23/2015 0146   CO2 23 10/23/2015 0146   GLUCOSE 125 (H) 10/23/2015 0146   BUN 11 10/23/2015 0146   CREATININE 0.90 10/23/2015 0146   CALCIUM 9.4 10/23/2015 0146   GFRNONAA >60 10/23/2015 0146   GFRAA >60 10/23/2015 0146       Component Value Date/Time   WBC 7.1 04/05/2017 1206   WBC 9.2 10/23/2015 0146   RBC 4.87 04/05/2017 1206   RBC 5.42 (H) 10/23/2015 0146   HGB 14.3 04/05/2017 1206   HCT 43.0 04/05/2017 1206   PLT 291 04/05/2017 1206   MCV 88 04/05/2017 1206   MCH 29.4 04/05/2017 1206   MCH 29.6 10/23/2015 0146   MCHC 33.3 04/05/2017 1206   MCHC 34.9 10/23/2015 0146   RDW 12.8 04/05/2017 1206   LYMPHSABS 2.9 04/05/2017 1206   EOSABS 0.3 04/05/2017 1206   BASOSABS 0.0 04/05/2017 1206    No results found for: POCLITH, LITHIUM   No results found for: PHENYTOIN, PHENOBARB, VALPROATE, CBMZ   .res Assessment: Plan:   Patient seen for over 45 minutes and greater than 50% of visit spent counseling patient regarding recent concerns identified in work action plan and determining ways where  a change in treatment may help improve work performance.  Reviewed written action plan during visit and discussed specific symptoms that have been identified by employer.  Discussed potential benefits, risks, and side effects of several treatment options for attention deficit to include jornay, Adhansia XR, and Maydayis since current medications are not adequately  controlling ADD signs and symptoms and patient is experiencing elevated heart rate with current dose (patient typically is seen in the morning and has attributed increased heart rate to caffeine use, however pulse is also elevated today during an afternoon appointment).  Discussed that Oneida Alar may be helpful with addressing tardiness and organization and concentration when receiving reports at the start of her shift since [place] is taken the night before and has efficacy upon awakening which may help patient be more focused and able to get to work on time and organize and structure her day.  Discussed there are also other alternatives that have a combination of immediate and extended release to allow for longer duration of response if patient is working longer shifts. Patient agrees to trial of Jornay PM and will start Jornay 40 mg po q evening for ADD. Discussed adding propanolol 10 mg 1-2 tabs p.o. twice daily as needed anxiety since this may also help lower increased heart rate and anxiety which would also help with concentration and employer's concerns that patient often presents as overwhelmed.  Discussed that elevated heart rate may also have contributed to anxiety in the past.  Discussed potential benefits, risks, and side effects of propanolol. Will continue Prozac 10 mg daily for anxiety and depression. Will continue Wellbutrin XL for depression. Continue trazodone for insomnia. Patient was also counseled on other possible interventions to address symptoms and identified concerns about her work performance to include considering intermittent or continuous FMLA if needed while medications are being titrated to effective dose and attempting to manage side effects.  Encourage patient to consider if any particular work accommodations would help manage anxiety and attention deficit signs and symptoms.  Discussed that it may also be helpful to see a therapist that may be able to help with managing signs and  symptoms of ADD and anxiety, and also assist with problem solving and organization and time management strategies.  Patient agrees with seeing a therapist and was assisted with scheduling an appointment to see Lanetta Inch, [facility]. Patient to follow-up with this provider in 4 weeks or sooner if clinically indicated. Patient advised to contact office with any questions, adverse effects, or acute worsening in signs and symptoms.  Debra Garza was seen today for follow-up, depression, adhd and medication problem.  Diagnoses and all orders for this visit:  Attention deficit hyperactivity disorder (ADHD), combined type -     Methylphenidate HCl ER, PM, (JORNAY PM) 40 MG CP24; Take 40 mg by mouth every morning.  Mild episode of recurrent major depressive disorder (HCC) -     buPROPion (WELLBUTRIN XL) 150 MG 24 hr tablet; Take 1 tablet (150 mg total) by mouth daily. Take one tablet by mouth each morning. -     FLUoxetine (PROZAC) 10 MG capsule; Take 1 capsule (10 mg total) by mouth daily.  Anxiety -     propranolol (INDERAL) 10 MG tablet; Take 1-2 tabs po BID prn anxiety -     FLUoxetine (PROZAC) 10 MG capsule; Take 1 capsule (10 mg total) by mouth daily.     Please see After Visit Summary for patient specific instructions.  Future Appointments  Date Time Provider [place]  08/30/2018  3:00 PM Waldron SessionAndrews, Christopher, [facility] CP-CP None  09/05/2018  9:00 AM Corie Chiquitoarter, Traniya Prichett, PMHNP CP-CP None    No orders of the defined types were placed in this encounter.   -------------------------------

## 2018-08-11 ENCOUNTER — Telehealth: Payer: Self-pay

## 2018-08-11 NOTE — Telephone Encounter (Signed)
Prior authorization approved for Jornay PM 40 mg through [employer] effective 08/09/2018-08/08/2019

## 2018-08-25 ENCOUNTER — Telehealth: Payer: Self-pay | Admitting: Psychiatry

## 2018-08-25 DIAGNOSIS — F902 Attention-deficit hyperactivity disorder, combined type: Secondary | ICD-10-CM

## 2018-08-25 MED ORDER — JORNAY PM 60 MG PO CP24: 60.0000 mg | ORAL_CAPSULE | Freq: Every morning | ORAL | 0 refills | Status: DC

## 2018-08-25 NOTE — Telephone Encounter (Signed)
Pt. Made aware and said thank you so much.

## 2018-08-25 NOTE — Telephone Encounter (Signed)
PT called to ask if Jornay 40 mg could be increased. Not as focused as well as needed. Please advise

## 2018-08-30 ENCOUNTER — Ambulatory Visit (INDEPENDENT_AMBULATORY_CARE_PROVIDER_SITE_OTHER): Payer: 59 | Admitting: Mental Health

## 2018-08-30 ENCOUNTER — Other Ambulatory Visit: Payer: Self-pay

## 2018-08-30 DIAGNOSIS — F902 Attention-deficit hyperactivity disorder, combined type: Secondary | ICD-10-CM | POA: Diagnosis not present

## 2018-08-30 NOTE — Progress Notes (Signed)
[facility] Counselor Initial Adult Exam  Name: Debra Garza Date: 08/30/18 MRN: [id number] DOB: 03-05-1980 PCP: Margo Common, PA  Time spent: 55 minutes  Guardian/Payee:  none  Paperwork requested:  none  Reason for Visit /Presenting Problem: Pt stated she needs improved coping related to her AD/HD. She struggles w/ time management at work.  Stated her mother copes w/ AD/HD. Recently married in March 2020. Stated she had a cough in February, feels she may have had Covid 19. Since then she feels she has increased problems w/ concentration. She was dx'd w/ strabisnus in December 2019, resulting in headaches due to changes in her vision rx. She and her husband. She is currently in care w/ Thayer Headings NP and is taking medications as rx'd.  Main stress is at work. She stated she had a challenging patient earlier this year which passed. She has been out of work since May 2020 due to a work injury (sprained ankle). 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 Currently she is on administrative leave until September 09, 2018. She struggles w/ getting to work on time, get distracted at home.   Mental Status Exam:   Appearance:   Casual     Behavior:  Appropriate  Motor:  Normal  Speech/Language:   Clear and Coherent  Affect:  constricted  Mood:  anxious and depressed  Thought process:  normal  Thought content:    WNL  Sensory/Perceptual disturbances:    WNL  Orientation:  oriented to person, place, time/date and situation  Attention:  Good  Concentration:  Good  Memory:  WNL  Fund of knowledge:   Good  Insight:    Good  Judgment:   Good  Impulse Control:  Good   Reported Symptoms: Distractible, difficulty focusing/problems with concentration, problems with organization, anxiety, forgetful  Risk Assessment: Danger to Self:  No Self-injurious Behavior: No Danger to Others: No Duty to Warn:no Physical Aggression / Violence:No  Access to Firearms a  concern: No  Gang Involvement:No  Patient / guardian was educated about steps to take if suicide or homicide risk level increases between visits: yes While future psychiatric events cannot be accurately predicted, the patient does not currently require acute inpatient psychiatric care and does not currently meet [facility] involuntary commitment criteria.  Substance Abuse History: Current substance abuse: No    Medical History/Surgical History:  Past Medical History:  Diagnosis Date  . ADD (attention deficit disorder)   . Allergic rhinitis   . Anxiety   . Asthma   . Depression    DR. KAUR  . Insomnia   . Migraine   . Other abnormal glucose    SEASONAL ALLERGIES  . Panic attacks   . Strabismic amblyopia of right eye     Past Surgical History:  Procedure Laterality Date  . STRABISMUS SURGERY Bilateral 01/04/2018    Medications: Current Outpatient Medications  Medication Sig Dispense Refill  . albuterol (PROAIR HFA) 108 (90 Base) MCG/ACT inhaler INHALE 2 PUFFS INTO THE LUNGS EVERY 4 HOURS AS NEEDED FOR WHEEZING OR SHORTNESS OF BREATH 8.5 g 0  . buPROPion (WELLBUTRIN XL) 150 MG 24 hr tablet Take 1 tablet (150 mg total) by mouth daily. Take one tablet by mouth each morning. 90 tablet 1  . dextroamphetamine (DEXEDRINE) 10 MG 24 hr capsule Take 1-3 capsules po qd prn on days off (Patient not taking: Reported on 09/05/2018) 90 capsule 0  . EPINEPHrine 0.3 mg/0.3 mL IJ SOAJ injection  Inject 0.3 mLs (0.3 mg total) into the muscle once. 1 Device 0  . etonogestrel-ethinyl estradiol (NUVARING) 0.12-0.015 MG/24HR vaginal ring Insert vaginally and leave in place for 3 consecutive weeks, then remove for 1 week. 3 each 1  . FLUoxetine (PROZAC) 10 MG capsule Take 1 capsule (10 mg total) by mouth daily. 90 capsule 0  . fluticasone (FLONASE) 50 MCG/ACT nasal spray Place into both nostrils daily.    . Methylphenidate HCl ER, PM, (JORNAY PM) 60 MG CP24 Take 60 mg by mouth every morning. 30  capsule 0  . [START ON 09/22/2018] Methylphenidate HCl ER, PM, (JORNAY PM) 60 MG CP24 Take 60 mg by mouth every evening. 30 capsule 0  . [START ON 10/20/2018] Methylphenidate HCl ER, PM, (JORNAY PM) 60 MG CP24 Take 60 mg by mouth every evening. 30 capsule 0  . nitrofurantoin, macrocrystal-monohydrate, (MACROBID) 100 MG capsule Take one BID x 7 days the take one after intercourse prn. 30 capsule 3  . Prenatal Vit-Fe Fumarate-FA (PRENATAL VITAMINS) 28-0.8 MG TABS Take by mouth.    . propranolol (INDERAL) 10 MG tablet Take 1-2 tabs po BID prn anxiety 120 tablet 1  . rizatriptan (MAXALT) 10 MG tablet Take 1 tablet (10 mg total) by mouth as needed for migraine. May repeat in 2 hours if needed 10 tablet 0  . traZODone (DESYREL) 50 MG tablet Take 1-2 tabs po QHS prn insomnia (Patient not taking: Reported on 09/05/2018) 180 tablet 1   No current facility-administered medications for this visit.     Allergies  Allergen Reactions  . Banana Anaphylaxis  . Other Anaphylaxis    Chickpeas  . Augmentin [Amoxicillin-Pot Clavulanate] Hives  . Codeine     Other reaction(s): Vomiting  . Hydrocodone Other (See Comments)  . Penicillins Hives  . Sudafed [Pseudoephedrine Hcl]   . Tape     Diagnoses:    ICD-10-CM   1. Attention deficit hyperactivity disorder (ADHD), combined type  F90.2     Plan of Care:  Complete part II next session.    Waldron Sessionhristopher Margaret Staggs, [facility]

## 2018-09-05 ENCOUNTER — Other Ambulatory Visit: Payer: Self-pay

## 2018-09-05 ENCOUNTER — Ambulatory Visit (INDEPENDENT_AMBULATORY_CARE_PROVIDER_SITE_OTHER): Payer: 59 | Admitting: Psychiatry

## 2018-09-05 ENCOUNTER — Encounter: Payer: Self-pay | Admitting: Psychiatry

## 2018-09-05 VITALS — BP 131/84 | HR 78

## 2018-09-05 DIAGNOSIS — F902 Attention-deficit hyperactivity disorder, combined type: Secondary | ICD-10-CM | POA: Diagnosis not present

## 2018-09-05 DIAGNOSIS — F419 Anxiety disorder, unspecified: Secondary | ICD-10-CM | POA: Diagnosis not present

## 2018-09-05 DIAGNOSIS — F5101 Primary insomnia: Secondary | ICD-10-CM | POA: Diagnosis not present

## 2018-09-05 DIAGNOSIS — F330 Major depressive disorder, recurrent, mild: Secondary | ICD-10-CM

## 2018-09-05 MED ORDER — JORNAY PM 60 MG PO CP24: 60.0000 mg | ORAL_CAPSULE | Freq: Every evening | ORAL | 0 refills | Status: DC

## 2018-09-05 MED ORDER — FLUOXETINE HCL 10 MG PO CAPS: 10.0000 mg | ORAL_CAPSULE | Freq: Every day | ORAL | 0 refills | Status: DC

## 2018-09-05 NOTE — Progress Notes (Signed)
Debra Garza [id number] 05/29/1980 38 y.o.  Subjective:   Patient ID:  Debra Garza is a 38 y.o. (DOB 05/29/1980) female.  Chief Complaint:  Chief Complaint  Patient presents with  . Follow-up    ADD, Anxiety, and Depression    HPI [name] presents to the office today for follow-up of ADD, Depression, and Anxiety. She reports that higher dose of Jornay PM is more effective compared to lower dose of Jornay and Vyvanse. She reports that she notices it is easier to get out the door in the morning. Reports adequate concentration.   Reports that Propranolol has been effective for anxiety and BP. Continues to have some occ worry. Reports that physical s/s of anxiety have improved. Mood charting indicates anxiety is typically mild. She reports that she continues to have some occ irritability, typically in response to anxiety. Reports that irritability is less. Reports that there have been some ups and downs in mood.  Denies elevated mood. Some periods of depressed mood in response to situation with work and "feeling of rejection" when not offered positions after interviews. She reports improved sleep. Sleep amount varies and is typically 6-7 hours a day. Appetite has been good. She reports that her energy and motivation are "ok." Denies SI.  Reports that she is currently seeking to transfer to another unit/assignment within [employer]. Reports that she is interviewing for different positions. Recently sold house. Husband recently dx'd with DM and they are working on making dietary changes.   Past medication trials: Vyvanse-effective Dexedrine-effective Adderall- mood swings, tearfulness and irritability Methylphenidate-effective for only brief periods of time Concerta-increased blood pressure Strattera-increased blood pressure. Ineffective.  Clonidine- Severe drowsiness Mirtazapine-low energy Wellbutrin XL-helpful for mood at 150 mg daily. Had increased anxiety at 300 mg  dose. Trintellix-Has been helpful for mood and anxiety. Prozac  Review of Systems:  Review of Systems  Cardiovascular: Negative for palpitations.       Reports that she has been monitoring BP and pulse and shares log. BP and pulse have been gradually improving.   Musculoskeletal: Negative for gait problem.  Neurological: Negative for tremors.       Improved HA's with Propranolol  Psychiatric/Behavioral:       Please refer to HPI    Medications: I have reviewed the patient's current medications.  Current Outpatient Medications  Medication Sig Dispense Refill  . albuterol (PROAIR HFA) 108 (90 Base) MCG/ACT inhaler INHALE 2 PUFFS INTO THE LUNGS EVERY 4 HOURS AS NEEDED FOR WHEEZING OR SHORTNESS OF BREATH 8.5 g 0  . buPROPion (WELLBUTRIN XL) 150 MG 24 hr tablet Take 1 tablet (150 mg total) by mouth daily. Take one tablet by mouth each morning. 90 tablet 1  . etonogestrel-ethinyl estradiol (NUVARING) 0.12-0.015 MG/24HR vaginal ring Insert vaginally and leave in place for 3 consecutive weeks, then remove for 1 week. 3 each 1  . FLUoxetine (PROZAC) 10 MG capsule Take 1 capsule (10 mg total) by mouth daily. 90 capsule 0  . fluticasone (FLONASE) 50 MCG/ACT nasal spray Place into both nostrils daily.    . Methylphenidate HCl ER, PM, (JORNAY PM) 60 MG CP24 Take 60 mg by mouth every morning. 30 capsule 0  . nitrofurantoin, macrocrystal-monohydrate, (MACROBID) 100 MG capsule Take one BID x 7 days the take one after intercourse prn. 30 capsule 3  . Prenatal Vit-Fe Fumarate-FA (PRENATAL VITAMINS) 28-0.8 MG TABS Take by mouth.    . propranolol (INDERAL) 10 MG tablet Take 1-2 tabs po BID prn anxiety 120  tablet 1  . rizatriptan (MAXALT) 10 MG tablet Take 1 tablet (10 mg total) by mouth as needed for migraine. May repeat in 2 hours if needed 10 tablet 0  . dextroamphetamine (DEXEDRINE) 10 MG 24 hr capsule Take 1-3 capsules po qd prn on days off (Patient not taking: Reported on 09/05/2018) 90 capsule 0  .  EPINEPHrine 0.3 mg/0.3 mL IJ SOAJ injection Inject 0.3 mLs (0.3 mg total) into the muscle once. 1 Device 0  . [START ON 09/22/2018] Methylphenidate HCl ER, PM, (JORNAY PM) 60 MG CP24 Take 60 mg by mouth every evening. 30 capsule 0  . [START ON 10/20/2018] Methylphenidate HCl ER, PM, (JORNAY PM) 60 MG CP24 Take 60 mg by mouth every evening. 30 capsule 0  . traZODone (DESYREL) 50 MG tablet Take 1-2 tabs po QHS prn insomnia (Patient not taking: Reported on 09/05/2018) 180 tablet 1   No current facility-administered medications for this visit.     Medication Side Effects: Other: Skin picking  Allergies:  Allergies  Allergen Reactions  . Banana Anaphylaxis  . Other Anaphylaxis    Chickpeas  . Augmentin [Amoxicillin-Pot Clavulanate] Hives  . Codeine     Other reaction(s): Vomiting  . Hydrocodone Other (See Comments)  . Penicillins Hives  . Sudafed [Pseudoephedrine Hcl]   . Tape     Past Medical History:  Diagnosis Date  . ADD (attention deficit disorder)   . Allergic rhinitis   . Anxiety   . Asthma   . Depression    DR. KAUR  . Insomnia   . Migraine   . Other abnormal glucose    SEASONAL ALLERGIES  . Panic attacks   . Strabismic amblyopia of right eye     Family History  Problem Relation Age of Onset  . Healthy Mother   . Cancer Mother        BASEL CELL 40  . Breast cancer Mother        DCIS  . Healthy Father   . Hyperlipidemia Father   . Cancer Father        BASEL CELL 40  . ADD / ADHD Father   . Healthy Sister   . Depression Sister   . Liver cancer Maternal Grandmother   . Cancer Maternal Grandmother        COLON  . Schizophrenia Maternal Grandmother   . Dementia Maternal Grandfather     Social History   Socioeconomic History  . Marital status: Married    Spouse name: [name]  . Number of children: 0  . Years of education: 216  . Highest education level: Not on file  Occupational History  . Occupation: NURSE  Social Needs  . Financial resource strain: Not  on file  . Food insecurity    Worry: Not on file    Inability: Not on file  . Transportation needs    Medical: Not on file    Non-medical: Not on file  Tobacco Use  . Smoking status: Never Smoker  . Smokeless tobacco: Never Used  Substance and Sexual Activity  . Alcohol use: Yes    Alcohol/week: 1.0 - 2.0 standard drinks    Types: 1 - 2 Cans of beer per week    Comment: occasionally  . Drug use: No  . Sexual activity: Yes    Partners: Male    Birth control/protection: Other-see comments    Comment: Nuvaring  Lifestyle  . Physical activity    Days per week: Not on file  Minutes per session: Not on file  . Stress: Not on file  Relationships  . Social Musicianconnections    Talks on phone: Not on file    Gets together: Not on file    Attends religious service: Not on file    Active member of club or organization: Not on file    Attends meetings of clubs or organizations: Not on file    Relationship status: Not on file  . Intimate partner violence    Fear of current or ex partner: Not on file    Emotionally abused: Not on file    Physically abused: Not on file    Forced sexual activity: Not on file  Other Topics Concern  . Not on file  Social History Narrative  . Not on file    Past Medical History, Surgical history, Social history, and Family history were reviewed and updated as appropriate.   Please see review of systems for further details on the patient's review from today.   Objective:   Physical Exam:  BP 131/84   Pulse 78   Physical Exam Constitutional:      General: She is not in acute distress.    Appearance: She is well-developed.  Musculoskeletal:        General: No deformity.  Neurological:     Mental Status: She is alert and oriented to person, place, and time.     Coordination: Coordination normal.  Psychiatric:        Attention and Perception: Attention and perception normal. She does not perceive auditory or visual hallucinations.        Mood and  Affect: Mood normal. Mood is not anxious or depressed. Affect is not labile, blunt, angry or inappropriate.        Speech: Speech normal.        Behavior: Behavior normal.        Thought Content: Thought content normal. Thought content does not include homicidal or suicidal ideation. Thought content does not include homicidal or suicidal plan.        Cognition and Memory: Cognition and memory normal.        Judgment: Judgment normal.     Comments: Insight intact. No delusions.      Lab Review:     Component Value Date/Time   NA 134 (L) 10/23/2015 0146   K 3.3 (L) 10/23/2015 0146   CL 103 10/23/2015 0146   CO2 23 10/23/2015 0146   GLUCOSE 125 (H) 10/23/2015 0146   BUN 11 10/23/2015 0146   CREATININE 0.90 10/23/2015 0146   CALCIUM 9.4 10/23/2015 0146   GFRNONAA >60 10/23/2015 0146   GFRAA >60 10/23/2015 0146       Component Value Date/Time   WBC 7.1 04/05/2017 1206   WBC 9.2 10/23/2015 0146   RBC 4.87 04/05/2017 1206   RBC 5.42 (H) 10/23/2015 0146   HGB 14.3 04/05/2017 1206   HCT 43.0 04/05/2017 1206   PLT 291 04/05/2017 1206   MCV 88 04/05/2017 1206   MCH 29.4 04/05/2017 1206   MCH 29.6 10/23/2015 0146   MCHC 33.3 04/05/2017 1206   MCHC 34.9 10/23/2015 0146   RDW 12.8 04/05/2017 1206   LYMPHSABS 2.9 04/05/2017 1206   EOSABS 0.3 04/05/2017 1206   BASOSABS 0.0 04/05/2017 1206    No results found for: POCLITH, LITHIUM   No results found for: PHENYTOIN, PHENOBARB, VALPROATE, CBMZ   .res Assessment: Plan:    [place] was seen today for follow-up.  Diagnoses and all  orders for this visit:  Attention deficit hyperactivity disorder (ADHD), combined type -     Methylphenidate HCl ER, PM, (JORNAY PM) 60 MG CP24; Take 60 mg by mouth every evening. -     Methylphenidate HCl ER, PM, (JORNAY PM) 60 MG CP24; Take 60 mg by mouth every evening.  Mild episode of recurrent major depressive disorder (HCC) -     FLUoxetine (PROZAC) 10 MG capsule; Take 1 capsule (10 mg total) by  mouth daily.  Anxiety -     FLUoxetine (PROZAC) 10 MG capsule; Take 1 capsule (10 mg total) by mouth daily.  Primary insomnia    Will continue current plan of care since patient reports will continue current plan of care since patient reports that there is been an overall improvement in anxiety and attention signs and symptoms.  She attributes mild mood signs and symptoms to current situational stressors and plans to address these in therapy. Recommend continuing psychotherapy with Lanetta Inch, LPC. Patient to follow-up with this provider in 2 months or sooner if clinically indicated.  Patient advised to contact office with any questions, adverse effects, or acute worsening in signs and symptoms.   Please see After Visit Summary for patient specific instructions.  Future Appointments  Date Time Provider [place]  09/13/2018  1:00 PM Anson [place], [facility] CP-CP None  10/04/2018  9:00 AM Anson [place], [facility] CP-CP None  11/06/2018  9:00 AM Thayer Headings, PMHNP CP-CP None    No orders of the defined types were placed in this encounter.   -------------------------------

## 2018-09-13 ENCOUNTER — Ambulatory Visit (INDEPENDENT_AMBULATORY_CARE_PROVIDER_SITE_OTHER): Payer: 59 | Admitting: Mental Health

## 2018-09-13 ENCOUNTER — Other Ambulatory Visit: Payer: Self-pay

## 2018-09-13 DIAGNOSIS — F330 Major depressive disorder, recurrent, mild: Secondary | ICD-10-CM
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
## 2018-10-04 ENCOUNTER — Ambulatory Visit (INDEPENDENT_AMBULATORY_CARE_PROVIDER_SITE_OTHER): Payer: 59 | Admitting: Mental Health

## 2018-10-04 ENCOUNTER — Other Ambulatory Visit: Payer: Self-pay

## 2018-10-04 DIAGNOSIS — F902 Attention-deficit hyperactivity disorder, combined type: Secondary | ICD-10-CM | POA: Diagnosis not present

## 2018-10-04 NOTE — Progress Notes (Signed)
[facility] Counsel[facility] Psychotherapy Note  Name: Debra Garza Date: 10/04/18 MRN: [id number] DOB: 11/24/1980 PCP: Margo Common, PA  Time spent: 55 minutes  Treatment: Individual therapy  Mental Status Exam:   Appearance:   Casual     Behavior:  Appropriate  Motor:  Normal  Speech/Language:   Clear and Coherent  Affect:  constricted  Mood:  anxious and depressed  Thought process:  normal  Thought content:    WNL  Sensory/Perceptual disturbances:    WNL  Orientation:  oriented to person, place, time/date and situation  Attention:  Good  Concentration:  Good  Memory:  WNL  Fund of knowledge:   Good  Insight:    Good  Judgment:   Good  Impulse Control:  Good   Reported Symptoms: Distractible, difficulty focusing/problems with concentration, problems with organization, anxiety, forgetful  Risk Assessment: Danger to Self:  No Self-injurious Behavior: No Danger to Others: No Duty to Warn:no Physical Aggression / Violence:No  Access to Firearms a concern: No  Gang Involvement:No  Patient / guardian was educated about steps to take if suicide or homicide risk level increases between visits: yes While future psychiatric events cannot be accurately predicted, the patient does not currently require acute inpatient psychiatric care and does not currently meet [facility] involuntary commitment criteria.  Subjective: Patient arrived on time for today's session.  She shared how she obtained employment giving some details.  Patient was relieved to find employment due to her increased financial stress and concerns.  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  She shared some communication issues that have persisted in her marriage.  Time was spent to allow patient to give details.  She shared the differences personalities she and her husband have as well as  communication styles.  Patient was encouraged to identify ways she can set some boundaries in the relationship as this was identified as a need.  Some ways to communicate in the relationship, to clearly express her feelings and needs were explored.    Interventions: CBT, supportive therapy, problem sovling  Diagnoses:    ICD-10-CM   1. Attention deficit hyperactivity disorder (ADHD), combined type  F90.2     Plan of Care:   1.  Patient to continue to engage in individual counseling 2-4 times a month or as needed. 2.  Patient to identify and apply CBT, coping skills learned in session to decrease symptoms, improving organization / consistency w/ daily routine / scheduling. 3.  Patient to continue medication management services and report any concerns as needed. 4.  Patient to contact this office, go to the local ED or call 911 if a crisis or emergency develops between visits.   Anson [place], [facility]

## 2018-10-11 ENCOUNTER — Other Ambulatory Visit (HOSPITAL_COMMUNITY)
Admission: RE | Admit: 2018-10-11 | Discharge: 2018-10-11 | Disposition: A | Discharge status: Home / Self Care | Payer: 59 | Source: Ambulatory Visit | Attending: Certified Nurse Midwife | Admitting: Certified Nurse Midwife

## 2018-10-11 ENCOUNTER — Encounter: Payer: Self-pay | Admitting: Certified Nurse Midwife

## 2018-10-11 ENCOUNTER — Other Ambulatory Visit: Payer: Self-pay

## 2018-10-11 ENCOUNTER — Ambulatory Visit (INDEPENDENT_AMBULATORY_CARE_PROVIDER_SITE_OTHER): Payer: 59 | Admitting: Certified Nurse Midwife

## 2018-10-11 VITALS — BP 140/90 | HR 90 | Ht 63.0 in | Wt 104.0 lb

## 2018-10-11 DIAGNOSIS — Z3044 Encounter for surveillance of vaginal ring hormonal contraceptive device: Secondary | ICD-10-CM

## 2018-10-11 DIAGNOSIS — Z124 Encounter for screening for malignant neoplasm of cervix: Secondary | ICD-10-CM

## 2018-10-11 DIAGNOSIS — Z01419 Encounter for gynecological examination (general) (routine) without abnormal findings: Secondary | ICD-10-CM | POA: Diagnosis not present

## 2018-10-11 MED ORDER — ETONOGESTREL-ETHINYL ESTRADIOL 0.12-0.015 MG/24HR VA RING: VAGINAL_RING | VAGINAL | 3 refills | Status: DC

## 2018-10-11 NOTE — Progress Notes (Signed)
Gynecology Annual Exam  PCP: Margo Common, [place]  Chief Complaint:  Chief Complaint  Patient presents with  . Gynecologic Exam    History of Present Illness:Debra Garza is a 38 year old Caucasian/White married female ,G 1 P 0 0 1 0, who presents for her annual exam . She has not had any gynecological problems.  Her menses are regular and her LMP was 09/19/18 . They occur every 28 days , they last 5 days , are medium flow, and are without clots.  Sometimes she manipulates her menses by using the Nuvaring continuously x 2 rings  She has had no spotting.  She reports dysmenorrhea with some menses.  The patient's past medical history is notable for a history of ADD, depression, asthma, anxiety, migraines.  Since her last annual GYN exam dated 10/04/17, she has had several health care problems including spraining her right ankle, strabismus surgery 12/19, and treatment for an UTI last month. Her husband has been diagnosed with diabetes and they are selling their current home and building a home. 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 She is having more problems with depression/ anxiety and was begun on Prozac, NAL amino acids (helps with OCD), and inderal. With all this happening she has put her plans for conceiving on hold.  Her most recent pap smear was obtained 10/04/2017 and was NIL. Mammogram is not applicable.  There is a family history of breast cancer in her mother. Her mother was just diagnosed with breast cancer this past year at the age of 2.  There is no family history of ovarian cancer.  The patient does do self breast exams. The patient does not smoke.  The patient does drink 1- 2 beer/week..  The patient does not use illegal drugs.  The patient has just joined [employer] recently The patient does get adequate calcium in her diet.  She had a recent cholesterol screen at work in 2016 that was normal.      Review of Systems: Review  of Systems  Constitutional: Negative for chills, fever and weight loss.  HENT: Negative for congestion, sinus pain and sore throat.   Eyes: Negative for blurred vision, double vision and pain.  Respiratory: Negative for hemoptysis, shortness of breath and wheezing.   Cardiovascular: Negative for chest pain, palpitations and leg swelling.  Gastrointestinal: Negative for abdominal pain, blood in stool, diarrhea, heartburn, nausea and vomiting.  Genitourinary: Negative for dysuria, frequency, hematuria and urgency.  Musculoskeletal: Negative for back pain, joint pain and myalgias.  Skin: Negative for itching and rash.  Neurological: Negative for dizziness, tingling and headaches.  Endo/Heme/Allergies: Negative for environmental allergies and polydipsia. Does not bruise/bleed easily.       Negative for hirsutism   Psychiatric/Behavioral: Positive for depression. The patient is nervous/anxious. The patient does not have insomnia.     Past Medical History:  Past Medical History:  Diagnosis Date  . ADD (attention deficit disorder)   . Allergic rhinitis   . Anxiety   . Asthma   . Depression    DR. KAUR  . Insomnia   . Migraine   . Other abnormal glucose    SEASONAL ALLERGIES  . Panic attacks   . Strabismic amblyopia of right eye     Past Surgical History:  Past Surgical History:  Procedure Laterality Date  . STRABISMUS SURGERY Bilateral 01/04/2018    Family History:  Family History  Problem Relation Age of  Onset  . Healthy Mother   . Cancer Mother        BASEL CELL 40  . Breast cancer Mother 24       DCIS  . Healthy Father   . Hyperlipidemia Father   . Cancer Father        BASEL CELL 40  . ADD / ADHD Father   . Healthy Sister   . Depression Sister   . Liver cancer Maternal Grandmother   . Cancer Maternal Grandmother        COLON  . Schizophrenia Maternal Grandmother   . Dementia Maternal Grandfather     Social History:  Social History   Socioeconomic History   . Marital status: Married    Spouse name: [name]  . Number of children: 0  . Years of education: 46  . Highest education level: Not on file  Occupational History  . Occupation: NURSE  Social Needs  . Financial resource strain: Not on file  . Food insecurity    Worry: Not on file    Inability: Not on file  . Transportation needs    Medical: Not on file    Non-medical: Not on file  Tobacco Use  . Smoking status: Never Smoker  . Smokeless tobacco: Never Used  Substance and Sexual Activity  . Alcohol use: Yes    Alcohol/week: 1.0 - 2.0 standard drinks    Types: 1 - 2 Cans of beer per week    Comment: occasionally  . Drug use: No  . Sexual activity: Yes    Partners: Male    Birth control/protection: Other-see comments    Comment: Nuvaring  Lifestyle  . Physical activity    Days per week: Not on file    Minutes per session: Not on file  . Stress: Not on file  Relationships  . Social Musician on phone: Not on file    Gets together: Not on file    Attends religious service: Not on file    Active member of club or organization: Not on file    Attends meetings of clubs or organizations: Not on file    Relationship status: Not on file  . Intimate partner violence    Fear of current or ex partner: Not on file    Emotionally abused: Not on file    Physically abused: Not on file    Forced sexual activity: Not on file  Other Topics Concern  . Not on file  Social History Narrative  . Not on file    Allergies:  Allergies  Allergen Reactions  . Banana Anaphylaxis  . Other Anaphylaxis    Chickpeas  . Augmentin [Amoxicillin-Pot Clavulanate] Hives  . Codeine     Other reaction(s): Vomiting  . Hydrocodone Other (See Comments)  . Penicillins Hives  . Sudafed [Pseudoephedrine Hcl]   . Tape Rash    Medications:  Current Outpatient Medications on File Prior to Visit  Medication Sig Dispense Refill  . albuterol (VENTOLIN HFA) 108 (90 Base) MCG/ACT inhaler Inhale  2 puffs into the lungs as needed.    Marland Kitchen buPROPion (WELLBUTRIN XL) 150 MG 24 hr tablet Take 1 tablet (150 mg total) by mouth daily. Take one tablet by mouth each morning. 90 tablet 1  . EPINEPHrine 0.3 mg/0.3 mL IJ SOAJ injection Inject 0.3 mLs (0.3 mg total) into the muscle once. 1 Device 0  . FLUoxetine (PROZAC) 10 MG capsule Take 1 capsule (10 mg total) by mouth daily. 90  capsule 0  . fluticasone (FLONASE) 50 MCG/ACT nasal spray Place into both nostrils daily.    . Methylphenidate HCl ER, PM, (JORNAY PM) 60 MG CP24 Take 60 mg by mouth every evening. 30 capsule 0  . nitrofurantoin, macrocrystal-monohydrate, (MACROBID) 100 MG capsule Take one BID x 7 days the take one after intercourse prn. 30 capsule 3  . Prenatal Vit-Fe Fumarate-FA (PRENATAL VITAMINS) 28-0.8 MG TABS Take by mouth.    . propranolol (INDERAL) 10 MG tablet Take 1-2 tabs po BID prn anxiety 120 tablet 1  . rizatriptan (MAXALT) 10 MG tablet Take 1 tablet (10 mg total) by mouth as needed for migraine. May repeat in 2 hours if needed 10 tablet 0  . traZODone (DESYREL) 50 MG tablet Take 1-2 tabs po QHS prn insomnia 180 tablet 1   No current facility-administered medications on file prior to visit.         Physical Exam Vitals: BP 140/90   Pulse 90   Ht 5\' 3"  (1.6 m)   Wt 104 lb (47.2 kg)   LMP 09/19/2018 (Approximate)   BMI 18.42 kg/m  General: WF in NAD HEENT: normocephalic, anicteric Neck: no thyroid enlargement, no palpable nodules, no cervical lymphadenopathy  Pulmonary: No increased work of breathing, CTAB Cardiovascular: RRR, without murmur  Breast: Breast symmetrical, no tenderness, no palpable nodules or masses, no skin or nipple retraction present, no nipple discharge. Nipples pierced bilaterally.  No axillary, infraclavicular or supraclavicular lymphadenopathy. Abdomen: Soft, non-tender, non-distended.  Umbilicus pierced.  No hepatomegaly or masses palpable. No evidence of hernia. Genitourinary:  External: Normal  external female genitalia.  Normal urethral meatus, normal Bartholin's and Skene's glands.    Vagina: Normal vaginal mucosa, no evidence of prolapse, Nuvaring in place.    Cervix: Grossly normal in appearance, friable endocervix with Pap,, non-tender  Uterus: Anteflexed, normal size, shape, and consistency, mobile, and non-tender  Adnexa: No adnexal masses, non-tender  Rectal: deferred  Lymphatic: no evidence of inguinal lymphadenopathy Extremities: no edema, erythema, or tenderness Neurologic: Grossly intact Psychiatric: mood appropriate, affect full     Assessment: 38 y.o. G1P0010 annual gyn exam  Plan:    1) Breast cancer screening - recommend monthly self breast exam.   2) Cervical cancer screening - Pap was done. ASCCP guidelines and rational discussed.  Patient opts for yearly screening interval  3) Contraception - Refill Nuvaring x 1 year  4) Routine healthcare maintenance including cholesterol and diabetes screening UTD -due next year  5) RTO 1 year and prn  Farrel Connersolleen [place], CNM   Farrel Connersolleen Corby [place], [place]

## 2018-10-16 LAB — CYTOLOGY - PAP: Diagnosis: NEGATIVE

## 2018-10-18 ENCOUNTER — Other Ambulatory Visit: Payer: Self-pay

## 2018-10-18 ENCOUNTER — Ambulatory Visit (INDEPENDENT_AMBULATORY_CARE_PROVIDER_SITE_OTHER): Payer: 59 | Admitting: Mental Health

## 2018-10-18 DIAGNOSIS — F902 Attention-deficit hyperactivity disorder, combined type: Secondary | ICD-10-CM | POA: Diagnosis not present

## 2018-10-18 NOTE — Progress Notes (Signed)
[facility] Psychotherapy Note  Name: DEISI SALONGA Date: 10/18/18 MRN: [id number] DOB: October 02, 1980 PCP: Margo Common, [place]  Time spent: 55 minutes  Treatment: Individual therapy  Mental Status Exam:   Appearance:   Casual     Behavior:  Appropriate  Motor:  Normal  Speech/Language:   Clear and Coherent  Affect:  constricted  Mood:  anxious and depressed  Thought process:  normal  Thought content:    WNL  Sensory/Perceptual disturbances:    WNL  Orientation:  oriented to person, place, time/date and situation  Attention:  Good  Concentration:  Good  Memory:  WNL  Fund of knowledge:   Good  Insight:    Good  Judgment:   Good  Impulse Control:  Good   Reported Symptoms: Distractible, difficulty focusing/problems with concentration, problems with organization, anxiety, forgetful  Risk Assessment: Danger to Self:  No Self-injurious Behavior: No Danger to Others: No Duty to Warn:no Physical Aggression / Violence:No  Access to Firearms a concern: No  Gang Involvement:No  Patient / guardian was educated about steps to take if suicide or homicide risk level increases between visits: yes While future psychiatric events cannot be accurately predicted, the patient does not currently require acute inpatient psychiatric care and does not currently meet [facility] involuntary commitment criteria.  Subjective: Patient arrived on time for today's session.  Discussed progress, recent events.  She shared what he experiences as she has started a new job.  She stated that she will work first shift and will have to adjust to early mornings.  We discussed ways to increase organization, keeping a daily calendar with specific timeframes for her nighttime routine to get enough sleep and also to arrive to work on time as well as other areas.  Patient shared how this job she feels is going to be a good fit for her as it is a job of repetitive behaviors that will reinforce  consistency which she feels helps given her distractibility.  Discussed her marital relationship in which there has been some stress recently.  She stated that they have had some improved communication and problem-solving where they are able to tackle daily tasks and home projects with lists and she has found this helpful.  We discussed less prioritization with integrated timeframes for tasks to increase consistency and decreasing procrastination.  Some ways to communicate in the relationship, to clearly express her feelings and needs were explored.    Interventions: CBT, supportive therapy, problem sovling  Diagnoses:    ICD-10-CM   1. Attention deficit hyperactivity disorder (ADHD), combined type  F90.2     Plan of Care:   1.  Patient to continue to engage in individual counseling 2-4 times a month or as needed. 2.  Patient to identify and apply CBT, coping skills learned in session to decrease symptoms, improving organization / consistency w/ daily routine / scheduling. 3.  Patient to continue medication management services and report any concerns as needed. 4.  Patient to contact this office, go to the local ED or call 911 if a crisis or emergency develops between visits.   Anson [place], [facility]

## 2018-11-01 ENCOUNTER — Ambulatory Visit (INDEPENDENT_AMBULATORY_CARE_PROVIDER_SITE_OTHER): Payer: 59 | Admitting: Mental Health

## 2018-11-01 ENCOUNTER — Other Ambulatory Visit: Payer: Self-pay

## 2018-11-01 DIAGNOSIS — F902 Attention-deficit hyperactivity disorder, combined type: Secondary | ICD-10-CM

## 2018-11-01 NOTE — Progress Notes (Signed)
[facility] Psychotherapy Note  Name: SADAF PRZYBYSZ Date: 11/01/18 MRN: [id number] DOB: 09/05/1980 PCP: Margo Common, [place]  Time spent: 55 minutes  Treatment: Individual therapy  Mental Status Exam:   Appearance:   Casual     Behavior:  Appropriate  Motor:  Normal  Speech/Language:   Clear and Coherent  Affect:  constricted  Mood:  anxious and depressed  Thought process:  normal  Thought content:    WNL  Sensory/Perceptual disturbances:    WNL  Orientation:  oriented to person, place, time/date and situation  Attention:  Good  Concentration:  Good  Memory:  WNL  Fund of knowledge:   Good  Insight:    Good  Judgment:   Good  Impulse Control:  Good   Reported Symptoms: Distractible, difficulty focusing/problems with concentration, problems with organization, anxiety, forgetful  Risk Assessment: Danger to Self:  No Self-injurious Behavior: No Danger to Others: No Duty to Warn:no Physical Aggression / Violence:No  Access to Firearms a concern: No  Gang Involvement:No  Patient / guardian was educated about steps to take if suicide or homicide risk level increases between visits: yes While future psychiatric events cannot be accurately predicted, the patient does not currently require acute inpatient psychiatric care and does not currently meet [facility] involuntary commitment criteria.  Subjective: Patient arrived on time for today's session.  Discussed progress, recent events.  She stated that her husband was in a car accident earlier this morning but he is okay.  She shared how she continues to adjust well to her new job, has been able to stay consistent with her routine in arriving to work on time and learning work processes.  She shared how she feels this job will be helpful as some of the functions of the job stay consistent daily; she feels this will keep her more consistently on task.  She went on to process some stress in her marital relationship.   She shared how she has a history of struggling financially but this is improved over the last several years.  She shared how she wants to be more assertive with some communication with her husband in these areas.  Ways to follow through on some communication strategies were discussed.  Patient denied any significant procrastination which would affect her scheduling and being consistent with work.  Patient is making progress in this area.  Interventions: CBT, supportive therapy, problem sovling  Diagnoses:    ICD-10-CM   1. Attention deficit hyperactivity disorder (ADHD), combined type  F90.2     Plan of Care:   1.  Patient to continue to engage in individual counseling 2-4 times a month or as needed. 2.  Patient to identify and apply CBT, coping skills learned in session to decrease symptoms, improving organization / consistency w/ daily routine / scheduling. 3.  Patient to continue medication management services and report any concerns as needed. 4.  Patient to contact this office, go to the local ED or call 911 if a crisis or emergency develops between visits.   Anson [place], [facility]

## 2018-11-06 ENCOUNTER — Ambulatory Visit: Payer: 59 | Admitting: Psychiatry

## 2018-11-17 ENCOUNTER — Ambulatory Visit: Payer: Self-pay | Admitting: Mental Health

## 2018-11-29 ENCOUNTER — Ambulatory Visit (INDEPENDENT_AMBULATORY_CARE_PROVIDER_SITE_OTHER): Payer: 59 | Admitting: Psychiatry

## 2018-11-29 ENCOUNTER — Encounter: Payer: Self-pay | Admitting: Psychiatry

## 2018-11-29 ENCOUNTER — Other Ambulatory Visit: Payer: Self-pay

## 2018-11-29 DIAGNOSIS — F330 Major depressive disorder, recurrent, mild: Secondary | ICD-10-CM | POA: Diagnosis not present

## 2018-11-29 DIAGNOSIS — F902 Attention-deficit hyperactivity disorder, combined type: Secondary | ICD-10-CM

## 2018-11-29 DIAGNOSIS — F419 Anxiety disorder, unspecified: Secondary | ICD-10-CM | POA: Diagnosis not present

## 2018-11-29 DIAGNOSIS — F5101 Primary insomnia: Secondary | ICD-10-CM

## 2018-11-29 MED ORDER — TRAZODONE HCL 50 MG PO TABS: ORAL_TABLET | ORAL | 1 refills | Status: DC

## 2018-11-29 MED ORDER — JORNAY PM 60 MG PO CP24: 60.0000 mg | ORAL_CAPSULE | Freq: Every evening | ORAL | 0 refills | Status: DC

## 2018-11-29 MED ORDER — BUPROPION HCL ER (XL) 150 MG PO TB24: 150.0000 mg | ORAL_TABLET | Freq: Every day | ORAL | 1 refills | Status: DC

## 2018-11-29 MED ORDER — FLUOXETINE HCL 10 MG PO CAPS: 10.0000 mg | ORAL_CAPSULE | Freq: Every day | ORAL | 1 refills | Status: DC

## 2018-11-29 MED ORDER — PROPRANOLOL HCL 10 MG PO TABS: ORAL_TABLET | ORAL | 1 refills | Status: DC

## 2018-11-29 NOTE — Progress Notes (Signed)
Debra Garza [id number] November 20, 1980 38 y.o.  Subjective:   Patient ID:  Debra Garza is a 38 y.o. (DOB November 20, 1980) female.  Chief Complaint:  Chief Complaint  Patient presents with  . Follow-up    ADD, Anxiety, and Depression    HPI Debra Garza presents to the office today for follow-up of ADD, Anxiety, and Depression. She reports that she had 2-week period without any medication due to lapse in insurance with job change. She reports that Ophelia CharterJornay has been effective for ADD and reports that it is effective upon awakening and has been helpful with getting up and ready for work.   Reports that she has had less appetite suppression with Ophelia CharterJornay and is eating more often. Anxiety has been "up and down." Some situational anxiety at times with increased heart rate. Reports that anxiety is manageable. Mood has been "alright" despite situational stressors. She reports some difficulty falling asleep with going to bed earlier. Energy and motivation have been adequate. Denies SI.   She reports that NAC has been helpful for skin picking.   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 Has been waking up at 3 am for training. Reports that she will likely need to arrive at new job at 4:30 am. Debra Garza their house and in the process of looking for a new house.   Past medication trials: Vyvanse-effective Dexedrine-effective Adderall- mood swings, tearfulness and irritability Methylphenidate-effective for only brief periods of time Concerta-increased blood pressure Strattera-increased blood pressure. Ineffective.  Clonidine- Severe drowsiness Mirtazapine-low energy Wellbutrin XL-helpful for mood at 150 mg daily. Had increased anxiety at 300 mg dose. Trintellix-Has been helpful for mood and anxiety. Prozac   Review of Systems:  Review of Systems  Gastrointestinal: Positive for abdominal pain.  Musculoskeletal: Positive for  back pain. Negative for gait problem.  Neurological: Negative for tremors.  Psychiatric/Behavioral:       Please refer to HPI  Had some nausea yesterday.  Medications: I have reviewed the patient's current medications.  Current Outpatient Medications  Medication Sig Dispense Refill  . albuterol (VENTOLIN HFA) 108 (90 Base) MCG/ACT inhaler Inhale 2 puffs into the lungs as needed.    Marland Kitchen. buPROPion (WELLBUTRIN XL) 150 MG 24 hr tablet Take 1 tablet (150 mg total) by mouth daily. Take one tablet by mouth each morning. 90 tablet 1  . EPINEPHrine 0.3 mg/0.3 mL IJ SOAJ injection Inject 0.3 mLs (0.3 mg total) into the muscle once. 1 Device 0  . etonogestrel-ethinyl estradiol (NUVARING) 0.12-0.015 MG/24HR vaginal ring Insert vaginally and leave in place for 3 consecutive weeks, then remove for 1 week. 3 each 3  . FLUoxetine (PROZAC) 10 MG capsule Take 1 capsule (10 mg total) by mouth daily. 90 capsule 1  . fluticasone (FLONASE) 50 MCG/ACT nasal spray Place into both nostrils daily.    . Methylphenidate HCl ER, PM, (JORNAY PM) 60 MG CP24 Take 60 mg by mouth every evening. 30 capsule 0  . [START ON 12/27/2018] Methylphenidate HCl ER, PM, (JORNAY PM) 60 MG CP24 Take 60 mg by mouth every evening. 30 capsule 0  . [START ON 01/24/2019] Methylphenidate HCl ER, PM, (JORNAY PM) 60 MG CP24 Take 60 mg by mouth every evening. 30 capsule 0  . nitrofurantoin, macrocrystal-monohydrate, (MACROBID) 100 MG capsule Take one BID x 7 days the take one after intercourse prn. 30 capsule 3  . Prenatal Vit-Fe Fumarate-FA (PRENATAL VITAMINS) 28-0.8 MG TABS Take by mouth.    .Marland Kitchen  propranolol (INDERAL) 10 MG tablet Take 1-2 tabs po BID prn anxiety 120 tablet 1  . rizatriptan (MAXALT) 10 MG tablet Take 1 tablet (10 mg total) by mouth as needed for migraine. May repeat in 2 hours if needed 10 tablet 0  . traZODone (DESYREL) 50 MG tablet Take 1-2 tabs po QHS prn insomnia 180 tablet 1   No current facility-administered medications for  this visit.     Medication Side Effects: None  Allergies:  Allergies  Allergen Reactions  . Banana Anaphylaxis  . Other Anaphylaxis    Chickpeas  . Augmentin [Amoxicillin-Pot Clavulanate] Hives  . Codeine     Other reaction(s): Vomiting  . Hydrocodone Other (See Comments)  . Penicillins Hives  . Sudafed [Pseudoephedrine Hcl]   . Tape Rash    Past Medical History:  Diagnosis Date  . ADD (attention deficit disorder)   . Allergic rhinitis   . Anxiety   . Asthma   . Depression    DR. KAUR  . Insomnia   . Migraine   . Other abnormal glucose    SEASONAL ALLERGIES  . Panic attacks   . Strabismic amblyopia of right eye     Family History  Problem Relation Age of Onset  . Healthy Mother   . Cancer Mother        BASEL CELL 40  . Breast cancer Mother 51       DCIS  . Healthy Father   . Hyperlipidemia Father   . Cancer Father        BASEL CELL 40  . ADD / ADHD Father   . Healthy Sister   . Depression Sister   . Liver cancer Maternal Grandmother   . Cancer Maternal Grandmother        COLON  . Schizophrenia Maternal Grandmother   . Dementia Maternal Grandfather     Social History   Socioeconomic History  . Marital status: Married    Spouse name: Debra Garza  . Number of children: 0  . Years of education: 38  . Highest education level: Not on file  Occupational History  . Occupation: NURSE  Social Needs  . Financial resource strain: Not on file  . Food insecurity    Worry: Not on file    Inability: Not on file  . Transportation needs    Medical: Not on file    Non-medical: Not on file  Tobacco Use  . Smoking status: Never Smoker  . Smokeless tobacco: Never Used  Substance and Sexual Activity  . Alcohol use: Yes    Alcohol/week: 1.0 - 2.0 standard drinks    Types: 1 - 2 Cans of beer per week    Comment: occasionally  . Drug use: No  . Sexual activity: Yes    Partners: Male    Birth control/protection: Other-see comments    Comment: Nuvaring  Lifestyle   . Physical activity    Days per week: Not on file    Minutes per session: Not on file  . Stress: Not on file  Relationships  . Social Musician on phone: Not on file    Gets together: Not on file    Attends religious service: Not on file    Active member of club or organization: Not on file    Attends meetings of clubs or organizations: Not on file    Relationship status: Not on file  . Intimate partner violence    Fear of current or ex partner: Not  on file    Emotionally abused: Not on file    Physically abused: Not on file    Forced sexual activity: Not on file  Other Topics Concern  . Not on file  Social History Narrative  . Not on file    Past Medical History, Surgical history, Social history, and Family history were reviewed and updated as appropriate.   Please see review of systems for further details on the patient's review from today.   Objective:   Physical Exam:  BP (!) 120/94   Pulse 86   Physical Exam Constitutional:      General: She is not in acute distress.    Appearance: She is well-developed.  Musculoskeletal:        General: No deformity.  Neurological:     Mental Status: She is alert and oriented to person, place, and time.     Coordination: Coordination normal.  Psychiatric:        Attention and Perception: Attention and perception normal. She does not perceive auditory or visual hallucinations.        Mood and Affect: Mood normal. Mood is not anxious or depressed. Affect is not labile, blunt, angry or inappropriate.        Speech: Speech normal.        Behavior: Behavior normal.        Thought Content: Thought content normal. Thought content does not include homicidal or suicidal ideation. Thought content does not include homicidal or suicidal plan.        Cognition and Memory: Cognition and memory normal.        Judgment: Judgment normal.     Comments: Insight intact. No delusions.      Lab Review:     Component Value  Date/Time   NA 134 (L) 10/23/2015 0146   K 3.3 (L) 10/23/2015 0146   CL 103 10/23/2015 0146   CO2 23 10/23/2015 0146   GLUCOSE 125 (H) 10/23/2015 0146   BUN 11 10/23/2015 0146   CREATININE 0.90 10/23/2015 0146   CALCIUM 9.4 10/23/2015 0146   GFRNONAA >60 10/23/2015 0146   GFRAA >60 10/23/2015 0146       Component Value Date/Time   WBC 7.1 04/05/2017 1206   WBC 9.2 10/23/2015 0146   RBC 4.87 04/05/2017 1206   RBC 5.42 (H) 10/23/2015 0146   HGB 14.3 04/05/2017 1206   HCT 43.0 04/05/2017 1206   PLT 291 04/05/2017 1206   MCV 88 04/05/2017 1206   MCH 29.4 04/05/2017 1206   MCH 29.6 10/23/2015 0146   MCHC 33.3 04/05/2017 1206   MCHC 34.9 10/23/2015 0146   RDW 12.8 04/05/2017 1206   LYMPHSABS 2.9 04/05/2017 1206   EOSABS 0.3 04/05/2017 1206   BASOSABS 0.0 04/05/2017 1206    No results found for: POCLITH, LITHIUM   No results found for: PHENYTOIN, PHENOBARB, VALPROATE, CBMZ   .res Assessment: Plan:   Discussed re-starting medications now that pt's new insurance has become effective. Recommended not re-starting Jornay until she confirms that she is not pregnant since pt reports that she was not able to refill birth control with insurance lapse and has noticed some slight physical changes. Recommended not re-starting [place] Alar if she is pregnant or becomes pregnant. Will restart Wellbutrin XL, Prozac, Trazodone, and Propranolol prn.  Recommend continuing psychotherapy with Lanetta Inch, LPC. Pt to f/u in 3 months or sooner if clinically indicated.  Patient advised to contact office with any questions, adverse effects, or acute worsening in signs and  symptoms.  Debra Garza was seen today for follow-up.  Diagnoses and all orders for this visit:  Mild episode of recurrent major depressive disorder (HCC) -     buPROPion (WELLBUTRIN XL) 150 MG 24 hr tablet; Take 1 tablet (150 mg total) by mouth daily. Take one tablet by mouth each morning. -     FLUoxetine (PROZAC) 10 MG capsule; Take  1 capsule (10 mg total) by mouth daily.  Anxiety -     FLUoxetine (PROZAC) 10 MG capsule; Take 1 capsule (10 mg total) by mouth daily. -     propranolol (INDERAL) 10 MG tablet; Take 1-2 tabs po BID prn anxiety  Primary insomnia -     traZODone (DESYREL) 50 MG tablet; Take 1-2 tabs po QHS prn insomnia  Attention deficit hyperactivity disorder (ADHD), combined type -     Methylphenidate HCl ER, PM, (JORNAY PM) 60 MG CP24; Take 60 mg by mouth every evening. -     Methylphenidate HCl ER, PM, (JORNAY PM) 60 MG CP24; Take 60 mg by mouth every evening. -     Methylphenidate HCl ER, PM, (JORNAY PM) 60 MG CP24; Take 60 mg by mouth every evening.     Please see After Visit Summary for patient specific instructions.  Future Appointments  Date Time Provider Department Center  12/08/2018  8:00 AM Waldron Session, [facility] None  03/01/2019  8:30 AM Corie Chiquito, PMHNP CP-CP None    No orders of the defined types were placed in this encounter.   -------------------------------

## 2018-12-01 ENCOUNTER — Telehealth: Payer: Self-pay

## 2018-12-01 NOTE — Telephone Encounter (Signed)
Prior authorization submitted and approved for Jornay PM 60 mg capsules, PA Case: [id number], Status: Approved, Coverage Starts on: 12/01/2018 12:00:00 AM, Coverage Ends on: 11/30/2019 12:00:00 AM Through Christella Scheuermann

## 2018-12-08 ENCOUNTER — Ambulatory Visit (INDEPENDENT_AMBULATORY_CARE_PROVIDER_SITE_OTHER): Payer: 59 | Admitting: Mental Health

## 2018-12-08 ENCOUNTER — Other Ambulatory Visit: Payer: Self-pay

## 2018-12-08 DIAGNOSIS — F330 Major depressive disorder, recurrent, mild: Secondary | ICD-10-CM | POA: Diagnosis not present

## 2018-12-08 DIAGNOSIS — F902 Attention-deficit hyperactivity disorder, combined type: Secondary | ICD-10-CM | POA: Diagnosis not present

## 2018-12-08 NOTE — Progress Notes (Signed)
[facility] Psychotherapy Note  Name: Debra Garza Date: 12/08/18 MRN: [id number] DOB: 01/31/1980 PCP: Margo Common, PA  Time spent: 56 minutes  Treatment: Individual therapy  Mental Status Exam:   Appearance:   Casual     Behavior:  Appropriate  Motor:  Normal  Speech/Language:   Clear and Coherent  Affect:  Full range  Mood:  euthymic  Thought process:  normal  Thought content:    WNL  Sensory/Perceptual disturbances:    WNL  Orientation:  oriented to person, place, time/date and situation  Attention:  Good  Concentration:  Good  Memory:  WNL  Fund of knowledge:   Good  Insight:    Good  Judgment:   Good  Impulse Control:  Good   Reported Symptoms: Distractible, difficulty focusing/problems with concentration, problems with organization, anxiety, forgetful, sad thoughts, negative self talk  Risk Assessment: Danger to Self:  No Self-injurious Behavior: No Danger to Others: No Duty to Warn:no Physical Aggression / Violence:No  Access to Firearms a concern: No  Gang Involvement:No  Patient / guardian was educated about steps to take if suicide or homicide risk level increases between visits: yes While future psychiatric events cannot be accurately predicted, the patient does not currently require acute inpatient psychiatric care and does not currently meet [facility] involuntary commitment criteria.  Subjective: Patient arrived on time for today's session. 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  Explored her follow-through on being consistent with her daily schedule, waking up in arriving to work on time, getting enough sleep etc.  She stated that she wakes every morning around 3 AM and this allows her to arrive at work early to get ready for her day and she has found this to be helpful in reducing stress and anxiety.  She also has followed through making her daily work tasks list to keep herself on track, organized.  She shared  the continued stress in her marriage related to how her husband may make some judgmental statements around her getting tasks done around the house consistently.  She stated that he struggles understand ADHD fully and how she tries to cope with symptoms.  She is off her medication currently due to an insurance change but plans to get back on her but plans to get back on when possible. They are considering starting a family and have bought a new home to close next month. This will reduce they stress as they will have a lot more room. Pt continues to de-clutter, has made progress on item purging. She continues to have some residual sad/hurt feelings from past co-worker experiences from her last job. Discussed STOPP skill to continue to utilize between sessions.  Interventions: CBT, supportive therapy, problem sovling  Diagnoses:    ICD-10-CM   1. Mild episode of recurrent major depressive disorder ([place])  F33.0   2. Attention deficit hyperactivity disorder (ADHD), combined type  F90.2    Plan: Patient is to use CBT, mindfulness and coping skills to help manage decrease symptoms associated with their diagnosis.  Patient to follow through with adhering to having a consistent routine by getting enough sleep and being prompt with arriving to work on time and staying organized.  patient to utilize STOPP strategy.   Long-term goal:   Reduce overall level, frequency, and intensity of the feelings of depression, anxiety evidenced by decreased negative self talk, and helpless feelings from 6 to 7 days/week to 0 to 1 days/week per client report for  at least 3 consecutive months.  Short-term goal:  Patient to increase consistency with sleep schedule getting at least 6 to 8 hours per night Patient to follow through utilizing coping skills as discussed. Patient to maintain her daily calendar and task lists to increase consistency with arriving to work on time and completing all tasks Patient to maintain consistent  medication regimen and report any concerns to prescribing provider.  Assessment of progress:  progressing    Waldron Session, [facility]

## 2018-12-13 ENCOUNTER — Other Ambulatory Visit: Payer: Self-pay | Admitting: Certified Nurse Midwife

## 2018-12-13 MED ORDER — NITROFURANTOIN MONOHYD MACRO 100 MG PO CAPS: ORAL_CAPSULE | ORAL | 3 refills | Status: DC

## 2019-01-02 ENCOUNTER — Other Ambulatory Visit: Payer: Self-pay

## 2019-01-02 ENCOUNTER — Ambulatory Visit (INDEPENDENT_AMBULATORY_CARE_PROVIDER_SITE_OTHER): Payer: 59 | Admitting: Mental Health

## 2019-01-02 DIAGNOSIS — F330 Major depressive disorder, recurrent, mild: Secondary | ICD-10-CM | POA: Diagnosis not present
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
## 2019-01-15 ENCOUNTER — Encounter: Payer: Self-pay | Admitting: Family Medicine

## 2019-01-22 ENCOUNTER — Other Ambulatory Visit: Payer: Self-pay

## 2019-01-22 ENCOUNTER — Telehealth: Payer: Self-pay | Admitting: Psychiatry

## 2019-01-22 ENCOUNTER — Other Ambulatory Visit: Payer: Self-pay | Admitting: Psychiatry

## 2019-01-22 DIAGNOSIS — F902 Attention-deficit hyperactivity disorder, combined type: Secondary | ICD-10-CM

## 2019-01-22 DIAGNOSIS — F330 Major depressive disorder, recurrent, mild: Secondary | ICD-10-CM

## 2019-01-22 DIAGNOSIS — F419 Anxiety disorder, unspecified: Secondary | ICD-10-CM

## 2019-01-22 DIAGNOSIS — F5101 Primary insomnia: Secondary | ICD-10-CM

## 2019-01-22 MED ORDER — FLUOXETINE HCL 10 MG PO CAPS: 10.0000 mg | ORAL_CAPSULE | Freq: Every day | ORAL | 1 refills | Status: DC

## 2019-01-22 MED ORDER — BUPROPION HCL ER (XL) 150 MG PO TB24: 150.0000 mg | ORAL_TABLET | Freq: Every day | ORAL | 1 refills | Status: DC

## 2019-01-22 MED ORDER — TRAZODONE HCL 50 MG PO TABS: ORAL_TABLET | ORAL | 1 refills | Status: DC

## 2019-01-22 MED ORDER — JORNAY PM 60 MG PO CP24: 60.0000 mg | ORAL_CAPSULE | Freq: Every evening | ORAL | 0 refills | Status: DC

## 2019-01-22 NOTE — Telephone Encounter (Signed)
90 day [place] sent

## 2019-01-22 NOTE — Telephone Encounter (Signed)
[place] called because her insurance required her to switch pharmacies.  She has to use [facility] [place] and they want all maintenance medications to be 90 day.  So she needs her Jornay prescription switched from the [facility] in [place] to the [facility] and if a 90 day is allowed for the medication please do a 90 day.  Next appt 03/01/19

## 2019-01-22 NOTE — Telephone Encounter (Signed)
Updated pharmacy to [facility] in [place] per insurance for 90 day supply

## 2019-01-31 ENCOUNTER — Ambulatory Visit (INDEPENDENT_AMBULATORY_CARE_PROVIDER_SITE_OTHER): Payer: 59 | Admitting: Mental Health

## 2019-01-31 ENCOUNTER — Other Ambulatory Visit: Payer: Self-pay

## 2019-01-31 DIAGNOSIS — F902 Attention-deficit hyperactivity disorder, combined type: Secondary | ICD-10-CM | POA: Diagnosis not present

## 2019-01-31 DIAGNOSIS — F330 Major depressive disorder, recurrent, mild: Secondary | ICD-10-CM

## 2019-01-31 NOTE — Progress Notes (Signed)
[facility] Psychotherapy Note  Name: Debra Garza Date: 01/31/19 MRN: [id number] DOB: May 12, 1980 PCP: Tamsen Roers, PA  Time spent: 50 minutes  Treatment: Individual therapy  Mental Status Exam:   Appearance:   Casual     Behavior:  Appropriate  Motor:  Normal  Speech/Language:   Clear and Coherent  Affect:  Full range  Mood:  euthymic  Thought process:  normal  Thought content:    WNL  Sensory/Perceptual disturbances:    WNL  Orientation:  oriented to person, place, time/date and situation  Attention:  Good  Concentration:  Good  Memory:  WNL  Fund of knowledge:   Good  Insight:    Good  Judgment:   Good  Impulse Control:  Good   Reported Symptoms: Distractible, difficulty focusing/problems with concentration, problems with organization, anxiety, forgetful, sad thoughts, negative self talk  Risk Assessment: Danger to Self:  No Self-injurious Behavior: No Danger to Others: No Duty to Warn:no Physical Aggression / Violence:No  Access to Firearms a concern: No  Gang Involvement:No  Patient / guardian was educated about steps to take if suicide or homicide risk level increases between visits: yes While future psychiatric events cannot be accurately predicted, the patient does not currently require acute inpatient psychiatric care and does not currently meet [facility] involuntary commitment criteria.  Subjective:  Patient arrived on time for today's session.  She shared how she is stressed today as she got a traffic ticket for speeding on the way to the appointment.  She stated that she is concerned as she knows she and her husband will have more stress in the relationship as a result.  She went on to discuss how he talks to her when upset, says hurtful things.  She shared feelings, the struggle of coping with not taking care of household tasks as he would like her to, she shared how she tries and does test differently than he would and how he is  often critical of the difference.  Ways to communicate in the relationship were discussed.  She stated that he made some recent comments about being unhappy in the relationship.  This is hopeful to patient and identified feelings of shame and guilt.  Patient was encouraged to further discuss their possibly engaging in marital counseling as this is been discussed in previous sessions.  She plans to follow through between sessions.  Interventions: CBT, supportive therapy, problem sovling  Diagnoses:    ICD-10-CM   1. Attention deficit hyperactivity disorder (ADHD), combined type  F90.2   2. Mild episode of recurrent major depressive disorder (HCC)  F33.0      Plan: Patient is to use CBT, mindfulness and coping skills to help manage decrease symptoms associated with their diagnosis.  Patient to continue to stay organized and consistent with her schedule to keep stress low.  Patient to follow through with communication skills discussed.  Long-term goal:   Reduce overall level, frequency, and intensity of the feelings of depression, anxiety evidenced by decreased negative self talk, and helpless feelings from 6 to 7 days/week to 0 to 1 days/week per client report for at least 3 consecutive months.  Short-term goal:  Patient to increase consistency with sleep schedule getting at least 6 to 8 hours per night Patient to follow through utilizing coping skills as discussed. Patient to maintain her daily calendar and task lists to increase consistency with arriving to work on time and completing all tasks Patient to maintain consistent medication regimen and  report any concerns to prescribing provider.  Assessment of progress:  progressing    Anson [place], [facility]

## 2019-02-16 ENCOUNTER — Encounter: Payer: Self-pay | Admitting: Family Medicine

## 2019-02-28 ENCOUNTER — Other Ambulatory Visit: Payer: Self-pay

## 2019-02-28 ENCOUNTER — Ambulatory Visit (INDEPENDENT_AMBULATORY_CARE_PROVIDER_SITE_OTHER): Payer: 59 | Admitting: Mental Health

## 2019-02-28 DIAGNOSIS — F902 Attention-deficit hyperactivity disorder, combined type: Secondary | ICD-10-CM

## 2019-02-28 DIAGNOSIS — F419 Anxiety disorder, unspecified: Secondary | ICD-10-CM

## 2019-02-28 NOTE — Progress Notes (Signed)
[facility] Psychotherapy Note  Name: Debra Garza Date: 02/28/19 MRN: [id number] DOB: May 20, 1980 PCP: Tamsen Roers, PA  Time spent: 48 minutes  Treatment: Individual therapy  Mental Status Exam:   Appearance:   Casual     Behavior:  Appropriate  Motor:  Normal  Speech/Language:   Clear and Coherent  Affect:  Full range  Mood:  euthymic, anxious  Thought process:  normal  Thought content:    WNL  Sensory/Perceptual disturbances:    WNL  Orientation:  oriented to person, place, time/date and situation  Attention:  Good  Concentration:  Good  Memory:  WNL  Fund of knowledge:   Good  Insight:    Good  Judgment:   Good  Impulse Control:  Good   Reported Symptoms: Distractible, difficulty focusing/problems with concentration, problems with organization, anxiety, forgetful, sad thoughts, negative self talk  Risk Assessment: Danger to Self:  No Self-injurious Behavior: No Danger to Others: No Duty to Warn:no Physical Aggression / Violence:No  Access to Firearms a concern: No  Gang Involvement:No  Patient / guardian was educated about steps to take if suicide or homicide risk level increases between visits: yes While future psychiatric events cannot be accurately predicted, the patient does not currently require acute inpatient psychiatric care and does not currently meet [facility] involuntary commitment criteria.  Subjective:  Patient arrived on time for today's session in no apparent distress.  She stated that she was able to speak to her husband about the traffic ticket she got about a month ago as discussed in her last session.  She stated that he was not as upset and they have been able to work on some of their communication somewhat.  She stated that he continues to have a different communication style than she does going on to share the differences today in session.  She shared she feels he copes with symptoms consistent with those on the autistic  spectrum.  She stated that this can complicate their communication as she copes with ADHD.  She shared how his sister has come to live with them temporarily for the next few months and how it has been helpful in some ways as she has been verbal and identifying when her brother speaks relate or critical toward her or patient.  She stated this has changed some of his behavior in frequency and severity.  She went on to share how she is working on her daily organization at home to decline her and purge items she no longer needs.  She stated this can reduce her stress and also is part of some of the relational stress.  She stated that work is going well, arrives on time and has been efficient and finds the job rewarding.  Some ways to maintain consistency with her daily organization to achieve daily goals was explored as well as providing support throughout.  Interventions: CBT, supportive therapy, problem sovling  Diagnoses:    ICD-10-CM   1. Attention deficit hyperactivity disorder (ADHD), combined type  F90.2   2. Anxiety  F41.9      Plan: Patient is to use CBT, mindfulness and coping skills to help manage decrease symptoms associated with their diagnosis.  Patient to continue to stay organized and consistent with her schedule to keep stress low.  Patient to follow through with communication skills discussed.  Long-term goal:   Reduce overall level, frequency, and intensity of the feelings of depression, anxiety evidenced by decreased negative self talk, and helpless feelings  from 6 to 7 days/week to 0 to 1 days/week per client report for at least 3 consecutive months.  Short-term goal:  Patient to increase consistency with sleep schedule getting at least 6 to 8 hours per night Patient to follow through utilizing coping skills as discussed. Patient to maintain her daily calendar and task lists to increase consistency with arriving to work on time and completing all tasks Patient to maintain  consistent medication regimen and report any concerns to prescribing provider.  Assessment of progress:  progressing    Anson Oregon, [facility]

## 2019-03-01 ENCOUNTER — Ambulatory Visit: Payer: Self-pay | Admitting: Psychiatry

## 2019-03-19 ENCOUNTER — Ambulatory Visit (INDEPENDENT_AMBULATORY_CARE_PROVIDER_SITE_OTHER): Payer: 59 | Admitting: Psychiatry

## 2019-03-19 ENCOUNTER — Encounter: Payer: Self-pay | Admitting: Psychiatry

## 2019-03-19 ENCOUNTER — Other Ambulatory Visit: Payer: Self-pay

## 2019-03-19 DIAGNOSIS — F3342 Major depressive disorder, recurrent, in full remission: Secondary | ICD-10-CM

## 2019-03-19 DIAGNOSIS — F902 Attention-deficit hyperactivity disorder, combined type: Secondary | ICD-10-CM | POA: Diagnosis not present

## 2019-03-19 DIAGNOSIS — F419 Anxiety disorder, unspecified: Secondary | ICD-10-CM

## 2019-03-19 MED ORDER — BUPROPION HCL ER (XL) 150 MG PO TB24: 150.0000 mg | ORAL_TABLET | Freq: Every day | ORAL | 1 refills | Status: DC

## 2019-03-19 MED ORDER — FLUOXETINE HCL 10 MG PO CAPS: 10.0000 mg | ORAL_CAPSULE | Freq: Every day | ORAL | 1 refills | Status: DC

## 2019-03-19 MED ORDER — JORNAY PM 60 MG PO CP24: 60.0000 mg | ORAL_CAPSULE | Freq: Every evening | ORAL | 0 refills | Status: DC

## 2019-03-19 NOTE — Progress Notes (Signed)
PAISLY FINGERHUT [id number] 05-10-1980 39 y.o.  Subjective:   Patient ID:  Debra Garza is a 39 y.o. (DOB 08/01/1980) female.  Chief Complaint:  Chief Complaint  Patient presents with  . Follow-up    ADD, Anxiety, h/o Depression    HPI [name] presents to the office today for follow-up of ADD, anxiety, and depression. Reports that she stopped Propranolol in case of pregnancy. Reports that she is currently not pregnant and not trying to prevent pregnancy. She reports that she is pleased with response and tolerability of Jornay. She reports that she is able to get out the door and to work easily. Reports that she has been organized at work and has created tools to structure her work day. Stress is decreased and no longer clinching her jaw. Anxiety has been manageable. Denies any recent panic s/s. Denies depressed mood. Energy has been good. Reports that she is waking up around 6-7 am, even on days off. Motivation has been good. Has been more organized at home. Has cleaned out her closet and has work clothes gathered in sets. Awakening at 3 am for work. Sleep is better overall. Appetite has been ok and is now eating before work. Denies SI.   Recently moved and is trying to get settled. 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   Past medication trials: Vyvanse-effective Dexedrine-effective Adderall- mood swings, tearfulness and irritability Methylphenidate-effective for only brief periods of time Concerta-increased blood pressure Strattera-increased blood pressure. Ineffective.  Clonidine- Severe drowsiness Mirtazapine-low energy Wellbutrin XL-helpful for mood at 150 mg daily. Had increased anxiety at 300 mg dose. Trintellix-Has been helpful for mood and anxiety. Prozac   PHQ2-9     Office Visit from 12/02/2017 in [facility]  PHQ-2 Total Score  0       Review of Systems:  Review of Systems  Cardiovascular: Negative for  palpitations.  Musculoskeletal: Negative for gait problem.  Neurological: Negative for tremors and headaches.  Psychiatric/Behavioral:       Please refer to HPI    Medications: I have reviewed the patient's current medications.  Current Outpatient Medications  Medication Sig Dispense Refill  . buPROPion (WELLBUTRIN XL) 150 MG 24 hr tablet Take 1 tablet (150 mg total) by mouth daily. Take one tablet by mouth each morning. 90 tablet 1  . FLUoxetine (PROZAC) 10 MG capsule Take 1 capsule (10 mg total) by mouth daily. 90 capsule 1  . fluticasone (FLONASE) 50 MCG/ACT nasal spray Place into both nostrils daily.    [name] ON 05/14/2019] Methylphenidate HCl ER, PM, (JORNAY PM) 60 MG CP24 Take 60 mg by mouth every evening. 30 capsule 0  . [START ON 04/16/2019] Methylphenidate HCl ER, PM, (JORNAY PM) 60 MG CP24 Take 60 mg by mouth every evening. 30 capsule 0  . nitrofurantoin, macrocrystal-monohydrate, (MACROBID) 100 MG capsule Take one capsule after intercourse prn 30 capsule 3  . Prenatal Vit-Fe Fumarate-FA (PRENATAL VITAMINS) 28-0.8 MG TABS Take by mouth.    Marland Kitchen albuterol (VENTOLIN HFA) 108 (90 Base) MCG/ACT inhaler Inhale 2 puffs into the lungs as needed.    Marland Kitchen EPINEPHrine 0.3 mg/0.3 mL IJ SOAJ injection Inject 0.3 mLs (0.3 mg total) into the muscle once. 1 Device 0  . etonogestrel-ethinyl estradiol (NUVARING) 0.12-0.015 MG/24HR vaginal ring Insert vaginally and leave in place for 3 consecutive weeks, then remove for 1 week. (Patient not taking: Reported on 03/19/2019) 3 each 3  . Methylphenidate HCl ER, PM, (JORNAY PM) 60 MG  CP24 Take 60 mg by mouth every evening. 90 capsule 0  . rizatriptan (MAXALT) 10 MG tablet Take 1 tablet (10 mg total) by mouth as needed for migraine. May repeat in 2 hours if needed 10 tablet 0  . traZODone (DESYREL) 50 MG tablet Take 1-2 tabs po QHS prn insomnia 180 tablet 1   No current facility-administered medications for this visit.    Medication Side Effects:  None  Allergies:  Allergies  Allergen Reactions  . Banana Anaphylaxis  . Other Anaphylaxis    Chickpeas  . Augmentin [Amoxicillin-Pot Clavulanate] Hives  . Codeine     Other reaction(s): Vomiting  . Hydrocodone Other (See Comments)  . Penicillins Hives  . Sudafed [Pseudoephedrine Hcl]   . Tape Rash    Past Medical History:  Diagnosis Date  . ADD (attention deficit disorder)   . Allergic rhinitis   . Anxiety   . Asthma   . Depression    DR. KAUR  . Insomnia   . Migraine   . Other abnormal glucose    SEASONAL ALLERGIES  . Panic attacks   . Strabismic amblyopia of right eye     Family History  Problem Relation Age of Onset  . Healthy Mother   . Cancer Mother        BASEL CELL 40  . Breast cancer Mother 39       DCIS  . Healthy Father   . Hyperlipidemia Father   . Cancer Father        BASEL CELL 40  . ADD / ADHD Father   . Healthy Sister   . Depression Sister   . Liver cancer Maternal Grandmother   . Cancer Maternal Grandmother        COLON  . Schizophrenia Maternal Grandmother   . Dementia Maternal Grandfather     Social History   Socioeconomic History  . Marital status: Married    Spouse name: [name]  . Number of children: 0  . Years of education: 52  . Highest education level: Not on file  Occupational History  . Occupation: NURSE  Tobacco Use  . Smoking status: Never Smoker  . Smokeless tobacco: Never Used  Substance and Sexual Activity  . Alcohol use: Yes    Alcohol/week: 1.0 - 2.0 standard drinks    Types: 1 - 2 Cans of beer per week    Comment: occasionally  . Drug use: No  . Sexual activity: Yes    Partners: Male    Birth control/protection: Other-see comments    Comment: Nuvaring  Other Topics Concern  . Not on file  Social History Narrative  . Not on file   Social Determinants of Health   Financial Resource Strain:   . Difficulty of Paying Living Expenses: Not on file  Food Insecurity:   . Worried About Programme researcher, broadcasting/film/video in  the Last Year: Not on file  . Ran Out of Food in the Last Year: Not on file  Transportation Needs:   . Lack of Transportation (Medical): Not on file  . Lack of Transportation (Non-Medical): Not on file  Physical Activity:   . Days of Exercise per Week: Not on file  . Minutes of Exercise per Session: Not on file  Stress:   . Feeling of Stress : Not on file  Social Connections:   . Frequency of Communication with Friends and Family: Not on file  . Frequency of Social Gatherings with Friends and Family: Not on file  .  Attends Religious Services: Not on file  . Active Member of Clubs or Organizations: Not on file  . Attends Archivist Meetings: Not on file  . Marital Status: Not on file  Intimate Partner Violence:   . Fear of Current or Ex-Partner: Not on file  . Emotionally Abused: Not on file  . Physically Abused: Not on file  . Sexually Abused: Not on file    Past Medical History, Surgical history, Social history, and Family history were reviewed and updated as appropriate.   Please see review of systems for further details on the patient's review from today.   Objective:   Physical Exam:  BP (!) 142/85   Pulse 100   Physical Exam Constitutional:      General: She is not in acute distress.    Appearance: She is well-developed.  Musculoskeletal:        General: No deformity.  Neurological:     Mental Status: She is alert and oriented to person, place, and time.     Coordination: Coordination normal.  Psychiatric:        Attention and Perception: Attention and perception normal. She does not perceive auditory or visual hallucinations.        Mood and Affect: Mood normal. Mood is not anxious or depressed. Affect is not labile, blunt, angry or inappropriate.        Speech: Speech normal.        Behavior: Behavior normal.        Thought Content: Thought content normal. Thought content is not paranoid or delusional. Thought content does not include homicidal or  suicidal ideation. Thought content does not include homicidal or suicidal plan.        Cognition and Memory: Cognition and memory normal.        Judgment: Judgment normal.     Comments: Insight intact     Lab Review:     Component Value Date/Time   NA 134 (L) 10/23/2015 0146   K 3.3 (L) 10/23/2015 0146   CL 103 10/23/2015 0146   CO2 23 10/23/2015 0146   GLUCOSE 125 (H) 10/23/2015 0146   BUN 11 10/23/2015 0146   CREATININE 0.90 10/23/2015 0146   CALCIUM 9.4 10/23/2015 0146   GFRNONAA >60 10/23/2015 0146   GFRAA >60 10/23/2015 0146       Component Value Date/Time   WBC 7.1 04/05/2017 1206   WBC 9.2 10/23/2015 0146   RBC 4.87 04/05/2017 1206   RBC 5.42 (H) 10/23/2015 0146   HGB 14.3 04/05/2017 1206   HCT 43.0 04/05/2017 1206   PLT 291 04/05/2017 1206   MCV 88 04/05/2017 1206   MCH 29.4 04/05/2017 1206   MCH 29.6 10/23/2015 0146   MCHC 33.3 04/05/2017 1206   MCHC 34.9 10/23/2015 0146   RDW 12.8 04/05/2017 1206   LYMPHSABS 2.9 04/05/2017 1206   EOSABS 0.3 04/05/2017 1206   BASOSABS 0.0 04/05/2017 1206    No results found for: POCLITH, LITHIUM   No results found for: PHENYTOIN, PHENOBARB, VALPROATE, CBMZ   .res Assessment: Plan:   Will continue current plan of care since target signs and symptoms are well controlled without any tolerability issues. Discussed stopping Oneida Alar PM if she learns that she is pregnant. Continue Jornay PM 60 mg po q evening. Continue Prozac 10 mg qd for mood and anxiety.  Continue Wellbutrin XL 150 mg qd for depression.  Recommend continuing psychotherapy with Lanetta Inch, [facility] Patient to follow-up with this provider in 3 months  or sooner if clinically indicated. Patient advised to contact office with any questions, adverse effects, or acute worsening in signs and symptoms.  [name] was seen today for follow-up.  Diagnoses and all orders for this visit:  Attention deficit hyperactivity disorder (ADHD), combined type -      Methylphenidate HCl ER, PM, (JORNAY PM) 60 MG CP24; Take 60 mg by mouth every evening. -     Methylphenidate HCl ER, PM, (JORNAY PM) 60 MG CP24; Take 60 mg by mouth every evening. -     Methylphenidate HCl ER, PM, (JORNAY PM) 60 MG CP24; Take 60 mg by mouth every evening.  Recurrent major depressive disorder, in full remission (HCC) -     buPROPion (WELLBUTRIN XL) 150 MG 24 hr tablet; Take 1 tablet (150 mg total) by mouth daily. Take one tablet by mouth each morning. -     FLUoxetine (PROZAC) 10 MG capsule; Take 1 capsule (10 mg total) by mouth daily.  Anxiety -     FLUoxetine (PROZAC) 10 MG capsule; Take 1 capsule (10 mg total) by mouth daily.     Please see After Visit Summary for patient specific instructions.  Future Appointments  Date Time Provider Department Center  03/22/2019 10:00 AM Waldron Session, [facility] None  04/11/2019 10:00 AM Waldron Session, [facility] None  06/15/2019 10:00 AM Corie Chiquito, [facility] CP-[facility] None    No orders of the defined types were placed in this encounter.   -------------------------------

## 2019-03-22 ENCOUNTER — Ambulatory Visit: Payer: 59 | Admitting: Mental Health

## 2019-04-11 ENCOUNTER — Other Ambulatory Visit: Payer: Self-pay

## 2019-04-11 ENCOUNTER — Ambulatory Visit (INDEPENDENT_AMBULATORY_CARE_PROVIDER_SITE_OTHER): Payer: 59 | Admitting: Mental Health

## 2019-04-11 DIAGNOSIS — F419 Anxiety disorder, unspecified: Secondary | ICD-10-CM

## 2019-04-11 DIAGNOSIS — F902 Attention-deficit hyperactivity disorder, combined type: Secondary | ICD-10-CM

## 2019-04-11 NOTE — Progress Notes (Signed)
[facility] Psychotherapy Note  Name: Debra Garza Date: 04/11/19 MRN: [id number] DOB: 05/21/1980 PCP: Tamsen Roers, PA  Time spent: 50 minutes  Treatment: Individual therapy  Mental Status Exam:   Appearance:   Casual     Behavior:  Appropriate  Motor:  Normal  Speech/Language:   Clear and Coherent  Affect:  Full range  Mood:  euthymic, anxious  Thought process:  normal  Thought content:    WNL  Sensory/Perceptual disturbances:    WNL  Orientation:  oriented to person, place, time/date and situation  Attention:  Good  Concentration:  Good  Memory:  WNL  Fund of knowledge:   Good  Insight:    Good  Judgment:   Good  Impulse Control:  Good   Reported Symptoms: Distractible, difficulty focusing/problems with concentration, problems with organization, anxiety, forgetful, sad thoughts, negative self talk  Risk Assessment: Danger to Self:  No Self-injurious Behavior: No Danger to Others: No Duty to Warn:no Physical Aggression / Violence:No  Access to Firearms a concern: No  Gang Involvement:No  Patient / guardian was educated about steps to take if suicide or homicide risk level increases between visits: yes While future psychiatric events cannot be accurately predicted, the patient does not currently require acute inpatient psychiatric care and does not currently meet [facility] involuntary commitment criteria.  Subjective:  Patient arrived on time for today's session. She continues to thrive at her new job, sharing experiences the continued structure of this job has been helpful for her and she acknowledges that is a good fit all around. She continues to have some marital stress, going on to share details. She stated he can make demeaning statements to her and how she copes with this has been challenging. His comments have been hurtful and she wants him to get counseling. She plans to follow through on this between sessions, as she stated that he  will not pursue this independently but she hopes that he will with her assistance. Through discovery she identified some past experiences as well as ongoing experiences in her marriage that can relate to her feelings of low self-worth. Patient was encouraged to notice progress, personal strengths and how she has thrived recently at work as well as in ways she has made attempts to communicate in her marital relationship.   Interventions: CBT, supportive therapy, problem sovling  Diagnoses:    ICD-10-CM   1. Attention deficit hyperactivity disorder (ADHD), combined type  F90.2   2. Anxiety  F41.9      Plan: Patient is to use CBT, mindfulness and coping skills to help manage decrease symptoms associated with their diagnosis.  Patient to continue to stay organized and consistent with her schedule to keep stress low.  Patient to follow through with communication skills discussed.  Long-term goal:   Reduce overall level, frequency, and intensity of the feelings of depression, anxiety evidenced by decreased negative self talk, and helpless feelings from 6 to 7 days/week to 0 to 1 days/week per client report for at least 3 consecutive months.  Short-term goal:  Patient to increase consistency with sleep schedule getting at least 6 to 8 hours per night Patient to follow through utilizing coping skills as discussed. Patient to maintain her daily calendar and task lists to increase consistency with arriving to work on time and completing all tasks Patient to maintain consistent medication regimen and report any concerns to prescribing provider.  Assessment of progress:  progressing    Waldron Session, [facility]

## 2019-05-14 ENCOUNTER — Other Ambulatory Visit: Payer: Self-pay

## 2019-05-14 ENCOUNTER — Ambulatory Visit (INDEPENDENT_AMBULATORY_CARE_PROVIDER_SITE_OTHER): Payer: 59 | Admitting: Mental Health

## 2019-05-14 ENCOUNTER — Telehealth: Payer: Self-pay | Admitting: Psychiatry

## 2019-05-14 DIAGNOSIS — F902 Attention-deficit hyperactivity disorder, combined type: Secondary | ICD-10-CM

## 2019-05-14 DIAGNOSIS — F419 Anxiety disorder, unspecified: Secondary | ICD-10-CM | POA: Diagnosis not present

## 2019-05-14 NOTE — Telephone Encounter (Signed)
Pt needs refill on JORNAY PM 60 MG. Please send to [facility] [place], [place]

## 2019-05-14 NOTE — Telephone Encounter (Signed)
Refill is already on file for today.

## 2019-05-14 NOTE — Progress Notes (Signed)
[facility] Psychotherapy Note  Name: Debra Garza Date: 05/14/19 MRN: [id number] DOB: [birth year] PCP: Tamsen Roers, PA  Time spent: 52 minutes  Treatment: Individual therapy  Mental Status Exam:   Appearance:   Casual     Behavior:  Appropriate  Motor:  Normal  Speech/Language:   Clear and Coherent  Affect:  Full range  Mood:  euthymic, anxious  Thought process:  normal  Thought content:    WNL  Sensory/Perceptual disturbances:    WNL  Orientation:  oriented to person, place, time/date and situation  Attention:  Good  Concentration:  Good  Memory:  WNL  Fund of knowledge:   Good  Insight:    Good  Judgment:   Good  Impulse Control:  Good   Reported Symptoms: Distractible, difficulty focusing/problems with concentration, problems with organization, anxiety, forgetful, sad thoughts, negative self talk  Risk Assessment: Danger to Self:  No Self-injurious Behavior: No Danger to Others: No Duty to Warn:no Physical Aggression / Violence:No  Access to Firearms a concern: No  Gang Involvement:No  Patient / guardian was educated about steps to take if suicide or homicide risk level increases between visits: yes While future psychiatric events cannot be accurately predicted, the patient does not currently require acute inpatient psychiatric care and does not currently meet [facility] involuntary commitment criteria.  Subjective:  Patient shared progress, how she is working on getting her consistency with taking medications.  She shared how she has forgotten to take one of her antidepressants recently and we explore ways to increase some consistency with her scheduling and integrating some changes in her behaviors.  She continues to thrive at work, enjoys her job.  Her marital relationship was assessed.  She stated that it has been less stressful recently going on to share recent discussion she had with her husband resulting in his being less negative and  critical of her.  She stated that he has some self-esteem issues, sharing some of his life challenges that have played a role in this development.  She has been able to follow through in taking some steps to be more assertive with her communication with him when needed.  Some ways to continue to effectively communicate were explored.  She has been taking steps to work out in the morning, wants to increase activities in this area to take steps toward her ideal body image.   Interventions: CBT, supportive therapy, problem sovling  Diagnoses:    ICD-10-CM   1. Attention deficit hyperactivity disorder (ADHD), combined type  F90.2   2. Anxiety  F41.9      Plan: Patient is to use CBT, mindfulness and coping skills to help manage decrease symptoms associated with their diagnosis.  Patient to continue to stay organized and consistent with her schedule to keep stress low.  Patient to follow through with communication skills discussed.  Long-term goal:   Reduce overall level, frequency, and intensity of the feelings of depression, anxiety evidenced by decreased negative self talk, and helpless feelings from 6 to 7 days/week to 0 to 1 days/week per client report for at least 3 consecutive months.  Short-term goal:  Patient to increase consistency with sleep schedule getting at least 6 to 8 hours per night Patient to follow through utilizing coping skills as discussed. Patient to maintain her daily calendar and task lists to increase consistency with arriving to work on time and completing all tasks Patient to maintain consistent medication regimen and report any concerns to prescribing provider.  Assessment of progress:  progressing    Anson [place], [facility]

## 2019-06-14 ENCOUNTER — Ambulatory Visit: Payer: 59 | Admitting: Mental Health

## 2019-06-15 ENCOUNTER — Other Ambulatory Visit: Payer: Self-pay

## 2019-06-15 ENCOUNTER — Ambulatory Visit (INDEPENDENT_AMBULATORY_CARE_PROVIDER_SITE_OTHER): Payer: 59 | Admitting: Psychiatry

## 2019-06-15 ENCOUNTER — Encounter: Payer: Self-pay | Admitting: Psychiatry

## 2019-06-15 DIAGNOSIS — F3342 Major depressive disorder, recurrent, in full remission: Secondary | ICD-10-CM | POA: Diagnosis not present

## 2019-06-15 DIAGNOSIS — F419 Anxiety disorder, unspecified: Secondary | ICD-10-CM

## 2019-06-15 DIAGNOSIS — F902 Attention-deficit hyperactivity disorder, combined type: Secondary | ICD-10-CM

## 2019-06-15 MED ORDER — JORNAY PM 60 MG PO CP24: 60.0000 mg | ORAL_CAPSULE | Freq: Every evening | ORAL | 0 refills | Status: DC

## 2019-06-15 MED ORDER — PROPRANOLOL HCL 10 MG PO TABS: ORAL_TABLET | ORAL | 1 refills | Status: DC

## 2019-06-15 MED ORDER — FLUOXETINE HCL 10 MG PO CAPS: 10.0000 mg | ORAL_CAPSULE | Freq: Every day | ORAL | 1 refills | Status: DC

## 2019-06-15 MED ORDER — BUPROPION HCL ER (XL) 150 MG PO TB24: 150.0000 mg | ORAL_TABLET | Freq: Every day | ORAL | 1 refills | Status: DC

## 2019-06-15 NOTE — Progress Notes (Signed)
Debra Garza [id number] [birth year] 39 y.o.  Subjective:   Patient ID:  Debra Garza is a 39 y.o. (DOB [birth year]) female.  Chief Complaint: No chief complaint on file.   HPI Debra Garza presents to the office today for follow-up of ADD, Depression, and anxiety. She reports that she has not been taking [place] PM for the last few weeks because pharmacy said they did not have a script on file. She reports that she has noticed that her concentration has not been as good without [place] PM. She has recently had to take some Vyvanse due to not having [place]. She continues to use lists at work to help compensate.   She reports that her mood has been "ok." She reports that she was trying to take her medications separately and was forgetting to take them. She reports that she is now taking her morning meds at the same time for the last couple of weeks and this has helped with taking meds more consistently. She reports that her anxiety has been fair. Sleep has been adequate with some recent sleep disturbance while taking care of her parents' dogs. Appetite has been good, including when she is taking [place] PM. Eating first thing, then a breakfast break, lunch, and then dinner. Energy has been good. Motivation varies from day to day. Has been trying to clean out closets. Denies SI.   Has been working extra hours and has been helping cover for other facilities. Averaging about 56 hours a week. Awakens at 3 am for work.   Has decreased caffeine intake to 2 cups a day and has started restricting caffeine later in the day.   Has returned to school. Will be taking [employer].   Past medication trials: Vyvanse-effective Dexedrine-effective Adderall- mood swings, tearfulness and irritability Methylphenidate-effective for only brief periods of time Concerta-increased blood pressure Strattera-increased blood pressure. Ineffective.  Clonidine- Severe  drowsiness Mirtazapine-low energy Wellbutrin XL-helpful for mood at 150 mg daily. Had increased anxiety at 300 mg dose. Trintellix-Has been helpful for mood and anxiety. Prozac  PHQ2-9     Office Visit from 12/02/2017 in [facility]  PHQ-2 Total Score  0       Review of Systems:  Review of Systems  Musculoskeletal: Negative for gait problem.  Neurological: Negative for tremors and headaches.  Psychiatric/Behavioral:       Please refer to HPI    Medications: I have reviewed the patient's current medications.  Current Outpatient Medications  Medication Sig Dispense Refill  . albuterol (VENTOLIN HFA) 108 (90 Base) MCG/ACT inhaler Inhale 2 puffs into the lungs as needed.    Marland Kitchen buPROPion (WELLBUTRIN XL) 150 MG 24 hr tablet Take 1 tablet (150 mg total) by mouth daily. Take one tablet by mouth each morning. 90 tablet 1  . EPINEPHrine 0.3 mg/0.3 mL IJ SOAJ injection Inject 0.3 mLs (0.3 mg total) into the muscle once. 1 Device 0  . etonogestrel-ethinyl estradiol (NUVARING) 0.12-0.015 MG/24HR vaginal ring Insert vaginally and leave in place for 3 consecutive weeks, then remove for 1 week. (Patient not taking: Reported on 03/19/2019) 3 each 3  . FLUoxetine (PROZAC) 10 MG capsule Take 1 capsule (10 mg total) by mouth daily. 90 capsule 1  . fluticasone (FLONASE) 50 MCG/ACT nasal spray Place into both nostrils daily.    . Methylphenidate HCl ER, PM, (JORNAY PM) 60 MG CP24 Take 60 mg by mouth every evening. 30 capsule 0  . Methylphenidate HCl ER, PM, (JORNAY PM) 60 MG  CP24 Take 60 mg by mouth every evening. 30 capsule 0  . Methylphenidate HCl ER, PM, (JORNAY PM) 60 MG CP24 Take 60 mg by mouth every evening. 90 capsule 0  . nitrofurantoin, macrocrystal-monohydrate, (MACROBID) 100 MG capsule Take one capsule after intercourse prn 30 capsule 3  . Prenatal Vit-Fe Fumarate-FA (PRENATAL VITAMINS) 28-0.8 MG TABS Take by mouth.    . rizatriptan (MAXALT) 10 MG tablet Take 1 tablet (10 mg  total) by mouth as needed for migraine. May repeat in 2 hours if needed 10 tablet 0  . traZODone (DESYREL) 50 MG tablet Take 1-2 tabs po QHS prn insomnia 180 tablet 1   No current facility-administered medications for this visit.    Medication Side Effects: Other: Occ increased HR with exertion and stress  Allergies:  Allergies  Allergen Reactions  . Banana Anaphylaxis  . Other Anaphylaxis    Chickpeas  . Augmentin [Amoxicillin-Pot Clavulanate] Hives  . Codeine     Other reaction(s): Vomiting  . Hydrocodone Other (See Comments)  . Penicillins Hives  . Sudafed [Pseudoephedrine Hcl]   . Tape Rash    Past Medical History:  Diagnosis Date  . ADD (attention deficit disorder)   . Allergic rhinitis   . Anxiety   . Asthma   . Depression    DR. KAUR  . Insomnia   . Migraine   . Other abnormal glucose    SEASONAL ALLERGIES  . Panic attacks   . Strabismic amblyopia of right eye     Family History  Problem Relation Age of Onset  . Healthy Mother   . Cancer Mother        BASEL CELL 35  . Breast cancer Mother 38       DCIS  . Healthy Father   . Hyperlipidemia Father   . Cancer Father        BASEL CELL 40  . ADD / ADHD Father   . Healthy Sister   . Depression Sister   . Liver cancer Maternal Grandmother   . Cancer Maternal Grandmother        COLON  . Schizophrenia Maternal Grandmother   . Dementia Maternal Grandfather     Social History   Socioeconomic History  . Marital status: Married    Spouse name: Debra Garza  . Number of children: 0  . Years of education: 76  . Highest education level: Not on file  Occupational History  . Occupation: NURSE  Tobacco Use  . Smoking status: Never Smoker  . Smokeless tobacco: Never Used  Substance and Sexual Activity  . Alcohol use: Yes    Alcohol/week: 1.0 - 2.0 standard drinks    Types: 1 - 2 Cans of beer per week    Comment: occasionally  . Drug use: No  . Sexual activity: Yes    Partners: Male    Birth  control/protection: Other-see comments    Comment: Nuvaring  Other Topics Concern  . Not on file  Social History Narrative  . Not on file   Social Determinants of Health   Financial Resource Strain:   . Difficulty of Paying Living Expenses:   Food Insecurity:   . Worried About Charity fundraiser in the Last Year:   . Arboriculturist in the Last Year:   Transportation Needs:   . Film/video editor (Medical):   Marland Kitchen Lack of Transportation (Non-Medical):   Physical Activity:   . Days of Exercise per Week:   . Minutes of Exercise  per Session:   Stress:   . Feeling of Stress :   Social Connections:   . Frequency of Communication with Friends and Family:   . Frequency of Social Gatherings with Friends and Family:   . Attends Religious Services:   . Active Member of Clubs or Organizations:   . Attends Banker Meetings:   Marland Kitchen Marital Status:   Intimate Partner Violence:   . Fear of Current or Ex-Partner:   . Emotionally Abused:   Marland Kitchen Physically Abused:   . Sexually Abused:     Past Medical History, Surgical history, Social history, and Family history were reviewed and updated as appropriate.   Please see review of systems for further details on the patient's review from today.   Objective:   Physical Exam:  There were no vitals taken for this visit.  Physical Exam Constitutional:      General: She is not in acute distress. Musculoskeletal:        General: No deformity.  Neurological:     Mental Status: She is alert and oriented to person, place, and time.     Coordination: Coordination normal.  Psychiatric:        Attention and Perception: Attention and perception normal. She does not perceive auditory or visual hallucinations.        Mood and Affect: Mood normal. Mood is not anxious or depressed. Affect is not labile, blunt, angry or inappropriate.        Speech: Speech normal.        Behavior: Behavior normal.        Thought Content: Thought content  normal. Thought content is not paranoid or delusional. Thought content does not include homicidal or suicidal ideation. Thought content does not include homicidal or suicidal plan.        Cognition and Memory: Cognition and memory normal.        Judgment: Judgment normal.     Comments: Insight intact     Lab Review:     Component Value Date/Time   NA 134 (L) 10/23/2015 0146   K 3.3 (L) 10/23/2015 0146   CL 103 10/23/2015 0146   CO2 23 10/23/2015 0146   GLUCOSE 125 (H) 10/23/2015 0146   BUN 11 10/23/2015 0146   CREATININE 0.90 10/23/2015 0146   CALCIUM 9.4 10/23/2015 0146   GFRNONAA >60 10/23/2015 0146   GFRAA >60 10/23/2015 0146       Component Value Date/Time   WBC 7.1 04/05/2017 1206   WBC 9.2 10/23/2015 0146   RBC 4.87 04/05/2017 1206   RBC 5.42 (H) 10/23/2015 0146   HGB 14.3 04/05/2017 1206   HCT 43.0 04/05/2017 1206   PLT 291 04/05/2017 1206   MCV 88 04/05/2017 1206   MCH 29.4 04/05/2017 1206   MCH 29.6 10/23/2015 0146   MCHC 33.3 04/05/2017 1206   MCHC 34.9 10/23/2015 0146   RDW 12.8 04/05/2017 1206   LYMPHSABS 2.9 04/05/2017 1206   EOSABS 0.3 04/05/2017 1206   BASOSABS 0.0 04/05/2017 1206    No results found for: POCLITH, LITHIUM   No results found for: PHENYTOIN, PHENOBARB, VALPROATE, CBMZ   .res Assessment: Plan:   Will continue current plan of care since target signs and symptoms are well controlled without any tolerability issues. Will print script for 90-day supply of Jornay PM. Provided pt with written information from [facility] re: current medications in pregnancy and breastfeeding since pt reports that she and her husband are contemplating trying to conceive  in the future. Discussed that she would need to stop Jornay PM and Propranolol if she were to become pregnant. Recommend continuing psychotherapy with Elio Forget, [facility]. Pt to f/u with this provider in 3 months or sooner if clinically indicated.  Patient advised to contact office with any  questions, adverse effects, or acute worsening in signs and symptoms.  There are no diagnoses linked to this encounter.   Please see After Visit Summary for patient specific instructions.  Future Appointments  Date Time Provider Department Center  07/09/2019  8:00 AM Waldron Session, [facility] None    No orders of the defined types were placed in this encounter.   -------------------------------

## 2019-07-07 ENCOUNTER — Other Ambulatory Visit: Payer: Self-pay | Admitting: Psychiatry

## 2019-07-07 DIAGNOSIS — F419 Anxiety disorder, unspecified: Secondary | ICD-10-CM

## 2019-07-09 ENCOUNTER — Other Ambulatory Visit: Payer: Self-pay

## 2019-07-09 ENCOUNTER — Ambulatory Visit (INDEPENDENT_AMBULATORY_CARE_PROVIDER_SITE_OTHER): Payer: 59 | Admitting: Mental Health

## 2019-07-09 DIAGNOSIS — F419 Anxiety disorder, unspecified: Secondary | ICD-10-CM

## 2019-07-09 DIAGNOSIS — F902 Attention-deficit hyperactivity disorder, combined type: Secondary | ICD-10-CM | POA: Diagnosis not present

## 2019-07-09 NOTE — Progress Notes (Signed)
[facility] Psychotherapy Note  Name: Debra Garza Date: 07/09/19 MRN: [id number] DOB: 1980/05/08 PCP: Tamsen Roers, PA  Time spent: 50 minutes  Treatment: Individual therapy  Mental Status Exam:   Appearance:   Casual     Behavior:  Appropriate  Motor:  Normal  Speech/Language:   Clear and Coherent  Affect:  Full range  Mood:  euthymic, anxious  Thought process:  normal  Thought content:    WNL  Sensory/Perceptual disturbances:    WNL  Orientation:  oriented to person, place, time/date and situation  Attention:  Good  Concentration:  Good  Memory:  WNL  Fund of knowledge:   Good  Insight:    Good  Judgment:   Good  Impulse Control:  Good   Reported Symptoms: Distractible, difficulty focusing/problems with concentration, problems with organization, anxiety, forgetful, sad thoughts, negative self talk  Risk Assessment: Danger to Self:  No Self-injurious Behavior: No Danger to Others: No Duty to Warn:no Physical Aggression / Violence:No  Access to Firearms a concern: No  Gang Involvement:No  Patient / guardian was educated about steps to take if suicide or homicide risk level increases between visits: yes While future psychiatric events cannot be accurately predicted, the patient does not currently require acute inpatient psychiatric care and does not currently meet [facility] involuntary commitment criteria.  Subjective:  Patient arrived on time for today's session, sharing recent progress.  She stated that she has engaged in college coursework to obtain her BSN.  She mentions some frustrations with some of her professors but overall is doing well thus far.  She continues to identify her primary stressor which is her relationship with her husband, going on to share how they have problems with communication.  She shared how she needs him to be more patient as he may ask her questions about why she has not completed a task etc. this making her feel  anxious and frustrated.  Some ways to communicate this need were explored during today's session.  She continues to encourage him to consider individual counseling and request that he come to our next session to provide feedback and insights related to her anxiety and functioning.  She plans to utilize communication skills discussed.   Interventions: CBT, supportive therapy, problem sovling  Diagnoses:    ICD-10-CM   1. Anxiety  F41.9   2. Attention deficit hyperactivity disorder (ADHD), combined type  F90.2      Plan: Patient is to use CBT, mindfulness and coping skills to help manage decrease symptoms associated with their diagnosis.  Patient to continue to stay organized and consistent with her schedule to keep stress low.  Patient to follow through with communication skills discussed.  Long-term goal:   Reduce overall level, frequency, and intensity of the feelings of depression, anxiety evidenced by decreased negative self talk, and helpless feelings from 6 to 7 days/week to 0 to 1 days/week per client report for at least 3 consecutive months.  Short-term goal:  Patient to increase consistency with sleep schedule getting at least 6 to 8 hours per night Patient to follow through utilizing coping skills as discussed. Patient to maintain her daily calendar and task lists to increase consistency with arriving to work on time and completing all tasks Patient to maintain consistent medication regimen and report any concerns to prescribing provider.  Assessment of progress:  progressing    Waldron Session, [facility]

## 2019-07-15 ENCOUNTER — Other Ambulatory Visit: Payer: Self-pay | Admitting: Psychiatry

## 2019-07-15 DIAGNOSIS — F5101 Primary insomnia: Secondary | ICD-10-CM

## 2019-07-23 ENCOUNTER — Other Ambulatory Visit: Payer: Self-pay | Admitting: Psychiatry

## 2019-07-23 DIAGNOSIS — F419 Anxiety disorder, unspecified: Secondary | ICD-10-CM

## 2019-08-06 ENCOUNTER — Ambulatory Visit (INDEPENDENT_AMBULATORY_CARE_PROVIDER_SITE_OTHER): Payer: 59 | Admitting: Mental Health

## 2019-08-06 ENCOUNTER — Other Ambulatory Visit: Payer: Self-pay

## 2019-08-06 DIAGNOSIS — F902 Attention-deficit hyperactivity disorder, combined type: Secondary | ICD-10-CM

## 2019-08-06 DIAGNOSIS — F330 Major depressive disorder, recurrent, mild: Secondary | ICD-10-CM

## 2019-08-12 NOTE — Progress Notes (Signed)
[facility] Psychotherapy Note  Name: Debra Garza Date: 08/06/19 MRN: [id number] DOB: January 16, 1981 PCP: Tamsen Roers, PA  Time spent: 51 minutes  Treatment: Individual therapy  Mental Status Exam:   Appearance:   Casual     Behavior:  Appropriate  Motor:  Normal  Speech/Language:   Clear and Coherent  Affect:  Full range  Mood:  euthymic, anxious  Thought process:  normal  Thought content:    WNL  Sensory/Perceptual disturbances:    WNL  Orientation:  oriented to person, place, time/date and situation  Attention:  Good  Concentration:  Good  Memory:  WNL  Fund of knowledge:   Good  Insight:    Good  Judgment:   Good  Impulse Control:  Good   Reported Symptoms: Distractible, difficulty focusing/problems with concentration, problems with organization, anxiety, forgetful, sad thoughts, negative self talk  Risk Assessment: Danger to Self:  No Self-injurious Behavior: No Danger to Others: No Duty to Warn:no Physical Aggression / Violence:No  Access to Firearms a concern: No  Gang Involvement:No  Patient / guardian was educated about steps to take if suicide or homicide risk level increases between visits: yes While future psychiatric events cannot be accurately predicted, the patient does not currently require acute inpatient psychiatric care and does not currently meet [facility] involuntary commitment criteria.  Subjective:  Patient presents for session with husband initially.  He shared various issues he feels with which she struggles with daily.  He stated that her lack of effort in keeping up with tasks at home, procrastination, her depression and lastly her ADHD as problem areas for her.  He stated that he is on order of severity.  He stated that he keeps up with various tasks and at times how they both can work together to keep up but that he wants her more involved.  They had an argument last night and they both agree that their communication  needs work.  We recommended they consider couples counseling where he was receptive as well as looking into individual counseling for himself.  In meeting with patient individually, she expressed how she agreed with some of his comments however, that he does not fully understand her depression and its combination with her ADHD symptoms affects her ability to be as consistent as she would like.  She expressed that he would benefit from individual therapy and plans to further discuss with him about taking steps as he has been hesitant in the past.  She expresses being hopeful about her situation, this was encouraged as we identified how her husband wanting to express himself today and talking about engaging in treatment as things that she can keep a focus to her maintain her hopeful mindset about their relationship.  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   Interventions: CBT, supportive therapy, problem sovling  Diagnoses:    ICD-10-CM   1. Attention deficit hyperactivity disorder (ADHD), combined type  F90.2   2. Mild episode of recurrent major depressive disorder (HCC)  F33.0      Plan: Patient is to use CBT, mindfulness and coping skills to help manage decrease symptoms associated with their diagnosis.  Patient to continue to stay organized and consistent with her schedule to keep stress low.  Patient to follow through  with communication skills discussed.  Long-term goal:   Reduce overall level, frequency, and intensity of the feelings of depression, anxiety evidenced by decreased negative self talk, and helpless feelings from 6 to 7 days/week to 0 to 1 days/week per client report for at least 3 consecutive months.  Short-term goal:  Patient to increase consistency with sleep schedule getting at least 6 to  8 hours per night Patient to follow through utilizing coping skills as discussed. Patient to maintain her daily calendar and task lists to increase consistency with arriving to work on time and completing all tasks Patient to maintain consistent medication regimen and report any concerns to prescribing provider.  Assessment of progress:  progressing    Waldron Session, [facility]

## 2019-08-28 ENCOUNTER — Ambulatory Visit (INDEPENDENT_AMBULATORY_CARE_PROVIDER_SITE_OTHER): Payer: 59 | Admitting: Psychiatry

## 2019-08-28 ENCOUNTER — Encounter: Payer: Self-pay | Admitting: Psychiatry

## 2019-08-28 ENCOUNTER — Other Ambulatory Visit: Payer: Self-pay

## 2019-08-28 DIAGNOSIS — F419 Anxiety disorder, unspecified: Secondary | ICD-10-CM | POA: Diagnosis not present

## 2019-08-28 DIAGNOSIS — F3342 Major depressive disorder, recurrent, in full remission: Secondary | ICD-10-CM

## 2019-08-28 DIAGNOSIS — F902 Attention-deficit hyperactivity disorder, combined type: Secondary | ICD-10-CM | POA: Diagnosis not present

## 2019-08-28 MED ORDER — PROPRANOLOL HCL 10 MG PO TABS: ORAL_TABLET | ORAL | 0 refills | Status: DC

## 2019-08-28 MED ORDER — JORNAY PM 60 MG PO CP24: 60.0000 mg | ORAL_CAPSULE | Freq: Every evening | ORAL | 0 refills | Status: DC

## 2019-08-28 MED ORDER — FLUOXETINE HCL 10 MG PO CAPS: 10.0000 mg | ORAL_CAPSULE | Freq: Every day | ORAL | 1 refills | Status: DC

## 2019-08-28 MED ORDER — BUPROPION HCL ER (XL) 150 MG PO TB24: 150.0000 mg | ORAL_TABLET | Freq: Every day | ORAL | 1 refills | Status: DC

## 2019-08-28 NOTE — Progress Notes (Signed)
Debra Garza [id number] 26-Apr-1980 39 y.o.  Subjective:   Patient ID:  Debra Garza is a 39 y.o. (DOB 26-Apr-1980) female.  Chief Complaint:  Chief Complaint  Patient presents with  . Follow-up    ADD, h/o anxiety and depression    HPI Debra Garza presents to the office today for follow-up of ADD, anxiety, and depression. She reports that concentration has been ok. Continues to have some difficulty with waking up early in time for work and this has improved. Mood has been "alright." She reports that she has remained calm in situations that could have triggered irritability. Denies anxiety attacks. Reports occ increased heart rate with anxiety at work. She reports that she does not have increased HR consistently with increased stress at work. She reports adequate sleep with occ difficulty falling asleep. Appetite has been stable. Energy and motivation have been good. Denies SI.   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   She reports that she has decreased caffeine consumption. She reports that she has been more consistent with taking medications upon awakening. Started returning to the gym on days off. Has been trying to start different routines. Waking up earlier on days off.   Has been trying to conceive.  Past medication trials: Vyvanse-effective Dexedrine-effective Adderall- mood swings, tearfulness and irritability Methylphenidate-effective for only brief periods of time Concerta-increased blood pressure Strattera-increased blood pressure. Ineffective.  Clonidine- Severe drowsiness Mirtazapine-low energy Wellbutrin XL-helpful for mood at 150 mg daily. Had increased anxiety at 300 mg dose. Trintellix-Has been helpful for mood and anxiety. Prozac  PHQ2-9     Office Visit from 12/02/2017 in [facility]  PHQ-2 Total Score 0       Review of Systems:  Review of Systems   Cardiovascular: Negative for palpitations.  Musculoskeletal: Negative for gait problem.  Neurological: Negative for tremors.  Psychiatric/Behavioral:       Please refer to HPI    Medications: I have reviewed the patient's current medications.  Current Outpatient Medications  Medication Sig Dispense Refill  . albuterol (VENTOLIN HFA) 108 (90 Base) MCG/ACT inhaler Inhale 2 puffs into the lungs as needed.    Marland Kitchen. buPROPion (WELLBUTRIN XL) 150 MG 24 hr tablet Take 1 tablet (150 mg total) by mouth daily. Take one tablet by mouth each morning. 90 tablet 1  . EPINEPHrine 0.3 mg/0.3 mL IJ SOAJ injection Inject 0.3 mLs (0.3 mg total) into the muscle once. 1 Device 0  . etonogestrel-ethinyl estradiol (NUVARING) 0.12-0.015 MG/24HR vaginal ring Insert vaginally and leave in place for 3 consecutive weeks, then remove for 1 week. (Patient not taking: Reported on 03/19/2019) 3 each 3  . FLUoxetine (PROZAC) 10 MG capsule Take 1 capsule (10 mg total) by mouth daily. 90 capsule 1  . fluticasone (FLONASE) 50 MCG/ACT nasal spray Place into both nostrils daily.    [name]. [START ON 09/07/2019] Methylphenidate HCl ER, PM, (JORNAY PM) 60 MG CP24 Take 60 mg by mouth every evening. 90 capsule 0  . nitrofurantoin, macrocrystal-monohydrate, (MACROBID) 100 MG capsule Take one capsule after intercourse prn 30 capsule 3  . Prenatal Vit-Fe Fumarate-FA (PRENATAL VITAMINS) 28-0.8 MG TABS Take by mouth.    . propranolol (INDERAL) 10 MG tablet TAKE 1-2 TABLETS BY MOUTH TWICE A DAY AS NEEDED FOR ANXIETY 360 tablet 0  . rizatriptan (MAXALT) 10 MG tablet Take 1 tablet (10 mg total) by mouth as needed for migraine. May repeat in  2 hours if needed 10 tablet 0  . traZODone (DESYREL) 50 MG tablet TAKE 1 TO 2 TABLETS BY MOUTH AT BEDTIME AS NEEDED FOR INSOMNIA 180 tablet 1   No current facility-administered medications for this visit.    Medication Side Effects: Other: Sexual side effects  Allergies:  Allergies  Allergen Reactions  .  Banana Anaphylaxis  . Other Anaphylaxis    Chickpeas  . Augmentin [Amoxicillin-Pot Clavulanate] Hives  . Codeine     Other reaction(s): Vomiting  . Hydrocodone Other (See Comments)  . Penicillins Hives  . Sudafed [Pseudoephedrine Hcl]   . Tape Rash    Past Medical History:  Diagnosis Date  . ADD (attention deficit disorder)   . Allergic rhinitis   . Anxiety   . Asthma   . Depression    DR. KAUR  . Insomnia   . Migraine   . Other abnormal glucose    SEASONAL ALLERGIES  . Panic attacks   . Strabismic amblyopia of right eye     Family History  Problem Relation Age of Onset  . Healthy Mother   . Cancer Mother        BASEL CELL 40  . Breast cancer Mother 26       DCIS  . Healthy Father   . Hyperlipidemia Father   . Cancer Father        BASEL CELL 40  . ADD / ADHD Father   . Healthy Sister   . Depression Sister   . Liver cancer Maternal Grandmother   . Cancer Maternal Grandmother        COLON  . Schizophrenia Maternal Grandmother   . Dementia Maternal Grandfather     Social History   Socioeconomic History  . Marital status: Married    Spouse name: [name]  . Number of children: 0  . Years of education: 62  . Highest education level: Not on file  Occupational History  . Occupation: NURSE  Tobacco Use  . Smoking status: Never Smoker  . Smokeless tobacco: Never Used  Vaping Use  . Vaping Use: Never used  Substance and Sexual Activity  . Alcohol use: Yes    Alcohol/week: 1.0 - 2.0 standard drink    Types: 1 - 2 Cans of beer per week    Comment: occasionally  . Drug use: No  . Sexual activity: Yes    Partners: Male    Birth control/protection: Other-see comments    Comment: Nuvaring  Other Topics Concern  . Not on file  Social History Narrative  . Not on file   Social Determinants of Health   Financial Resource Strain:   . Difficulty of Paying Living Expenses:   Food Insecurity:   . Worried About Programme researcher, broadcasting/film/video in the Last Year:   . Garment/textile technologist in the Last Year:   Transportation Needs:   . Freight forwarder (Medical):   Marland Kitchen Lack of Transportation (Non-Medical):   Physical Activity:   . Days of Exercise per Week:   . Minutes of Exercise per Session:   Stress:   . Feeling of Stress :   Social Connections:   . Frequency of Communication with Friends and Family:   . Frequency of Social Gatherings with Friends and Family:   . Attends Religious Services:   . Active Member of Clubs or Organizations:   . Attends Banker Meetings:   Marland Kitchen Marital Status:   Intimate Partner Violence:   . Fear of  Current or Ex-Partner:   . Emotionally Abused:   Marland Kitchen Physically Abused:   . Sexually Abused:     Past Medical History, Surgical history, Social history, and Family history were reviewed and updated as appropriate.   Please see review of systems for further details on the patient's review from today.   Objective:   Physical Exam:  BP (!) 152/93   Pulse (!) 106   Physical Exam Constitutional:      General: She is not in acute distress. Musculoskeletal:        General: No deformity.  Neurological:     Mental Status: She is alert and oriented to person, place, and time.     Coordination: Coordination normal.  Psychiatric:        Attention and Perception: Attention and perception normal. She does not perceive auditory or visual hallucinations.        Mood and Affect: Mood normal. Mood is not anxious or depressed. Affect is not labile, blunt, angry or inappropriate.        Speech: Speech normal.        Behavior: Behavior normal.        Thought Content: Thought content normal. Thought content is not paranoid or delusional. Thought content does not include homicidal or suicidal ideation. Thought content does not include homicidal or suicidal plan.        Cognition and Memory: Cognition and memory normal.        Judgment: Judgment normal.     Comments: Insight intact     Lab Review:     Component Value Date/Time    NA 134 (L) 10/23/2015 0146   K 3.3 (L) 10/23/2015 0146   CL 103 10/23/2015 0146   CO2 23 10/23/2015 0146   GLUCOSE 125 (H) 10/23/2015 0146   BUN 11 10/23/2015 0146   CREATININE 0.90 10/23/2015 0146   CALCIUM 9.4 10/23/2015 0146   GFRNONAA >60 10/23/2015 0146   GFRAA >60 10/23/2015 0146       Component Value Date/Time   WBC 7.1 04/05/2017 1206   WBC 9.2 10/23/2015 0146   RBC 4.87 04/05/2017 1206   RBC 5.42 (H) 10/23/2015 0146   HGB 14.3 04/05/2017 1206   HCT 43.0 04/05/2017 1206   PLT 291 04/05/2017 1206   MCV 88 04/05/2017 1206   MCH 29.4 04/05/2017 1206   MCH 29.6 10/23/2015 0146   MCHC 33.3 04/05/2017 1206   MCHC 34.9 10/23/2015 0146   RDW 12.8 04/05/2017 1206   LYMPHSABS 2.9 04/05/2017 1206   EOSABS 0.3 04/05/2017 1206   BASOSABS 0.0 04/05/2017 1206    No results found for: POCLITH, LITHIUM   No results found for: PHENYTOIN, PHENOBARB, VALPROATE, CBMZ   .res Assessment: Plan:   Discussed that Prozac can cause sexual side effects, to include diminished sexual interest.  Discussed alternatives to Prozac, such as Trintellix and Viibryd, which have lower risk of sexual side effects.  Discussed that it appears that these medications may not be covered under patient's insurance plan.  Discussed that sexual side effects are typically dose related and may improve if patient took Prozac 10 mg every other day.  Patient reports that she will consider treatment options and contact office if she would like to restart Trintellix and stop Prozac due to sexual side effects.  She reports that she may also consider every other day dosing of Prozac. Continue Wellbutrin XL 150 mg daily for depression. Continue Jornay 60 mg po q evening for attention deficit disorder. Continue  propranolol as needed for anxiety. Recommend continuing psychotherapy with Elio Forget, [facility]. Patient to follow-up in 3 months or sooner if clinically indicated. Patient advised to contact office with any  questions, adverse effects, or acute worsening in signs and symptoms.  [name] was seen today for follow-up.  Diagnoses and all orders for this visit:  Recurrent major depressive disorder, in full remission (HCC) -     buPROPion (WELLBUTRIN XL) 150 MG 24 hr tablet; Take 1 tablet (150 mg total) by mouth daily. Take one tablet by mouth each morning. -     FLUoxetine (PROZAC) 10 MG capsule; Take 1 capsule (10 mg total) by mouth daily.  Attention deficit hyperactivity disorder (ADHD), combined type -     Discontinue: Methylphenidate HCl ER, PM, (JORNAY PM) 60 MG CP24; Take 60 mg by mouth every evening. -     Methylphenidate HCl ER, PM, (JORNAY PM) 60 MG CP24; Take 60 mg by mouth every evening.  Anxiety -     propranolol (INDERAL) 10 MG tablet; TAKE 1-2 TABLETS BY MOUTH TWICE A DAY AS NEEDED FOR ANXIETY -     FLUoxetine (PROZAC) 10 MG capsule; Take 1 capsule (10 mg total) by mouth daily.     Please see After Visit Summary for patient specific instructions.  Future Appointments  Date Time Provider Department Center  09/27/2019 10:00 AM Waldron Session, [facility] None  11/29/2019 10:00 AM Corie Chiquito, [facility]NP CP-[facility] None    No orders of the defined types were placed in this encounter.   -------------------------------

## 2019-08-30 ENCOUNTER — Encounter: Payer: Self-pay | Admitting: Family Medicine

## 2019-09-03 ENCOUNTER — Ambulatory Visit: Payer: 59 | Admitting: Mental Health

## 2019-09-06 NOTE — Telephone Encounter (Signed)
Call patient and she scheduled appointment with Debra Garza on 09/11/2019 @ 11:20 AM.

## 2019-09-10 NOTE — Progress Notes (Signed)
Established patient visit   Patient: Debra Garza   DOB: 1981-01-12   39 y.o. Female  MRN: [id number] Visit Date: 09/11/2019  Today's healthcare provider: Trey Sailors, PA-C   Chief Complaint  Patient presents with  . Hearing Loss  I,Porsha C McClurkin,acting as a scribe for [employer], PA-C.,have documented all relevant documentation on the behalf of Trey Sailors, PA-C,as directed by  Trey Sailors, PA-C while in the presence of Trey Sailors, PA-C.  Subjective    HPI  Hearing Loss Patient reports that she has hearing loss in her left ear only for about 1 year now. She reports a year ago she had an ear infection that caused hearing loss at the time. She does not currently have ear pain or drainage. Patient states that she could barely hear when someone is talking to her. She reports that when she got her COVID vaccine she had popping in her left ear and some hearing loss. No history of exposure to loud noises - she currently works in dialysis and she reports a louder noise levels than usual.    Hearing Screening   Method: Audiometry   125Hz  250Hz  500Hz  1000Hz  2000Hz  3000Hz  4000Hz  6000Hz  8000Hz   Right ear:   Pass Pass Pass  Pass    Left ear:   Fail Fail Fail  Fail         Medications: Outpatient Medications Prior to Visit  Medication Sig  . albuterol (VENTOLIN HFA) 108 (90 Base) MCG/ACT inhaler Inhale 2 puffs into the lungs as needed.  buPROPion (WELLBUTRIN XL) 150 MG 24 hr tablet Take 1 tablet (150 mg total) by mouth daily. Take one tablet by mouth each morning.  EPINEPHrine 0.3 mg/0.3 mL IJ SOAJ injection Inject 0.3 mLs (0.3 mg total) into the muscle once.  . etonogestrel-ethinyl estradiol (NUVARING) 0.12-0.015 MG/24HR vaginal ring Insert vaginally and leave in place for 3 consecutive weeks, then remove for 1 week. (Patient not taking: Reported on 03/19/2019)  . FLUoxetine (PROZAC) 10 MG capsule Take 1 capsule (10 mg total) by mouth daily.  .  fluticasone (FLONASE) 50 MCG/ACT nasal spray Place into both nostrils daily.  . Methylphenidate HCl ER, PM, (JORNAY PM) 60 MG CP24 Take 60 mg by mouth every evening.  . nitrofurantoin, macrocrystal-monohydrate, (MACROBID) 100 MG capsule Take one capsule after intercourse prn  . Prenatal Vit-Fe Fumarate-FA (PRENATAL VITAMINS) 28-0.8 MG TABS Take by mouth.  . propranolol (INDERAL) 10 MG tablet TAKE 1-2 TABLETS BY MOUTH TWICE A DAY AS NEEDED FOR ANXIETY  . rizatriptan (MAXALT) 10 MG tablet Take 1 tablet (10 mg total) by mouth as needed for migraine. May repeat in 2 hours if needed  . traZODone (DESYREL) 50 MG tablet TAKE 1 TO 2 TABLETS BY MOUTH AT BEDTIME AS NEEDED FOR INSOMNIA   No facility-administered medications prior to visit.    Review of Systems    Objective    BP 118/68 (BP Location: Right Arm, Patient Position: Sitting, Cuff Size: Normal)   Pulse 97   Temp 98.4 F (36.9 C) (Oral)   Wt 113 lb 6.4 oz (51.4 kg)   SpO2 97%   BMI 20.09 kg/m    Physical Exam Constitutional:      Appearance: Normal appearance.  HENT:     Right Ear: Tympanic membrane and ear canal normal. There is no impacted cerumen.     Left Ear: Tympanic membrane and ear canal normal. There is no impacted cerumen.  Pulmonary:     Effort: Pulmonary effort is normal. No respiratory distress.  Neurological:     Mental Status: She is alert.       No results found for any visits on 09/11/19.  Assessment & Plan    1. Hearing loss of left ear, unspecified hearing loss type  Refer to ENT, some mild hearing loss on test. Counseled on various causes of hearing loss. Counseled on treatments for hearing loss and protective measures.   - Ambulatory referral to ENT   No follow-ups on file.      ITrey Sailors, PA-C, have reviewed all documentation for this visit. The documentation on 09/11/19 for the exam, diagnosis, procedures, and orders are all accurate and complete.  The entirety of the information  documented in the History of Present Illness, Review of Systems and Physical Exam were personally obtained by me. Portions of this information were initially documented by [facility] and reviewed by me for thoroughness and accuracy.   I spent 20 minutes dedicated to the care of this patient on the date of this encounter to include pre-visit review of records, face-to-face time with the patient discussing hearing loss, hearing test results, and post visit ordering of testing.    Maryella Shivers  [facility] [phone] (phone) [phone] (fax)  [facility]

## 2019-09-11 ENCOUNTER — Encounter: Payer: Self-pay | Admitting: Physician Assistant

## 2019-09-11 ENCOUNTER — Ambulatory Visit: Payer: Managed Care, Other (non HMO) | Admitting: Physician Assistant

## 2019-09-11 ENCOUNTER — Other Ambulatory Visit: Payer: Self-pay

## 2019-09-11 VITALS — BP 118/68 | HR 97 | Temp 98.4°F | Wt 113.4 lb

## 2019-09-11 DIAGNOSIS — H9192 Unspecified hearing loss, left ear: Secondary | ICD-10-CM

## 2019-09-11 NOTE — Patient Instructions (Signed)
Hearing Loss Hearing loss is a partial or total loss of the ability to hear. This can be temporary or permanent, and it can happen in one or both ears. Medical care is necessary to treat hearing loss properly and to prevent the condition from getting worse. Your hearing may partially or completely come back, depending on what caused your hearing loss and how severe it is. In some cases, hearing loss is permanent. What are the causes? Common causes of hearing loss include:  Too much wax in the ear canal.  Infection of the ear canal or middle ear.  Fluid in the middle ear.  Injury to the ear or surrounding area.  An object stuck in the ear.  A history of prolonged exposure to loud sounds, such as music. Less common causes of hearing loss include:  Tumors in the ear.  Viral or bacterial infections, such as meningitis.  A hole in the eardrum (perforated eardrum).  Problems with the hearing nerve that sends signals between the brain and the ear.  Certain medicines. What are the signs or symptoms? Symptoms of this condition may include:  Difficulty telling the difference between sounds.  Difficulty following a conversation when there is background noise.  Lack of response to sounds in your environment. This may be most noticeable when you do not respond to startling sounds.  Needing to turn up the volume on the television, radio, or other devices.  Ringing in the ears.  Dizziness. How is this diagnosed? This condition is diagnosed based on:  A physical exam.  A hearing test (audiometry). The audiometry test will be performed by a hearing specialist (audiologist). You may also be referred to an ear, nose, and throat (ENT) specialist (otolaryngologist). How is this treated? Treatment for hearing loss may include:  Ear wax removal.  Medicines to treat or prevent infection (antibiotics).  Medicines to reduce inflammation (corticosteroids).  Hearing aids for hearing  loss related to nerve damage. Follow these instructions at home:  If you were prescribed an antibiotic medicine, take it as told by your health care provider. Do not stop taking the antibiotic even if you start to feel better.  Take over-the-counter and prescription medicines only as told by your health care provider.  Avoid loud noises.  Return to your normal activities as told by your health care provider. Ask your health care provider what activities are safe for you.  Keep all follow-up visits as told by your health care provider. This is important. Contact a health care provider if:  You feel dizzy.  You develop new symptoms.  You vomit or feel nauseous.  You have a fever. Get help right away if:  You develop sudden changes in your vision.  You have severe ear pain.  You have new or increased weakness.  You have a severe headache. Summary  Hearing loss is a decreased ability to hear sounds around you. It can be temporary or permanent.  Treatment will depend on the cause of your hearing loss. It may include ear wax removal, medicines, or a hearing aid.  Your hearing may partially or completely come back, depending on what caused your hearing loss and how severe it is.  Keep all follow-up visits as told by your health care provider. This is important. This information is not intended to replace advice given to you by your health care provider. Make sure you discuss any questions you have with your health care provider. Document Revised: 10/11/2017 Document Reviewed: 10/11/2017 Elsevier Patient Education    2020 [employer]  

## 2019-09-27 ENCOUNTER — Ambulatory Visit: Payer: 59 | Admitting: Mental Health

## 2019-10-18 ENCOUNTER — Telehealth: Payer: Self-pay

## 2019-10-18 MED ORDER — NITROFURANTOIN MONOHYD MACRO 100 MG PO CAPS: ORAL_CAPSULE | ORAL | 3 refills | Status: DC

## 2019-10-18 NOTE — Telephone Encounter (Signed)
[facility] faxed refill request for nitrofurantoin 100mg  #30 c 2rf. Take one capsule by mouth after intercourse as needed.

## 2019-10-18 NOTE — Telephone Encounter (Signed)
That is ok- can you approve and return or do I need to do something with it?

## 2019-11-29 ENCOUNTER — Ambulatory Visit: Payer: 59 | Admitting: Psychiatry

## 2019-12-07 ENCOUNTER — Ambulatory Visit: Payer: Self-pay | Admitting: Advanced Practice Midwife

## 2019-12-12 ENCOUNTER — Ambulatory Visit (INDEPENDENT_AMBULATORY_CARE_PROVIDER_SITE_OTHER): Payer: Managed Care, Other (non HMO) | Admitting: Advanced Practice Midwife

## 2019-12-12 ENCOUNTER — Other Ambulatory Visit: Payer: Self-pay

## 2019-12-12 ENCOUNTER — Encounter: Payer: Self-pay | Admitting: Advanced Practice Midwife

## 2019-12-12 VITALS — BP 120/80 | Ht 63.0 in | Wt 114.0 lb

## 2019-12-12 DIAGNOSIS — Z0000 Encounter for general adult medical examination without abnormal findings: Secondary | ICD-10-CM | POA: Diagnosis not present

## 2019-12-12 DIAGNOSIS — Z3044 Encounter for surveillance of vaginal ring hormonal contraceptive device: Secondary | ICD-10-CM

## 2019-12-12 MED ORDER — ETONOGESTREL-ETHINYL ESTRADIOL 0.12-0.015 MG/24HR VA RING: VAGINAL_RING | VAGINAL | 3 refills | Status: DC

## 2019-12-12 NOTE — Patient Instructions (Signed)
Health Maintenance, Female Adopting a healthy lifestyle and getting preventive care are important in promoting health and wellness. Ask your health care provider about:  The right schedule for you to have regular tests and exams.  Things you can do on your own to prevent diseases and keep yourself healthy. What should I know about diet, weight, and exercise? Eat a healthy diet   Eat a diet that includes plenty of vegetables, fruits, low-fat dairy products, and lean protein.  Do not eat a lot of foods that are high in solid fats, added sugars, or sodium. Maintain a healthy weight Body mass index (BMI) is used to identify weight problems. It estimates body fat based on height and weight. Your health care provider can help determine your BMI and help you achieve or maintain a healthy weight. Get regular exercise Get regular exercise. This is one of the most important things you can do for your health. Most adults should:  Exercise for at least 150 minutes each week. The exercise should increase your heart rate and make you sweat (moderate-intensity exercise).  Do strengthening exercises at least twice a week. This is in addition to the moderate-intensity exercise.  Spend less time sitting. Even light physical activity can be beneficial. Watch cholesterol and blood lipids Have your blood tested for lipids and cholesterol at 37 years of age, then have this test every 5 years. Have your cholesterol levels checked more often if:  Your lipid or cholesterol levels are high.  You are older than 37 years of age.  You are at high risk for heart disease. What should I know about cancer screening? Depending on your health history and family history, you may need to have cancer screening at various ages. This may include screening for:  Breast cancer.  Cervical cancer.  Colorectal cancer.  Skin cancer.  Lung cancer. What should I know about heart disease, diabetes, and high blood  pressure? Blood pressure and heart disease  High blood pressure causes heart disease and increases the risk of stroke. This is more likely to develop in people who have high blood pressure readings, are of African descent, or are overweight.  Have your blood pressure checked: ? Every 3-5 years if you are 18-39 years of age. ? Every year if you are 40 years old or older. Diabetes Have regular diabetes screenings. This checks your fasting blood sugar level. Have the screening done:  Once every three years after age 40 if you are at a normal weight and have a low risk for diabetes.  More often and at a younger age if you are overweight or have a high risk for diabetes. What should I know about preventing infection? Hepatitis B If you have a higher risk for hepatitis B, you should be screened for this virus. Talk with your health care provider to find out if you are at risk for hepatitis B infection. Hepatitis C Testing is recommended for:  Everyone born from 1945 through 1965.  Anyone with known risk factors for hepatitis C. Sexually transmitted infections (STIs)  Get screened for STIs, including gonorrhea and chlamydia, if: ? You are sexually active and are younger than 37 years of age. ? You are older than 37 years of age and your health care provider tells you that you are at risk for this type of infection. ? Your sexual activity has changed since you were last screened, and you are at increased risk for chlamydia or gonorrhea. Ask your health care provider if   you are at risk.  Ask your health care provider about whether you are at high risk for HIV. Your health care provider may recommend a prescription medicine to help prevent HIV infection. If you choose to take medicine to prevent HIV, you should first get tested for HIV. You should then be tested every 3 months for as long as you are taking the medicine. Pregnancy  If you are about to stop having your period (premenopausal) and  you may become pregnant, seek counseling before you get pregnant.  Take 400 to 800 micrograms (mcg) of folic acid every day if you become pregnant.  Ask for birth control (contraception) if you want to prevent pregnancy. Osteoporosis and menopause Osteoporosis is a disease in which the bones lose minerals and strength with aging. This can result in bone fractures. If you are 65 years old or older, or if you are at risk for osteoporosis and fractures, ask your health care provider if you should:  Be screened for bone loss.  Take a calcium or vitamin D supplement to lower your risk of fractures.  Be given hormone replacement therapy (HRT) to treat symptoms of menopause. Follow these instructions at home: Lifestyle  Do not use any products that contain nicotine or tobacco, such as cigarettes, e-cigarettes, and chewing tobacco. If you need help quitting, ask your health care provider.  Do not use street drugs.  Do not share needles.  Ask your health care provider for help if you need support or information about quitting drugs. Alcohol use  Do not drink alcohol if: ? Your health care provider tells you not to drink. ? You are pregnant, may be pregnant, or are planning to become pregnant.  If you drink alcohol: ? Limit how much you use to 0-1 drink a day. ? Limit intake if you are breastfeeding.  Be aware of how much alcohol is in your drink. In the [place] one drink equals one 12 oz bottle of beer (355 mL), one 5 oz glass of wine (148 mL), or one 1 oz glass of hard liquor (44 mL). General instructions  Schedule regular health, dental, and eye exams.  Stay current with your vaccines.  Tell your health care provider if: ? You often feel depressed. ? You have ever been abused or do not feel safe at home. Summary  Adopting a healthy lifestyle and getting preventive care are important in promoting health and wellness.  Follow your health care provider's instructions about healthy  diet, exercising, and getting tested or screened for diseases.  Follow your health care provider's instructions on monitoring your cholesterol and blood pressure. This information is not intended to replace advice given to you by your health care provider. Make sure you discuss any questions you have with your health care provider. Document Revised: 01/04/2018 Document Reviewed: 01/04/2018 Elsevier Patient Education  2020 Elsevier Inc[employer]  

## 2019-12-13 ENCOUNTER — Encounter: Payer: Self-pay | Admitting: Advanced Practice Midwife

## 2019-12-13 NOTE — Progress Notes (Signed)
Gynecology Annual Exam  PCP: Tamsen Roershrismon, Dennis E, [place]  Chief Complaint:  Chief Complaint  Patient presents with  . Gynecologic Exam  . Menstrual Problem    History of Present Illness: Patient is a 39 y.o. G1P0010 presents for annual exam. She is accompanied by her husband. The patient has complaint today of irregular bleeding since the beginning of October when she had a therapeutic abortion due to medications she is taking that are contraindicated in pregnancy. At that time she started taking progesterone only pills for birth control. She admits missing a couple of doses and also taking the pills at different times during the day. We discussed the nature of the POP- must be taken at the same time each day and never miss a dose for it to be effective as birth control and also for regulation of bleeding. She is trying to wean off her contraindicated medications in the hopes of conception in the next 6-8 months. She would like to go back on NuvaRing previously prescribed by Lukeolleen last year. 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  LMP: No LMP recorded. Average Interval: Prior to recent TAB regular, 28 days Duration of flow: 5 days Heavy Menses: no Clots: no Intermenstrual Bleeding: no Postcoital Bleeding: no Dysmenorrhea: no  Since her recent TAB she has had light bleeding off and on for a few days to 2 weeks at a time.   The patient is sexually active. She currently uses oral progesterone-only contraceptive for contraception. She denies dyspareunia.  The patient does perform self breast exams.  There is no notable family history of breast or ovarian cancer in her family. Her mom was diagnosed with breast cancer in her 4560s.  The patient wears seatbelts: yes.   The patient has regular exercise: she works out at the gym 2-3 days per week. She admits healthy diet. She denies adequate hydration and drinks mostly kombuch and other tea including  black tea. She only sleeps 4-5 hours per night.    The patient denies current symptoms of depression.    Review of Systems: Review of Systems  Constitutional: Negative for chills and fever.  HENT: Negative for congestion, ear discharge, ear pain, hearing loss, sinus pain and sore throat.   Eyes: Negative for blurred vision and double vision.  Respiratory: Negative for cough, shortness of breath and wheezing.   Cardiovascular: Negative for chest pain, palpitations and leg swelling.  Gastrointestinal: Negative for abdominal pain, blood in stool, constipation, diarrhea, heartburn, melena, nausea and vomiting.  Genitourinary: Negative for dysuria, flank pain, frequency, hematuria and urgency.       Positive for irregular vaginal bleeding  Musculoskeletal: Negative for back pain, joint pain and myalgias.  Skin: Negative for itching and rash.  Neurological: Negative for dizziness, tingling, tremors, sensory change, speech change, focal weakness, seizures, loss of consciousness, weakness and headaches.  Endo/Heme/Allergies: Negative for environmental allergies. Does not bruise/bleed easily.  Psychiatric/Behavioral: Negative for depression, hallucinations, memory loss, substance abuse and suicidal ideas. The patient is not nervous/anxious and does not have insomnia.     Past Medical History:  Patient Active Problem List   Diagnosis Date Noted  . S/P eye surgery, follow-up exam 01/12/2018  . Attention deficit hyperactivity disorder, combined type 11/09/2017  . Anxiety 11/09/2017  . Insomnia 11/09/2017  . Intermittent alternating esotropia 10/12/2017  . Diplopia 10/04/2017  . Adjustment disorder with depressed mood 12/16/2006    ASSESSMENT: Stable. Continue  medication and monitor for changes.   . Attention deficit hyperactivity disorder (ADHD), predominantly inattentive type 10/29/2005    ASSESSMENT: No problem coming off the Clonidine. Has decided to change school workload to reduce problems.  Will try to find a psychiatrist to treat adult ADD. PREVENTIVE COUNSELING: The patient was counseled. RETURN VISIT: Patient is to return after: outside consultation completed. Patient is to return on a prn basis. Electronically Signed by: Dortha Kern, PA-C on Friday, March 29, 2008 10:15 am   . Acute onset aura migraine 10/29/2005    Past Surgical History:  Past Surgical History:  Procedure Laterality Date  . STRABISMUS SURGERY Bilateral 01/04/2018    Gynecologic History:  No LMP recorded. Contraception: oral progesterone-only contraceptive Last Pap: 1 year ago Results were:  no abnormalities    Obstetric History: G1P0010  Family History:  Family History  Problem Relation Age of Onset  . Healthy Mother   . Cancer Mother        BASEL CELL 40  . Breast cancer Mother 73       DCIS  . Healthy Father   . Hyperlipidemia Father   . Cancer Father        BASEL CELL 40  . ADD / ADHD Father   . Healthy Sister   . Depression Sister   . Liver cancer Maternal Grandmother   . Cancer Maternal Grandmother        COLON  . Schizophrenia Maternal Grandmother   . Dementia Maternal Grandfather     Social History:  Social History   Socioeconomic History  . Marital status: Married    Spouse name: [name]  . Number of children: 0  . Years of education: 50  . Highest education level: Not on file  Occupational History  . Occupation: NURSE  Tobacco Use  . Smoking status: Never Smoker  . Smokeless tobacco: Never Used  Vaping Use  . Vaping Use: Never used  Substance and Sexual Activity  . Alcohol use: Yes    Alcohol/week: 1.0 - 2.0 standard drink    Types: 1 - 2 Cans of beer per week    Comment: occasionally  . Drug use: No  . Sexual activity: Yes    Partners: Male    Birth control/protection: Pill    Comment: Nuvaring  Other Topics Concern  . Not on file  Social History Narrative  . Not on file   Social Determinants of Health   Financial Resource Strain:   . Difficulty  of Paying Living Expenses: Not on file  Food Insecurity:   . Worried About Programme researcher, broadcasting/film/video in the Last Year: Not on file  . Ran Out of Food in the Last Year: Not on file  Transportation Needs:   . Lack of Transportation (Medical): Not on file  . Lack of Transportation (Non-Medical): Not on file  Physical Activity:   . Days of Exercise per Week: Not on file  . Minutes of Exercise per Session: Not on file  Stress:   . Feeling of Stress : Not on file  Social Connections:   . Frequency of Communication with Friends and Family: Not on file  . Frequency of Social Gatherings with Friends and Family: Not on file  . Attends Religious Services: Not on file  . Active Member of Clubs or Organizations: Not on file  . Attends Banker Meetings: Not on file  . Marital Status: Not on file  Intimate Partner Violence:   . Fear of Current  or Ex-Partner: Not on file  . Emotionally Abused: Not on file  . Physically Abused: Not on file  . Sexually Abused: Not on file    Allergies:  Allergies  Allergen Reactions  . Banana Anaphylaxis  . Other Anaphylaxis    Chickpeas  . Augmentin [Amoxicillin-Pot Clavulanate] Hives  . Codeine     Other reaction(s): Vomiting  . Hydrocodone Other (See Comments)  . Penicillins Hives  . Sudafed [Pseudoephedrine Hcl]   . Tape Rash    Medications: Prior to Admission medications   Medication Sig Start Date End Date Taking? Authorizing Provider  albuterol (VENTOLIN HFA) 108 (90 Base) MCG/ACT inhaler Inhale 2 puffs into the lungs as needed. 06/29/15  Yes [provider]  buPROPion (WELLBUTRIN XL) 150 MG 24 hr tablet Take 1 tablet (150 mg total) by mouth daily. Take one tablet by mouth each morning. 08/28/19 02/24/20 Yes Corie Chiquito, PMHNP  EPINEPHrine 0.3 mg/0.3 mL IJ SOAJ injection Inject 0.3 mLs (0.3 mg total) into the muscle once. 06/13/15  Yes Chrismon, Jodell Cipro, PA  fluticasone (FLONASE) 50 MCG/ACT nasal spray Place into both nostrils  daily.   Yes [provider]  Methylphenidate HCl ER, PM, (JORNAY PM) 60 MG CP24 Take 60 mg by mouth every evening. 09/07/19  Yes Corie Chiquito, PMHNP  Prenatal Vit-Fe Fumarate-FA (PRENATAL VITAMINS) 28-0.8 MG TABS Take by mouth.   Yes [provider]  propranolol (INDERAL) 10 MG tablet TAKE 1-2 TABLETS BY MOUTH TWICE A DAY AS NEEDED FOR ANXIETY 08/28/19  Yes Corie Chiquito, PMHNP  rizatriptan (MAXALT) 10 MG tablet Take 1 tablet (10 mg total) by mouth as needed for migraine. May repeat in 2 hours if needed 11/11/17  Yes Chrismon, Jodell Cipro, PA  etonogestrel-ethinyl estradiol (NUVARING) 0.12-0.015 MG/24HR vaginal ring Insert vaginally and leave in place for 3 consecutive weeks, then remove for 1 week. 12/12/19   Tresea Mall, CNM  FLUoxetine (PROZAC) 10 MG capsule Take 1 capsule (10 mg total) by mouth daily. 08/28/19 11/26/19  Corie Chiquito, PMHNP    Physical Exam Vitals: Blood pressure 120/80, height 5\' 3"  (1.6 m), weight 114 lb (51.7 kg).  General: NAD HEENT: normocephalic, anicteric Thyroid: no enlargement, no palpable nodules Pulmonary: No increased work of breathing, CTAB Cardiovascular: RRR, distal pulses 2+ Breast: Breast symmetrical, no tenderness, no palpable nodules or masses, no skin or nipple retraction present, no nipple discharge.  No axillary or supraclavicular lymphadenopathy. Abdomen: NABS, soft, non-tender, non-distended.  Umbilicus without lesions.  No hepatomegaly, splenomegaly or masses palpable. No evidence of hernia  Genitourinary: deferred for no concerns/PAP interval Extremities: no edema, erythema, or tenderness Neurologic: Grossly intact Psychiatric: mood appropriate, affect full    Assessment: 39 y.o. G1P0010 routine annual exam  Plan: Problem List Items Addressed This Visit    None    Visit Diagnoses    Well woman exam without gynecological exam    -  Primary   Encounter for surveillance of vaginal ring hormonal contraceptive device        Relevant Medications   etonogestrel-ethinyl estradiol (NUVARING) 0.12-0.015 MG/24HR vaginal ring      1) Continue healthy lifestyle improvements- increase hydration and hours of sleep, weaning off psych medications as able prior to conception  2) STI screening  was not offered and therefore not obtained  3) ASCCP guidelines and rationale discussed.  Patient opts for every 3 years screening interval  4) Contraception - the patient is currently using  oral progesterone-only contraceptive.  She is interested in changing to  NuvaRing  5) Routine healthcare maintenance including cholesterol, diabetes screening discussed Declines  6) Return in about 1 year (around 12/11/2020) for annual established gyn.   Tresea Mall, CNM [facility] 12/13/2019, 9:30 AM

## 2020-01-03 ENCOUNTER — Encounter: Payer: Self-pay | Admitting: Psychiatry

## 2020-01-03 ENCOUNTER — Other Ambulatory Visit: Payer: Self-pay

## 2020-01-03 ENCOUNTER — Ambulatory Visit (INDEPENDENT_AMBULATORY_CARE_PROVIDER_SITE_OTHER): Payer: 59 | Admitting: Psychiatry

## 2020-01-03 DIAGNOSIS — F902 Attention-deficit hyperactivity disorder, combined type: Secondary | ICD-10-CM | POA: Diagnosis not present

## 2020-01-03 DIAGNOSIS — F3342 Major depressive disorder, recurrent, in full remission: Secondary | ICD-10-CM | POA: Diagnosis not present

## 2020-01-03 MED ORDER — BUPROPION HCL ER (SR) 200 MG PO TB12: 200.0000 mg | ORAL_TABLET | Freq: Every day | ORAL | 1 refills | Status: DC

## 2020-01-03 MED ORDER — JORNAY PM 40 MG PO CP24: 40.0000 mg | ORAL_CAPSULE | Freq: Every evening | ORAL | 0 refills | Status: DC

## 2020-01-03 MED ORDER — JORNAY PM 20 MG PO CP24: 20.0000 mg | ORAL_CAPSULE | Freq: Every evening | ORAL | 0 refills | Status: DC

## 2020-01-03 NOTE — Progress Notes (Signed)
Debra Garza [id number] Jul 17, 1980 39 y.o.  Subjective:   Patient ID:  Debra Garza is a 39 y.o. (DOB 08/18/1980) female.  Chief Complaint:  Chief Complaint  Patient presents with  . Depression  . ADHD    HPI Debra Garza presents to the office today for follow-up of ADHD, anxiety, and depression.  She is accompanied by her husband. She reports that she feels like her ADHD medication has "plateaued." He reports that he does not think she is having any benefit with [place]. Has been having difficulty getting up for work. She reports that concentration has been "fine" at work unless there are unexpected demands. She reports sad mood. Husband reports that she has had some depression. She reports that her anxiety has been helpful. She reports that her energy and motivation have been low. Has been sleeping about 5 hours a night. Denies difficulty falling or staying asleep. Appetite has been good. Denies SI. She reports SIB about 5 weeks ago. Denies any recent self-injurious ideation.   Has not been taking Propranolol.   Goes into work at 5 am and has long days. They try to prepare the night before as much as possible. Anticipates changing clinics later this month.   Seeing a new therapist, Myrtie Soman, PhD.   Past medication trials: Vyvanse-effective Dexedrine-effective Adderall- mood swings, tearfulness and irritability Methylphenidate-effective for only brief periods of time Concerta-increased blood pressure Strattera-increased blood pressure. Ineffective.  Clonidine- Severe drowsiness Mirtazapine-low energy Wellbutrin XL-helpful for mood at 150 mg daily. Had increased anxiety at 300 mg dose. Trintellix-Has been helpful for mood and anxiety. Prozac  PHQ2-9   Flowsheet Row Office Visit from 12/02/2017 in [facility]  PHQ-2 Total Score 0       Review of Systems:  Review of Systems  Cardiovascular: Negative for  palpitations.  Musculoskeletal: Negative for gait problem.  Neurological: Negative for tremors.  Psychiatric/Behavioral:       Please refer to HPI    BP is typically 140/80 and HR in the 80's-90's.   Medications: I have reviewed the patient's current medications.  Current Outpatient Medications  Medication Sig Dispense Refill  . etonogestrel-ethinyl estradiol (NUVARING) 0.12-0.015 MG/24HR vaginal ring Insert vaginally and leave in place for 3 consecutive weeks, then remove for 1 week. 3 each 3  . fluticasone (FLONASE) 50 MCG/ACT nasal spray Place into both nostrils daily.    . Prenatal Vit-Fe Fumarate-FA (PRENATAL VITAMINS) 28-0.8 MG TABS Take by mouth.    Marland Kitchen albuterol (VENTOLIN HFA) 108 (90 Base) MCG/ACT inhaler Inhale 2 puffs into the lungs as needed.    Marland Kitchen buPROPion (WELLBUTRIN SR) 200 MG 12 hr tablet Take 1 tablet (200 mg total) by mouth daily. 30 tablet 1  . CRANBERRY PO Take by mouth.    . EPINEPHrine 0.3 mg/0.3 mL IJ SOAJ injection Inject 0.3 mLs (0.3 mg total) into the muscle once. 1 Device 0  . [START ON 01/31/2020] Methylphenidate HCl ER, PM, (JORNAY PM) 20 MG CP24 Take 20 mg by mouth every evening. 30 capsule 0  . Methylphenidate HCl ER, PM, (JORNAY PM) 40 MG CP24 Take 40 mg by mouth every evening. 30 capsule 0  . nitrofurantoin (MACRODANTIN) 100 MG capsule Take 100 mg by mouth as needed.    . rizatriptan (MAXALT) 10 MG tablet Take 1 tablet (10 mg total) by mouth as needed for migraine. May repeat in 2 hours if needed 10 tablet 0   No current facility-administered medications for this visit.  Medication Side Effects: Other: Possible increased BP  Allergies:  Allergies  Allergen Reactions  . Banana Anaphylaxis  . Other Anaphylaxis    Chickpeas  . Augmentin [Amoxicillin-Pot Clavulanate] Hives  . Codeine     Other reaction(s): Vomiting  . Hydrocodone Other (See Comments)  . Penicillins Hives  . Sudafed [Pseudoephedrine Hcl]   . Tape Rash    Past Medical History:   Diagnosis Date  . ADD (attention deficit disorder)   . Allergic rhinitis   . Anxiety   . Asthma   . Depression    DR. KAUR  . Insomnia   . Migraine   . Other abnormal glucose    SEASONAL ALLERGIES  . Panic attacks   . Strabismic amblyopia of right eye     Family History  Problem Relation Age of Onset  . Healthy Mother   . Cancer Mother        BASEL CELL 40  . Breast cancer Mother 56       DCIS  . Healthy Father   . Hyperlipidemia Father   . Cancer Father        BASEL CELL 40  . ADD / ADHD Father   . Healthy Sister   . Depression Sister   . Liver cancer Maternal Grandmother   . Cancer Maternal Grandmother        COLON  . Schizophrenia Maternal Grandmother   . Dementia Maternal Grandfather     Social History   Socioeconomic History  . Marital status: Married    Spouse name: [name]  . Number of children: 0  . Years of education: 27  . Highest education level: Not on file  Occupational History  . Occupation: NURSE  Tobacco Use  . Smoking status: Never Smoker  . Smokeless tobacco: Never Used  Vaping Use  . Vaping Use: Never used  Substance and Sexual Activity  . Alcohol use: Yes    Alcohol/week: 1.0 - 2.0 standard drink    Types: 1 - 2 Cans of beer per week    Comment: occasionally  . Drug use: No  . Sexual activity: Yes    Partners: Male    Birth control/protection: Pill    Comment: Nuvaring  Other Topics Concern  . Not on file  Social History Narrative  . Not on file   Social Determinants of Health   Financial Resource Strain: Not on file  Food Insecurity: Not on file  Transportation Needs: Not on file  Physical Activity: Not on file  Stress: Not on file  Social Connections: Not on file  Intimate Partner Violence: Not on file    Past Medical History, Surgical history, Social history, and Family history were reviewed and updated as appropriate.   Please see review of systems for further details on the patient's review from today.    Objective:   Physical Exam:  There were no vitals taken for this visit.  Physical Exam Constitutional:      General: She is not in acute distress. Musculoskeletal:        General: No deformity.  Neurological:     Mental Status: She is alert and oriented to person, place, and time.     Coordination: Coordination normal.  Psychiatric:        Attention and Perception: Attention and perception normal. She does not perceive auditory or visual hallucinations.        Mood and Affect: Mood is depressed. Mood is not anxious. Affect is not labile, blunt, angry or  inappropriate.        Speech: Speech normal.        Behavior: Behavior normal.        Thought Content: Thought content normal. Thought content is not paranoid or delusional. Thought content does not include homicidal or suicidal ideation. Thought content does not include homicidal or suicidal plan.        Cognition and Memory: Cognition and memory normal.        Judgment: Judgment normal.     Comments: Insight intact     Lab Review:     Component Value Date/Time   NA 134 (L) 10/23/2015 0146   K 3.3 (L) 10/23/2015 0146   CL 103 10/23/2015 0146   CO2 23 10/23/2015 0146   GLUCOSE 125 (H) 10/23/2015 0146   BUN 11 10/23/2015 0146   CREATININE 0.90 10/23/2015 0146   CALCIUM 9.4 10/23/2015 0146   GFRNONAA >60 10/23/2015 0146   GFRAA >60 10/23/2015 0146       Component Value Date/Time   WBC 7.1 04/05/2017 1206   WBC 9.2 10/23/2015 0146   RBC 4.87 04/05/2017 1206   RBC 5.42 (H) 10/23/2015 0146   HGB 14.3 04/05/2017 1206   HCT 43.0 04/05/2017 1206   PLT 291 04/05/2017 1206   MCV 88 04/05/2017 1206   MCH 29.4 04/05/2017 1206   MCH 29.6 10/23/2015 0146   MCHC 33.3 04/05/2017 1206   MCHC 34.9 10/23/2015 0146   RDW 12.8 04/05/2017 1206   LYMPHSABS 2.9 04/05/2017 1206   EOSABS 0.3 04/05/2017 1206   BASOSABS 0.0 04/05/2017 1206    No results found for: POCLITH, LITHIUM   No results found for: PHENYTOIN, PHENOBARB,  VALPROATE, CBMZ   .res Assessment: Plan:    Pt seen for 30 minutes and time spent counseling pt and her husband regarding treatment options. Discussed option to increase Wellbutrin to improve depressive s/s. Discussed changing Wellbutrin to SR to use 200 mg dose since pt had worsening anxiety in the past with 300 mg dose and depressive s/s are not adequately controlled with 150 mg po qd.  Discussed considering Sertraline if increase in Wellbutrin is not effective or well tolerated since Sertraline is generally considered to have less potential risks during pregnancy and breastfeeding compared to other SSRI's. Will stop Prozac since pt has been taking it every other day due to limited benefit and denies any worsening s/s with dose decrease.  Discussed decreasing Jornay PM gradually since pt would like to come off stimulants prior to trying to conceive and discussed that gradually reducing dose would minimize risk of withdrawal. Will decrease Jornay to 40 mg po q evening for one month, then decrease to 20 mg po q evening for one month, and then discontinue. Pt to follow-up in 2 months or sooner if clinically indicated.  Patient advised to contact office with any questions, adverse effects, or acute worsening in signs and symptoms.  [place] was seen today for depression and adhd.  Diagnoses and all orders for this visit:  Recurrent major depressive disorder, in full remission (HCC) -     buPROPion (WELLBUTRIN SR) 200 MG 12 hr tablet; Take 1 tablet (200 mg total) by mouth daily.  Attention deficit hyperactivity disorder (ADHD), combined type -     Methylphenidate HCl ER, PM, (JORNAY PM) 40 MG CP24; Take 40 mg by mouth every evening. -     Methylphenidate HCl ER, PM, (JORNAY PM) 20 MG CP24; Take 20 mg by mouth every evening.  Please see After Visit Summary for patient specific instructions.  Future Appointments  Date Time Provider Department Center  02/26/2020 11:15 AM Corie Chiquito,  PMHNP [facility] None    No orders of the defined types were placed in this encounter.   -------------------------------

## 2020-01-26 ENCOUNTER — Other Ambulatory Visit: Payer: Self-pay | Admitting: Psychiatry

## 2020-01-26 DIAGNOSIS — F3342 Major depressive disorder, recurrent, in full remission: Secondary | ICD-10-CM

## 2020-01-31 DIAGNOSIS — F411 Generalized anxiety disorder: Secondary | ICD-10-CM | POA: Diagnosis not present

## 2020-01-31 DIAGNOSIS — F330 Major depressive disorder, recurrent, mild: Secondary | ICD-10-CM | POA: Diagnosis not present

## 2020-02-07 DIAGNOSIS — F330 Major depressive disorder, recurrent, mild: Secondary | ICD-10-CM | POA: Diagnosis not present

## 2020-02-07 DIAGNOSIS — F411 Generalized anxiety disorder: Secondary | ICD-10-CM | POA: Diagnosis not present

## 2020-02-14 DIAGNOSIS — F4320 Adjustment disorder, unspecified: Secondary | ICD-10-CM | POA: Diagnosis not present

## 2020-02-14 DIAGNOSIS — F411 Generalized anxiety disorder: Secondary | ICD-10-CM | POA: Diagnosis not present

## 2020-02-14 DIAGNOSIS — F330 Major depressive disorder, recurrent, mild: Secondary | ICD-10-CM | POA: Diagnosis not present

## 2020-02-18 ENCOUNTER — Other Ambulatory Visit: Payer: Self-pay | Admitting: Advanced Practice Midwife

## 2020-02-18 ENCOUNTER — Telehealth: Payer: Self-pay

## 2020-02-18 DIAGNOSIS — Z3044 Encounter for surveillance of vaginal ring hormonal contraceptive device: Secondary | ICD-10-CM

## 2020-02-18 MED ORDER — ETONOGESTREL-ETHINYL ESTRADIOL 0.12-0.015 MG/24HR VA RING: VAGINAL_RING | VAGINAL | 3 refills | Status: DC

## 2020-02-18 NOTE — Progress Notes (Signed)
Birth control ring reordered due to break through bleeding.

## 2020-02-18 NOTE — Telephone Encounter (Signed)
Prior authorization submitted for JORNAY PM 40 MG with [employer] ID# [id number],

## 2020-02-19 ENCOUNTER — Encounter: Payer: Self-pay | Admitting: Family Medicine

## 2020-02-19 DIAGNOSIS — F4320 Adjustment disorder, unspecified: Secondary | ICD-10-CM | POA: Diagnosis not present

## 2020-02-21 ENCOUNTER — Telehealth (INDEPENDENT_AMBULATORY_CARE_PROVIDER_SITE_OTHER): Payer: BC Managed Care – PPO | Admitting: Family Medicine

## 2020-02-21 ENCOUNTER — Encounter: Payer: Self-pay | Admitting: Family Medicine

## 2020-02-21 DIAGNOSIS — F330 Major depressive disorder, recurrent, mild: Secondary | ICD-10-CM | POA: Diagnosis not present

## 2020-02-21 DIAGNOSIS — R059 Cough, unspecified: Secondary | ICD-10-CM

## 2020-02-21 DIAGNOSIS — Z8616 Personal history of COVID-19: Secondary | ICD-10-CM

## 2020-02-21 DIAGNOSIS — F411 Generalized anxiety disorder: Secondary | ICD-10-CM | POA: Diagnosis not present

## 2020-02-21 MED ORDER — PREDNISONE 5 MG PO TABS: ORAL_TABLET | ORAL | 0 refills | Status: DC

## 2020-02-21 NOTE — Progress Notes (Signed)
MyChart Video Visit    Virtual Visit via Video Note   This visit type was conducted due to national recommendations for restrictions regarding the COVID-19 Pandemic (e.g. social distancing) in an effort to limit this patient's exposure and mitigate transmission in our community. This patient is at least at moderate risk for complications without adequate follow up. This format is felt to be most appropriate for this patient at this time. Physical exam was limited by quality of the video and audio technology used for the visit.   Patient location: home Provider location: office  I discussed the limitations of evaluation and management by telemedicine and the availability of in person appointments. The patient expressed understanding and agreed to proceed.  Patient: Debra Garza   DOB: Apr 07, 1980   39 y.o. Female  MRN: [id number] Visit Date: 02/21/2020  Today's healthcare provider: Dortha Kern, PA-C   No chief complaint on file.  Subjective    HPI   Patient is a 40 year old female who presents via video visit for symptoms of Covid-19.  She states her symptoms began on 01/28/20.  Her husband tested positive on 02/04/20.  She tested positive on 02/05/20.   She continues with symptoms and has cough that is keeping her up at night.  She has been using Delsym and Coricidin.    Past Medical History:  Diagnosis Date  . ADD (attention deficit disorder)   . Allergic rhinitis   . Anxiety   . Asthma   . Depression    DR. KAUR  . Insomnia   . Migraine   . Other abnormal glucose    SEASONAL ALLERGIES  . Panic attacks   . Strabismic amblyopia of right eye    Past Surgical History:  Procedure Laterality Date  . STRABISMUS SURGERY Bilateral 01/04/2018   Family History  Problem Relation Age of Onset  . Healthy Mother   . Cancer Mother        BASEL CELL 40  . Breast cancer Mother 47       DCIS  . Healthy Father   . Hyperlipidemia Father   . Cancer Father         BASEL CELL 40  . ADD / ADHD Father   . Healthy Sister   . Depression Sister   . Liver cancer Maternal Grandmother   . Cancer Maternal Grandmother        COLON  . Schizophrenia Maternal Grandmother   . Dementia Maternal Grandfather    Social History   Tobacco Use  . Smoking status: Never Smoker  . Smokeless tobacco: Never Used  Vaping Use  . Vaping Use: Never used  Substance Use Topics  . Alcohol use: Yes    Alcohol/week: 1.0 - 2.0 standard drink    Types: 1 - 2 Cans of beer per week    Comment: occasionally  . Drug use: No     Medications: Outpatient Medications Prior to Visit  Medication Sig  . albuterol (VENTOLIN HFA) 108 (90 Base) MCG/ACT inhaler Inhale 2 puffs into the lungs as needed.  Marland Kitchen buPROPion (WELLBUTRIN SR) 200 MG 12 hr tablet TAKE 1 TABLET BY MOUTH EVERY DAY  . CRANBERRY PO Take by mouth.  . EPINEPHrine 0.3 mg/0.3 mL IJ SOAJ injection Inject 0.3 mLs (0.3 mg total) into the muscle once.  . etonogestrel-ethinyl estradiol (NUVARING) 0.12-0.015 MG/24HR vaginal ring Insert vaginally and leave in place for 3 consecutive weeks, then remove for 1 week.  . fluticasone (FLONASE) 50 MCG/ACT  nasal spray Place into both nostrils daily.  . Methylphenidate HCl ER, PM, (JORNAY PM) 20 MG CP24 Take 20 mg by mouth every evening.  . Methylphenidate HCl ER, PM, (JORNAY PM) 40 MG CP24 Take 40 mg by mouth every evening.  . nitrofurantoin (MACRODANTIN) 100 MG capsule Take 100 mg by mouth as needed.  . Prenatal Vit-Fe Fumarate-FA (PRENATAL VITAMINS) 28-0.8 MG TABS Take by mouth.  . rizatriptan (MAXALT) 10 MG tablet Take 1 tablet (10 mg total) by mouth as needed for migraine. May repeat in 2 hours if needed   No facility-administered medications prior to visit.    Review of Systems  Constitutional: Positive for fatigue. Negative for chills, diaphoresis and fever.  HENT: Positive for congestion (head and chest), postnasal drip, rhinorrhea, sneezing and sore throat (from cough).  Negative for ear discharge, ear pain, facial swelling, sinus pressure, sinus pain and tinnitus.   Respiratory: Positive for cough, shortness of breath and wheezing. Negative for chest tightness.   Cardiovascular: Negative for chest pain.  Gastrointestinal: Negative for abdominal pain and diarrhea.  Neurological: Negative for dizziness and headaches.  Loss of taste and smell but has resolved.     Objective    There were no vitals taken for this visit.  Has a Nuvaring for birth control.  Physical Exam: WDWN female in no apparent distress.  Head: Normocephalic, atraumatic. Neck: Supple, NROM Respiratory: No apparent acute respiratory distress. Some occasional cough during visit. Psych: Normal mood and affect     Assessment & Plan     1. Cough Some PND but no sputum production now. Started with symptoms 01-28-20 and had a positive COVID test on 02-05-20 after husband tested positive. Isolated at home for 3 days and back at work now. Cough and some wheeze persists. Using Albuterol prn. Will add prednisone taper and switch Delsym to Mucinex-DM. Increase fluid intake and recheck prn. - predniSONE (DELTASONE) 5 MG tablet; Taper down by 1 tablet by mouth daily starting at 6 day 1, then, 5 day 2, 4 day 3, 3 day 4, 2 day 5 and 1 day 6. Divide dosage among meals and bedtime each day.  Dispense: 21 tablet; Refill: 0  2. Personal history of COVID-19 Diagnosed with positive test on 02-05-20. Had loss of taste with cough and dyspnea on exertion. Symptoms have diminished. Recommend prednisone taper for wheeze, cough and dyspnea. Maintain COVID restrictions. Has had 2 COVID vaccinations.   No follow-ups on file.     I discussed the assessment and treatment plan with the patient. The patient was provided an opportunity to ask questions and all were answered. The patient agreed with the plan and demonstrated an understanding of the instructions.   The patient was advised to call back or seek an  in-person evaluation if the symptoms worsen or if the condition fails to improve as anticipated.  I provided 20 minutes of non-face-to-face time during this encounter.  I, Dennis Chrismon, PA-C, have reviewed all documentation for this visit. The documentation on 02/21/20 for the exam, diagnosis, procedures, and orders are all accurate and complete.   Dortha Kern, PA-C [employer] [phone] (phone) [phone] (fax)  [facility]

## 2020-02-26 ENCOUNTER — Ambulatory Visit: Payer: 59 | Admitting: Psychiatry

## 2020-02-26 DIAGNOSIS — F4320 Adjustment disorder, unspecified: Secondary | ICD-10-CM | POA: Diagnosis not present

## 2020-02-28 DIAGNOSIS — F411 Generalized anxiety disorder: Secondary | ICD-10-CM | POA: Diagnosis not present

## 2020-02-28 DIAGNOSIS — F330 Major depressive disorder, recurrent, mild: Secondary | ICD-10-CM | POA: Diagnosis not present

## 2020-02-29 NOTE — Telephone Encounter (Signed)
Contacted [employer], they are still reviewing information for her Prior Authorization

## 2020-03-04 ENCOUNTER — Other Ambulatory Visit: Payer: Self-pay

## 2020-03-04 ENCOUNTER — Encounter: Payer: Self-pay | Admitting: Obstetrics

## 2020-03-04 ENCOUNTER — Ambulatory Visit (INDEPENDENT_AMBULATORY_CARE_PROVIDER_SITE_OTHER): Payer: BC Managed Care – PPO | Admitting: Obstetrics

## 2020-03-04 VITALS — BP 118/74 | Ht 63.0 in | Wt 112.0 lb

## 2020-03-04 DIAGNOSIS — N926 Irregular menstruation, unspecified: Secondary | ICD-10-CM

## 2020-03-04 DIAGNOSIS — Z3044 Encounter for surveillance of vaginal ring hormonal contraceptive device: Secondary | ICD-10-CM | POA: Diagnosis not present

## 2020-03-04 DIAGNOSIS — F4320 Adjustment disorder, unspecified: Secondary | ICD-10-CM | POA: Diagnosis not present

## 2020-03-05 NOTE — Telephone Encounter (Signed)
[employer] faxed again requesting further information on previous medications tried while reviewing her PA for [place] although we have sent records.   I contacted the patient to make sure she still wanted [place] to pursue a prior authorization for Jornay, according to last office note she was planning on stopping it. She reports she has been on a stimulant of some sort for 20 years and wants to see how she does without anything. She agreed to not continuing to get an approval at this time.   Shanda Bumps notified of patient's request. Patient is scheduled next month on 04/22/2020 for follow up

## 2020-03-06 DIAGNOSIS — F330 Major depressive disorder, recurrent, mild: Secondary | ICD-10-CM | POA: Diagnosis not present

## 2020-03-06 DIAGNOSIS — F411 Generalized anxiety disorder: Secondary | ICD-10-CM | POA: Diagnosis not present

## 2020-03-07 NOTE — Progress Notes (Signed)
Obstetrics & Gynecology Office Visit   Chief Complaint:  Chief Complaint  Patient presents with  . Menstrual Problem    Pt having spotting since 01/28/2020. On nuva ring    History of Present Illness: Debra Garza present s to discuss a c/o irregular vaginal spotting. She is presently using a Paediatric nurse for birth control was previously cared for by Debra Garza CNM, who changed her for a POP to the ring secondary to some intermenstrual spotting. Debra Garza also acknowledged that she has been an inconsistent pill user, so the switch to a ring was to provide a more convenient method.   She had  A medical abortion in October due to medication uses contraindicated during pregnancy. After this, she started on the POPS, but had irregular bleeding secondary to missing some pills and taking them at different times of the day. Debra Garza CNM thus switched her to the Nuva ring 12/12/2019.  In January, the patient contacted the office c/o breakthrough bleeding and requesting another ring. This was ordered for her.  Today, she is again present to discuss some ongoing irregular spotting.her spotting is light, and she is not bleeding today. 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 She is married. She relates a hx of anxiety and depressive symptoms for which she is seen regularly for counseling and has been on psychotropic medication. At an earlier appointment, she shared a desire to conceive a baby, but today shares that due to some domestic issues, a plan for conception is on hold.   Review of Systems:  Review of Systems  Constitutional: Negative.   HENT: Negative.   Eyes: Negative.   Respiratory: Negative.   Cardiovascular: Negative.   Gastrointestinal: Negative.   Genitourinary: Negative.   Musculoskeletal: Negative.   Skin: Negative.   Neurological: Negative.   Endo/Heme/Allergies: Negative.   Psychiatric/Behavioral: The patient is nervous/anxious.      Past Medical History:  Past Medical History:   Diagnosis Date  . ADD (attention deficit disorder)   . Allergic rhinitis   . Anxiety   . Asthma   . Depression    DR. KAUR  . Insomnia   . Migraine   . Other abnormal glucose    SEASONAL ALLERGIES  . Panic attacks   . Strabismic amblyopia of right eye     Past Surgical History:  Past Surgical History:  Procedure Laterality Date  . STRABISMUS SURGERY Bilateral 01/04/2018    Gynecologic History: Patient's last menstrual period was 01/28/2020.  Obstetric History: G1P0010  Family History:  Family History  Problem Relation Age of Onset  . Healthy Mother   . Cancer Mother        BASEL CELL 40  . Breast cancer Mother 79       DCIS  . Healthy Father   . Hyperlipidemia Father   . Cancer Father        BASEL CELL 40  . ADD / ADHD Father   . Healthy Sister   . Depression Sister   . Liver cancer Maternal Grandmother   . Cancer Maternal Grandmother        COLON  . Schizophrenia Maternal Grandmother   . Dementia Maternal Grandfather     Social History:  Social History   Socioeconomic History  . Marital status: Married    Spouse name: Debra Garza  . Number of children: 0  . Years of education: 39  . Highest education level: Not on file  Occupational History  . Occupation: NURSE  Tobacco Use  .  Smoking status: Never Smoker  . Smokeless tobacco: Never Used  Vaping Use  . Vaping Use: Never used  Substance and Sexual Activity  . Alcohol use: Yes    Alcohol/week: 1.0 - 2.0 standard drink    Types: 1 - 2 Cans of beer per week    Comment: occasionally  . Drug use: No  . Sexual activity: Yes    Partners: Male    Birth control/protection: Inserts    Comment: Nuvaring  Other Topics Concern  . Not on file  Social History Narrative  . Not on file   Social Determinants of Health   Financial Resource Strain: Not on file  Food Insecurity: Not on file  Transportation Needs: Not on file  Physical Activity: Not on file  Stress: Not on file  Social Connections: Not on  file  Intimate Partner Violence: Not on file    Allergies:  Allergies  Allergen Reactions  . Banana Anaphylaxis  . Other Anaphylaxis    Chickpeas  . Augmentin [Amoxicillin-Pot Clavulanate] Hives  . Codeine     Other reaction(s): Vomiting  . Hydrocodone Other (See Comments)  . Penicillins Hives  . Sudafed [Pseudoephedrine Hcl]   . Tape Rash    Medications: Prior to Admission medications   Medication Sig Start Date End Date Taking? Authorizing Provider  albuterol (VENTOLIN HFA) 108 (90 Base) MCG/ACT inhaler Inhale 2 puffs into the lungs as needed. 06/29/15   [provider]  buPROPion (WELLBUTRIN SR) 200 MG 12 hr tablet TAKE 1 TABLET BY MOUTH EVERY DAY 01/28/20   Corie Chiquito, PMHNP  CRANBERRY PO Take by mouth.    [provider]  EPINEPHrine 0.3 mg/0.3 mL IJ SOAJ injection Inject 0.3 mLs (0.3 mg total) into the muscle once. 06/13/15   Chrismon, Jodell Cipro, PA-C  etonogestrel-ethinyl estradiol (NUVARING) 0.12-0.015 MG/24HR vaginal ring Insert vaginally and leave in place for 3 consecutive weeks, then remove for 1 week. 02/18/20   Debra Garza, CNM  fluticasone (FLONASE) 50 MCG/ACT nasal spray Place into both nostrils daily.    [provider]  Methylphenidate HCl ER, PM, (JORNAY PM) 20 MG CP24 Take 20 mg by mouth every evening. 01/31/20   Corie Chiquito, PMHNP  Methylphenidate HCl ER, PM, (JORNAY PM) 40 MG CP24 Take 40 mg by mouth every evening. 01/03/20   Corie Chiquito, PMHNP  nitrofurantoin (MACRODANTIN) 100 MG capsule Take 100 mg by mouth as needed.    [provider]  predniSONE (DELTASONE) 5 MG tablet Taper down by 1 tablet by mouth daily starting at 6 day 1, then, 5 day 2, 4 day 3, 3 day 4, 2 day 5 and 1 day 6. Divide dosage among meals and bedtime each day. 02/21/20   Chrismon, Jodell Cipro, PA-C  Prenatal Vit-Fe Fumarate-FA (PRENATAL VITAMINS) 28-0.8 MG TABS Take by mouth.    [provider]  rizatriptan (MAXALT) 10 MG tablet Take 1 tablet  (10 mg total) by mouth as needed for migraine. May repeat in 2 hours if needed 11/11/17   Chrismon, Maryjean Morn    Physical Exam Vitals:  Vitals:   03/04/20 1506  BP: 118/74   Patient's last menstrual period was 01/28/2020.  Physical Exam Constitutional:      Appearance: Normal appearance. She is normal weight.  HENT:     Head: Normocephalic and atraumatic.     Nose: Nose normal.  Cardiovascular:     Rate and Rhythm: Normal rate and regular rhythm.  Pulmonary:     Effort: Pulmonary  effort is normal.     Breath sounds: Normal breath sounds.  Abdominal:     General: Bowel sounds are normal.     Palpations: Abdomen is soft.  Genitourinary:    Comments: Pelvic exam deferred- no bleeding today or c/o pelvic pain , vaginal discharge or irritation. Musculoskeletal:        General: Normal range of motion.     Cervical back: Normal range of motion and neck supple.  Skin:    General: Skin is warm and dry.  Neurological:     General: No focal deficit present.     Mental Status: She is alert and oriented to person, place, and time.  Psychiatric:        Behavior: Behavior normal.        Thought Content: Thought content normal.        Judgment: Judgment normal.     Comments: Admits to feeling anxious      Assessment: 40 y.o. G1P0010 some irregular spotting with use of the Nuva Ring   Plan: Problem List Items Addressed This Visit   None   Visit Diagnoses    Irregular menstrual bleeding    -  Primary   Encounter for surveillance of vaginal ring hormonal contraceptive device         P: Education regarding the proper use of the Nuva Ring reviewed with her. She admits to keeping the ring in beyond the three week period so as to miss  a period, and then resuming another ring. The importance of using the Ring according to instructions according to the manufacturer stressed. Likely cause of the ifrregular spotting is due to inconsistant use of her hormaonal methods and her  cycles resuming post EAB.  Discussed continuing with the next Ring or two, and then she is encouraged to contact the office should the irregular pattern contiue. We discussed a pelvic ultrasound  In that case.  This POC was also discussed with her husband via the phone as he was not able to be present for the visit due to COVID guidelines.   A total  Of 30 minutes was spent in reviewing the record, providing clinical evaluation and guidance and documentation.  Mirna Mires, CNM  03/07/2020 5:02 PM

## 2020-03-11 ENCOUNTER — Ambulatory Visit (INDEPENDENT_AMBULATORY_CARE_PROVIDER_SITE_OTHER): Payer: BC Managed Care – PPO | Admitting: Psychiatry

## 2020-03-11 ENCOUNTER — Other Ambulatory Visit: Payer: Self-pay

## 2020-03-11 ENCOUNTER — Encounter: Payer: Self-pay | Admitting: Psychiatry

## 2020-03-11 VITALS — BP 151/87 | HR 106

## 2020-03-11 DIAGNOSIS — F330 Major depressive disorder, recurrent, mild: Secondary | ICD-10-CM

## 2020-03-11 DIAGNOSIS — F902 Attention-deficit hyperactivity disorder, combined type: Secondary | ICD-10-CM | POA: Diagnosis not present

## 2020-03-11 DIAGNOSIS — F3342 Major depressive disorder, recurrent, in full remission: Secondary | ICD-10-CM

## 2020-03-11 DIAGNOSIS — F4320 Adjustment disorder, unspecified: Secondary | ICD-10-CM | POA: Diagnosis not present

## 2020-03-11 DIAGNOSIS — F419 Anxiety disorder, unspecified: Secondary | ICD-10-CM

## 2020-03-11 MED ORDER — BUPROPION HCL ER (SR) 200 MG PO TB12: 200.0000 mg | ORAL_TABLET | Freq: Every day | ORAL | 1 refills | Status: DC

## 2020-03-11 NOTE — Progress Notes (Signed)
Debra Garza [id number] Mar 11, 1980 40 y.o.  Subjective:   Patient ID:  Debra Garza is a 40 y.o. (DOB 1980/06/16) female.  Chief Complaint:  Chief Complaint  Patient presents with   Follow-up    H/o anxiety, depression, and attention deficit disorder    HPI [name] Pecinich Moshier presents to the office today for follow-up of anxiety, depression, and attention deficit disorder. She has stopped [place] PM due to insurance non-coverage. She reports that concentration and focus have been difficult and is able to focus with effort.  She reports that she would prefer not to restart a stimulant at this time and would like to see how she does off stimulants.  She reports that her mood has been fluctuating. She reports that she and her husband have been having some difficulties and are seeing an individual therapist and couples therapist. She has been seeing a trauma therapist. She is contemplating a separation.   Feels that Wellbutrin SR 200 mg qd has been helpful for her mood. She reports that she has had some depression "but I am not spiraling completely downwards." She reports that she had a severe panic attack a few weeks ago that lasted a long period. She reports that anxiety "depends on the week." She reports that her GAD is usually around a 6. Sleep has been ok. Appetite is decreased on "bad days." Energy has been ok. Motivation has been "hit or miss." Denies SI. Has had some self-injurious ideation. Denies SIB.  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 Will be working 12 hour day shifts.  Husband working from home.   Past medication trials: Vyvanse-effective Dexedrine-effective Adderall- mood swings, tearfulness and irritability Methylphenidate-effective for only brief periods of time Concerta-increased blood pressure Strattera-increased blood pressure. Ineffective.  Ophelia Charter PM- Effective Clonidine- Severe drowsiness Mirtazapine-low  energy Wellbutrin XL-helpful for mood at 150 mg daily. Had increased anxiety at 300 mg dose. Trintellix-Has been helpful for mood and anxiety. Prozac  PHQ2-9   Flowsheet Row Office Visit from 12/02/2017 in [facility]  PHQ-2 Total Score 0       Review of Systems:  Review of Systems  Constitutional: Positive for fatigue.  Respiratory: Positive for cough.   Cardiovascular: Negative for palpitations.  Endocrine: Positive for cold intolerance.  Musculoskeletal: Negative for gait problem.  Neurological: Negative for tremors.  Psychiatric/Behavioral:       Please refer to HPI   She had COVID at the start of the year   Medications: I have reviewed the patient's current medications.  Current Outpatient Medications  Medication Sig Dispense Refill   albuterol (VENTOLIN HFA) 108 (90 Base) MCG/ACT inhaler Inhale 2 puffs into the lungs as needed.     CRANBERRY PO Take by mouth.     etonogestrel-ethinyl estradiol (NUVARING) 0.12-0.015 MG/24HR vaginal ring Insert vaginally and leave in place for 3 consecutive weeks, then remove for 1 week. 3 each 3   fluticasone (FLONASE) 50 MCG/ACT nasal spray Place into both nostrils daily.     nitrofurantoin (MACRODANTIN) 100 MG capsule Take 100 mg by mouth as needed.     Prenatal Vit-Fe Fumarate-FA (PRENATAL VITAMINS) 28-0.8 MG TABS Take by mouth.     buPROPion (WELLBUTRIN SR) 200 MG 12 hr tablet Take 1 tablet (200 mg total) by mouth daily. 90 tablet 1   EPINEPHrine 0.3 mg/0.3 mL IJ SOAJ injection Inject 0.3 mLs (0.3 mg total) into the muscle once. 1 Device 0   rizatriptan (MAXALT) 10 MG  tablet Take 1 tablet (10 mg total) by mouth as needed for migraine. May repeat in 2 hours if needed 10 tablet 0   No current facility-administered medications for this visit.    Medication Side Effects: None  Allergies:  Allergies  Allergen Reactions   Banana Anaphylaxis   Other Anaphylaxis    Chickpeas   Augmentin  [Amoxicillin-Pot Clavulanate] Hives   Codeine     Other reaction(s): Vomiting   Hydrocodone Other (See Comments)   Penicillins Hives   Sudafed [Pseudoephedrine Hcl]    Tape Rash    Past Medical History:  Diagnosis Date   ADD (attention deficit disorder)    Allergic rhinitis    Anxiety    Asthma    Depression    DR. KAUR   Insomnia    Migraine    Other abnormal glucose    SEASONAL ALLERGIES   Panic attacks    Strabismic amblyopia of right eye     Family History  Problem Relation Age of Onset   Healthy Mother    Cancer Mother        BASEL CELL 84   Breast cancer Mother 69       DCIS   Healthy Father    Hyperlipidemia Father    Cancer Father        BASEL CELL 20   ADD / ADHD Father    Healthy Sister    Depression Sister    Liver cancer Maternal Grandmother    Cancer Maternal Grandmother        COLON   Schizophrenia Maternal Grandmother    Dementia Maternal Grandfather     Social History   Socioeconomic History   Marital status: Married    Spouse name: [name]   Number of children: 0   Years of education: 16   Highest education level: Not on file  Occupational History   Occupation: NURSE  Tobacco Use   Smoking status: Never Smoker   Smokeless tobacco: Never Used  Building services engineer Use: Never used  Substance and Sexual Activity   Alcohol use: Yes    Alcohol/week: 1.0 - 2.0 standard drink    Types: 1 - 2 Cans of beer per week    Comment: occasionally   Drug use: No   Sexual activity: Yes    Partners: Male    Birth control/protection: Inserts    Comment: Nuvaring  Other Topics Concern   Not on file  Social History Narrative   Not on file   Social Determinants of Health   Financial Resource Strain: Not on file  Food Insecurity: Not on file  Transportation Needs: Not on file  Physical Activity: Not on file  Stress: Not on file  Social Connections: Not on file  Intimate Partner Violence: Not on file     Past Medical History, Surgical history, Social history, and Family history were reviewed and updated as appropriate.   Please see review of systems for further details on the patient's review from today.   Objective:   Physical Exam:  BP (!) 151/87    Pulse (!) 106   Physical Exam Constitutional:      General: She is not in acute distress. Musculoskeletal:        General: No deformity.  Neurological:     Mental Status: She is alert and oriented to person, place, and time.     Coordination: Coordination normal.  Psychiatric:        Attention and Perception: Attention and  perception normal. She does not perceive auditory or visual hallucinations.        Mood and Affect: Affect is not labile, blunt, angry or inappropriate.        Speech: Speech normal.        Behavior: Behavior normal.        Thought Content: Thought content normal. Thought content is not paranoid or delusional. Thought content does not include homicidal or suicidal ideation. Thought content does not include homicidal or suicidal plan.        Cognition and Memory: Cognition and memory normal.        Judgment: Judgment normal.     Comments: Insight intact Mood is appropriate to content.  Affect is congruent.     Lab Review:     Component Value Date/Time   NA 134 (L) 10/23/2015 0146   K 3.3 (L) 10/23/2015 0146   CL 103 10/23/2015 0146   CO2 23 10/23/2015 0146   GLUCOSE 125 (H) 10/23/2015 0146   BUN 11 10/23/2015 0146   CREATININE 0.90 10/23/2015 0146   CALCIUM 9.4 10/23/2015 0146   GFRNONAA >60 10/23/2015 0146   GFRAA >60 10/23/2015 0146       Component Value Date/Time   WBC 7.1 04/05/2017 1206   WBC 9.2 10/23/2015 0146   RBC 4.87 04/05/2017 1206   RBC 5.42 (H) 10/23/2015 0146   HGB 14.3 04/05/2017 1206   HCT 43.0 04/05/2017 1206   PLT 291 04/05/2017 1206   MCV 88 04/05/2017 1206   MCH 29.4 04/05/2017 1206   MCH 29.6 10/23/2015 0146   MCHC 33.3 04/05/2017 1206   MCHC 34.9 10/23/2015 0146    RDW 12.8 04/05/2017 1206   LYMPHSABS 2.9 04/05/2017 1206   EOSABS 0.3 04/05/2017 1206   BASOSABS 0.0 04/05/2017 1206    No results found for: POCLITH, LITHIUM   No results found for: PHENYTOIN, PHENOBARB, VALPROATE, CBMZ   .res Assessment: Plan:   Patient reports that she would prefer not to restart a stimulant at this time.  Advised patient to contact office if she decides that she would like to restart a stimulant. Will continue Wellbutrin SR 200 mg daily for depression. Patient to follow-up in 3 months or sooner if clinically indicated. Patient advised to contact office with any questions, adverse effects, or acute worsening in signs and symptoms.  [name] was seen today for follow-up.  Diagnoses and all orders for this visit:  Anxiety  Mild episode of recurrent major depressive disorder (HCC) -     buPROPion (WELLBUTRIN SR) 200 MG 12 hr tablet; Take 1 tablet (200 mg total) by mouth daily.  Attention deficit hyperactivity disorder (ADHD), combined type     Please see After Visit Summary for patient specific instructions.  Future Appointments  Date Time Provider Department Center  06/10/2020  9:30 AM Corie Chiquito, PMHNP [facility] None    No orders of the defined types were placed in this encounter.   -------------------------------

## 2020-03-13 DIAGNOSIS — F411 Generalized anxiety disorder: Secondary | ICD-10-CM | POA: Diagnosis not present

## 2020-03-13 DIAGNOSIS — F330 Major depressive disorder, recurrent, mild: Secondary | ICD-10-CM | POA: Diagnosis not present

## 2020-03-18 DIAGNOSIS — F4320 Adjustment disorder, unspecified: Secondary | ICD-10-CM | POA: Diagnosis not present

## 2020-03-20 DIAGNOSIS — F411 Generalized anxiety disorder: Secondary | ICD-10-CM | POA: Diagnosis not present

## 2020-03-20 DIAGNOSIS — F330 Major depressive disorder, recurrent, mild: Secondary | ICD-10-CM | POA: Diagnosis not present

## 2020-03-27 DIAGNOSIS — F411 Generalized anxiety disorder: Secondary | ICD-10-CM | POA: Diagnosis not present

## 2020-03-27 DIAGNOSIS — F330 Major depressive disorder, recurrent, mild: Secondary | ICD-10-CM | POA: Diagnosis not present

## 2020-04-02 DIAGNOSIS — F4320 Adjustment disorder, unspecified: Secondary | ICD-10-CM | POA: Diagnosis not present

## 2020-04-10 DIAGNOSIS — F411 Generalized anxiety disorder: Secondary | ICD-10-CM | POA: Diagnosis not present

## 2020-04-10 DIAGNOSIS — F330 Major depressive disorder, recurrent, mild: Secondary | ICD-10-CM | POA: Diagnosis not present

## 2020-04-14 DIAGNOSIS — F4320 Adjustment disorder, unspecified: Secondary | ICD-10-CM | POA: Diagnosis not present

## 2020-04-16 DIAGNOSIS — F411 Generalized anxiety disorder: Secondary | ICD-10-CM | POA: Diagnosis not present

## 2020-04-16 DIAGNOSIS — F330 Major depressive disorder, recurrent, mild: Secondary | ICD-10-CM | POA: Diagnosis not present

## 2020-04-22 ENCOUNTER — Ambulatory Visit: Payer: 59 | Admitting: Psychiatry

## 2020-06-10 ENCOUNTER — Ambulatory Visit: Payer: BC Managed Care – PPO | Admitting: Psychiatry

## 2020-07-10 ENCOUNTER — Encounter: Payer: Self-pay | Admitting: Family Medicine

## 2020-07-10 ENCOUNTER — Other Ambulatory Visit: Payer: Self-pay | Admitting: Family Medicine

## 2020-07-10 DIAGNOSIS — F330 Major depressive disorder, recurrent, mild: Secondary | ICD-10-CM

## 2020-07-10 MED ORDER — BUPROPION HCL ER (SR) 200 MG PO TB12: 200.0000 mg | ORAL_TABLET | Freq: Every day | ORAL | 1 refills | Status: DC

## 2020-07-10 MED ORDER — ALBUTEROL SULFATE HFA 108 (90 BASE) MCG/ACT IN AERS: 2.0000 | INHALATION_SPRAY | RESPIRATORY_TRACT | 5 refills | Status: DC | PRN

## 2020-07-10 NOTE — Telephone Encounter (Signed)
Medication Refill - Medication: albuterol (VENTOLIN HFA) 108 (90 Base) MCG/ACT inhaler EPINEPHrine 0.3 mg/0.3 mL IJ SOAJ injection    Preferred Pharmacy (with phone number or street name):  [id number] [place], [place] - [address] Phone:  [phone]  Fax:  [phone]      Agent: Please be advised that RX refills may take up to 3 business days. We ask that you follow-up with your pharmacy.

## 2020-07-10 NOTE — Telephone Encounter (Signed)
Requested medication (s) are due for refill today - no  Requested medication (s) are on the active medication list -yes  Future visit scheduled -yes  Last refill: bupropion- 03/11/20 #90 1RF                 Albuterol- historical  Notes to clinic: Patient request medication: outside provider, historical  Requested Prescriptions  Pending Prescriptions Disp Refills   buPROPion (WELLBUTRIN SR) 200 MG 12 hr tablet 90 tablet 1    Sig: Take 1 tablet (200 mg total) by mouth daily.      Psychiatry: Antidepressants - bupropion Failed - 07/10/2020  4:13 PM      Failed - Last BP in normal range    BP Readings from Last 1 Encounters:  03/04/20 118/74          Passed - Valid encounter within last 6 months    Recent Outpatient Visits           4 months ago Cough   [facility] Chrismon, Jodell Cipro, PA-C   10 months ago Hearing loss of left ear, unspecified hearing loss type   [facility] [place], Adriana M, PA-C   2 years ago Left otitis media, unspecified otitis media type   [facility] Chrismon, Jodell Cipro, PA-C   2 years ago Asthmatic bronchitis, mild intermittent, with acute exacerbation   [facility], Jodell Cipro, PA-C   3 years ago Encounter for school history and physical examination   [facility], Jodell Cipro, PA-C       Future Appointments             In 4 weeks Chrismon, Jodell Cipro, PA-C [facility]               albuterol (VENTOLIN HFA) 108 (90 Base) MCG/ACT inhaler      Sig: Inhale 2 puffs into the lungs as needed.      Pulmonology:  Beta Agonists Failed - 07/10/2020  4:13 PM      Failed - One inhaler should last at least one month. If the patient is requesting refills earlier, contact the patient to check for uncontrolled symptoms.      Passed - Valid encounter within last 12 months    Recent Outpatient Visits           4 months ago Cough   [facility] Chrismon, Jodell Cipro, PA-C   10 months ago Hearing loss of left ear, unspecified hearing loss type   [facility] [place], Adriana M, PA-C   2 years ago Left otitis media, unspecified otitis media type   [facility] Chrismon, Jodell Cipro, PA-C   2 years ago Asthmatic bronchitis, mild intermittent, with acute exacerbation   [facility], Jodell Cipro, PA-C   3 years ago Encounter for school history and physical examination   [facility] [facility], Jodell Cipro, PA-C       Future Appointments             In 4 weeks Chrismon, Jodell Cipro, PA-C [facility]                 Requested Prescriptions  Pending Prescriptions Disp Refills   buPROPion (WELLBUTRIN SR) 200 MG 12 hr tablet 90 tablet 1    Sig: Take 1 tablet (200 mg total) by mouth daily.      Psychiatry: Antidepressants - bupropion Failed - 07/10/2020  4:13 PM      Failed - Last BP in normal range    BP Readings from Last 1 Encounters:  03/04/20 118/74          Passed - Valid encounter within last 6 months    Recent Outpatient Visits           4 months ago Cough   [facility] Chrismon, Jodell Cipro, PA-C   10 months ago Hearing loss of left ear, unspecified hearing loss type   [facility], Adriana M, PA-C   2 years ago Left otitis media, unspecified otitis media type   [facility] Chrismon, Jodell Cipro, PA-C   2 years ago Asthmatic bronchitis, mild intermittent, with acute exacerbation   [facility], Jodell Cipro, PA-C   3 years ago Encounter for school history and physical examination   [facility] [facility], Jodell Cipro, PA-C       Future Appointments             In 4 weeks Chrismon, Jodell Cipro, PA-C [facility]               albuterol (VENTOLIN HFA) 108 (90 Base) MCG/ACT inhaler      Sig: Inhale 2 puffs into the lungs as needed.       Pulmonology:  Beta Agonists Failed - 07/10/2020  4:13 PM      Failed - One inhaler should last at least one month. If the patient is requesting refills earlier, contact the patient to check for uncontrolled symptoms.      Passed - Valid encounter within last 12 months    Recent Outpatient Visits           4 months ago Cough   [facility] Chrismon, Jodell Cipro, PA-C   10 months ago Hearing loss of left ear, unspecified hearing loss type   [facility] [place], Adriana M, [place]   2 years ago Left otitis media, unspecified otitis media type   [facility] Chrismon, Jodell Cipro, PA-C   2 years ago Asthmatic bronchitis, mild intermittent, with acute exacerbation   [facility], Jodell Cipro, PA-C   3 years ago Encounter for school history and physical examination   [employer] [facility], Jodell Cipro, PA-C       Future Appointments             In 4 weeks Chrismon, Jodell Cipro, PA-C [facility]

## 2020-07-11 MED ORDER — EPINEPHRINE 0.3 MG/0.3ML IJ SOAJ: 0.3000 mg | Freq: Once | INTRAMUSCULAR | 0 refills | Status: AC

## 2020-07-22 ENCOUNTER — Encounter: Payer: Self-pay | Admitting: Psychiatry

## 2020-07-22 ENCOUNTER — Ambulatory Visit (INDEPENDENT_AMBULATORY_CARE_PROVIDER_SITE_OTHER): Payer: Self-pay | Admitting: Psychiatry

## 2020-07-22 ENCOUNTER — Other Ambulatory Visit: Payer: Self-pay

## 2020-07-22 VITALS — BP 144/97 | HR 90

## 2020-07-22 DIAGNOSIS — F419 Anxiety disorder, unspecified: Secondary | ICD-10-CM

## 2020-07-22 DIAGNOSIS — F331 Major depressive disorder, recurrent, moderate: Secondary | ICD-10-CM

## 2020-07-22 MED ORDER — DESVENLAFAXINE SUCCINATE ER 25 MG PO TB24: 25.0000 mg | ORAL_TABLET | Freq: Every morning | ORAL | 1 refills | Status: DC

## 2020-07-22 NOTE — Progress Notes (Signed)
Debra Garza [id number] 1981/01/19 40 y.o.  Subjective:   Patient ID:  Debra Garza is a 40 y.o. (DOB [birth year]) female.  Chief Complaint:  Chief Complaint  Patient presents with   ADHD   Depression   Anxiety    Depression        Past medical history includes anxiety.   Anxiety    [name] Pecinich Begeman presents to the office today for follow-up of anxiety, depression, and ADHD. She reports, "my marriage is falling apart." She reports that husband "ignores me most of the time" and has given her a "deadline" of New Year's Eve to make certain changes. She reports that he wants her not to ask as many questions about what she should be doing. She reports that she has anxiety in response to marital stressors.   She reports distractibility and that husband perceives this as ignoring her. She reports that she has not seen a significant change in concentration without medication- "I didn't really think it was working." She reports that she is able to focus at work and copes by doing things immediately instead of delaying them.   Mood has been depressed. Anxiety has been elevated. Denies panic attacks. Reports periods of high anxiety internally. Sleeping ok. "Generally been exhausted." She feels that Wellbutrin XL is no longer as effective and notices more negative thoughts. Motivation is lower. Appetite has been ok. She reports that she had fleeting suicidal thoughts this week about over taking Propranolol. She threw out medication and told her husband. Denies current SI. Contracts for safety.   She is continuing to see trauma therapist, Mitzi Hansen, and marriage counselor. Husband is also seeing a therapist.  She reports job change has been going ok and enjoys the job. She reports that she has not been late to work.   Past medication trials: Vyvanse-effective Dexedrine-effective Adderall- mood swings, tearfulness and irritability Methylphenidate-effective for  only brief periods of time Concerta-increased blood pressure Strattera-increased blood pressure. Ineffective.  Ophelia Charter PM- Minimally effective Clonidine- Severe drowsiness Mirtazapine-low energy Wellbutrin XL-helpful for mood at 150 mg daily.  Had increased anxiety at 300 mg dose. Trintellix-Has been helpful for mood and anxiety.  Prozac    PHQ2-9    Flowsheet Row Office Visit from 12/02/2017 in [facility]  PHQ-2 Total Score 0        Review of Systems:  Review of Systems  Psychiatric/Behavioral:  Positive for depression.    Medications: I have reviewed the patient's current medications.  Current Outpatient Medications  Medication Sig Dispense Refill   albuterol (VENTOLIN HFA) 108 (90 Base) MCG/ACT inhaler Inhale 2 puffs into the lungs as needed. 1 each 5   cetirizine (ZYRTEC) 10 MG tablet Take 10 mg by mouth daily.     CRANBERRY PO Take by mouth.     Desvenlafaxine Succinate ER 25 MG TB24 Take 25 mg by mouth every morning. 30 tablet 1   etonogestrel-ethinyl estradiol (NUVARING) 0.12-0.015 MG/24HR vaginal ring Insert vaginally and leave in place for 3 consecutive weeks, then remove for 1 week. 3 each 3   fluticasone (FLONASE) 50 MCG/ACT nasal spray Place into both nostrils daily.     nitrofurantoin (MACRODANTIN) 100 MG capsule Take 100 mg by mouth as needed.     Prenatal Vit-Fe Fumarate-FA (PRENATAL VITAMINS) 28-0.8 MG TABS Take by mouth.     rizatriptan (MAXALT) 10 MG tablet Take 1 tablet (10 mg total) by mouth as needed for migraine. May repeat in 2 hours if needed 10 tablet  0   No current facility-administered medications for this visit.    Medication Side Effects: Other: Possible sexual side effects (decreased libido)  Allergies:  Allergies  Allergen Reactions   Banana Anaphylaxis   Other Anaphylaxis    Chickpeas   Augmentin [Amoxicillin-Pot Clavulanate] Hives   Codeine     Other reaction(s): Vomiting   Hydrocodone Other (See Comments)   Penicillins  Hives   Sudafed [Pseudoephedrine Hcl]    Tape Rash    Past Medical History:  Diagnosis Date   ADD (attention deficit disorder)    Allergic rhinitis    Anxiety    Asthma    Depression    DR. KAUR   Insomnia    Migraine    Other abnormal glucose    SEASONAL ALLERGIES   Panic attacks    Strabismic amblyopia of right eye     Past Medical History, Surgical history, Social history, and Family history were reviewed and updated as appropriate.   Please see review of systems for further details on the patient's review from today.   Objective:   Physical Exam:  BP (!) 144/97   Pulse 90   Physical Exam Constitutional:      General: She is not in acute distress. Musculoskeletal:        General: No deformity.  Neurological:     Mental Status: She is alert and oriented to person, place, and time.     Coordination: Coordination normal.  Psychiatric:        Attention and Perception: Attention and perception normal. She does not perceive auditory or visual hallucinations.        Mood and Affect: Mood is anxious and depressed. Affect is tearful. Affect is not labile, blunt, angry or inappropriate.        Speech: Speech normal.        Behavior: Behavior normal.        Thought Content: Thought content normal. Thought content is not paranoid or delusional. Thought content does not include homicidal or suicidal ideation. Thought content does not include homicidal or suicidal plan.        Cognition and Memory: Cognition and memory normal.        Judgment: Judgment normal.     Comments: Insight intact    Lab Review:     Component Value Date/Time   NA 134 (L) 10/23/2015 0146   K 3.3 (L) 10/23/2015 0146   CL 103 10/23/2015 0146   CO2 23 10/23/2015 0146   GLUCOSE 125 (H) 10/23/2015 0146   BUN 11 10/23/2015 0146   CREATININE 0.90 10/23/2015 0146   CALCIUM 9.4 10/23/2015 0146   GFRNONAA >60 10/23/2015 0146   GFRAA >60 10/23/2015 0146       Component Value Date/Time   WBC 7.1  04/05/2017 1206   WBC 9.2 10/23/2015 0146   RBC 4.87 04/05/2017 1206   RBC 5.42 (H) 10/23/2015 0146   HGB 14.3 04/05/2017 1206   HCT 43.0 04/05/2017 1206   PLT 291 04/05/2017 1206   MCV 88 04/05/2017 1206   MCH 29.4 04/05/2017 1206   MCH 29.6 10/23/2015 0146   MCHC 33.3 04/05/2017 1206   MCHC 34.9 10/23/2015 0146   RDW 12.8 04/05/2017 1206   LYMPHSABS 2.9 04/05/2017 1206   EOSABS 0.3 04/05/2017 1206   BASOSABS 0.0 04/05/2017 1206    No results found for: POCLITH, LITHIUM   No results found for: PHENYTOIN, PHENOBARB, VALPROATE, CBMZ   .res Assessment: Plan:   Patient seen for 30  minutes and time spent counseling patient regarding treatment options for mood and anxiety signs and symptoms.  Discussed discontinuing Wellbutrin since she questions if this is effective and possibly causing side effects.  Also discussed potential benefits, risks, and side effects of other treatment options for depression to include Pristiq, Trintellix, and Viibryd. Will stop Wellbutrin and start Pristiq 25 mg daily for mood and anxiety. Recommend continuing therapy. Patient follow-up with this provider in 6 weeks or sooner if clinically indicated. Patient advised to contact office with any questions, adverse effects, or acute worsening in signs and symptoms.  [name] was seen today for adhd, depression and anxiety.  Diagnoses and all orders for this visit:  Anxiety -     Desvenlafaxine Succinate ER 25 MG TB24; Take 25 mg by mouth every morning.  Moderate episode of recurrent major depressive disorder (HCC) -     Desvenlafaxine Succinate ER 25 MG TB24; Take 25 mg by mouth every morning.    Please see After Visit Summary for patient specific instructions.  Future Appointments  Date Time Provider Department Center  08/07/2020  8:00 AM Chrismon, Jodell Cipro, PA-C [facility]    No orders of the defined types were placed in this encounter.   -------------------------------

## 2020-08-07 ENCOUNTER — Ambulatory Visit: Payer: BC Managed Care – PPO | Admitting: Family Medicine

## 2020-08-12 ENCOUNTER — Ambulatory Visit: Payer: BC Managed Care – PPO | Admitting: Family Medicine

## 2020-08-14 ENCOUNTER — Other Ambulatory Visit: Payer: Self-pay | Admitting: Psychiatry

## 2020-08-14 DIAGNOSIS — F331 Major depressive disorder, recurrent, moderate: Secondary | ICD-10-CM

## 2020-08-14 DIAGNOSIS — F419 Anxiety disorder, unspecified: Secondary | ICD-10-CM

## 2020-08-25 ENCOUNTER — Encounter: Payer: Self-pay | Admitting: Family Medicine

## 2020-08-25 ENCOUNTER — Ambulatory Visit: Payer: BC Managed Care – PPO | Admitting: Family Medicine

## 2020-08-25 ENCOUNTER — Other Ambulatory Visit: Payer: Self-pay

## 2020-08-25 VITALS — BP 124/85 | HR 87 | Temp 98.4°F | Wt 116.0 lb

## 2020-08-25 DIAGNOSIS — S63501A Unspecified sprain of right wrist, initial encounter: Secondary | ICD-10-CM

## 2020-08-25 DIAGNOSIS — T7840XA Allergy, unspecified, initial encounter: Secondary | ICD-10-CM | POA: Diagnosis not present

## 2020-08-25 DIAGNOSIS — K5900 Constipation, unspecified: Secondary | ICD-10-CM | POA: Diagnosis not present

## 2020-08-25 IMAGING — CR RIGHT ANKLE - COMPLETE 3+ VIEW
1 series · 3 of 3 positions shown · non-contrast
Comparison: None.

CLINICAL DATA: Inversion injury several hours ago with persistent
pain, initial encounter

EXAM:
RIGHT ANKLE - COMPLETE 3+ VIEW

[Series 1: dg ankle complete right · 0.14mm/px · 3 of 3 slices shown]
[im 1/3]
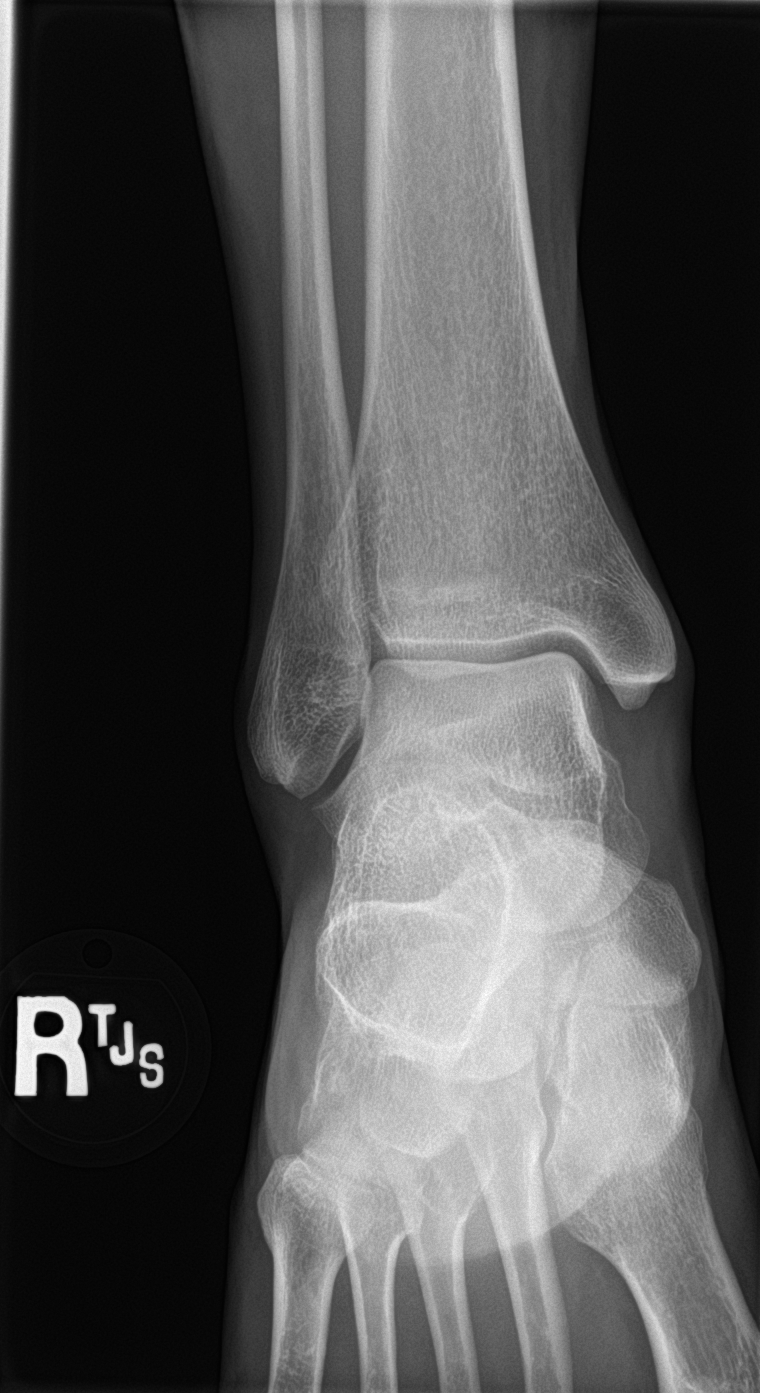
[im 2/3]
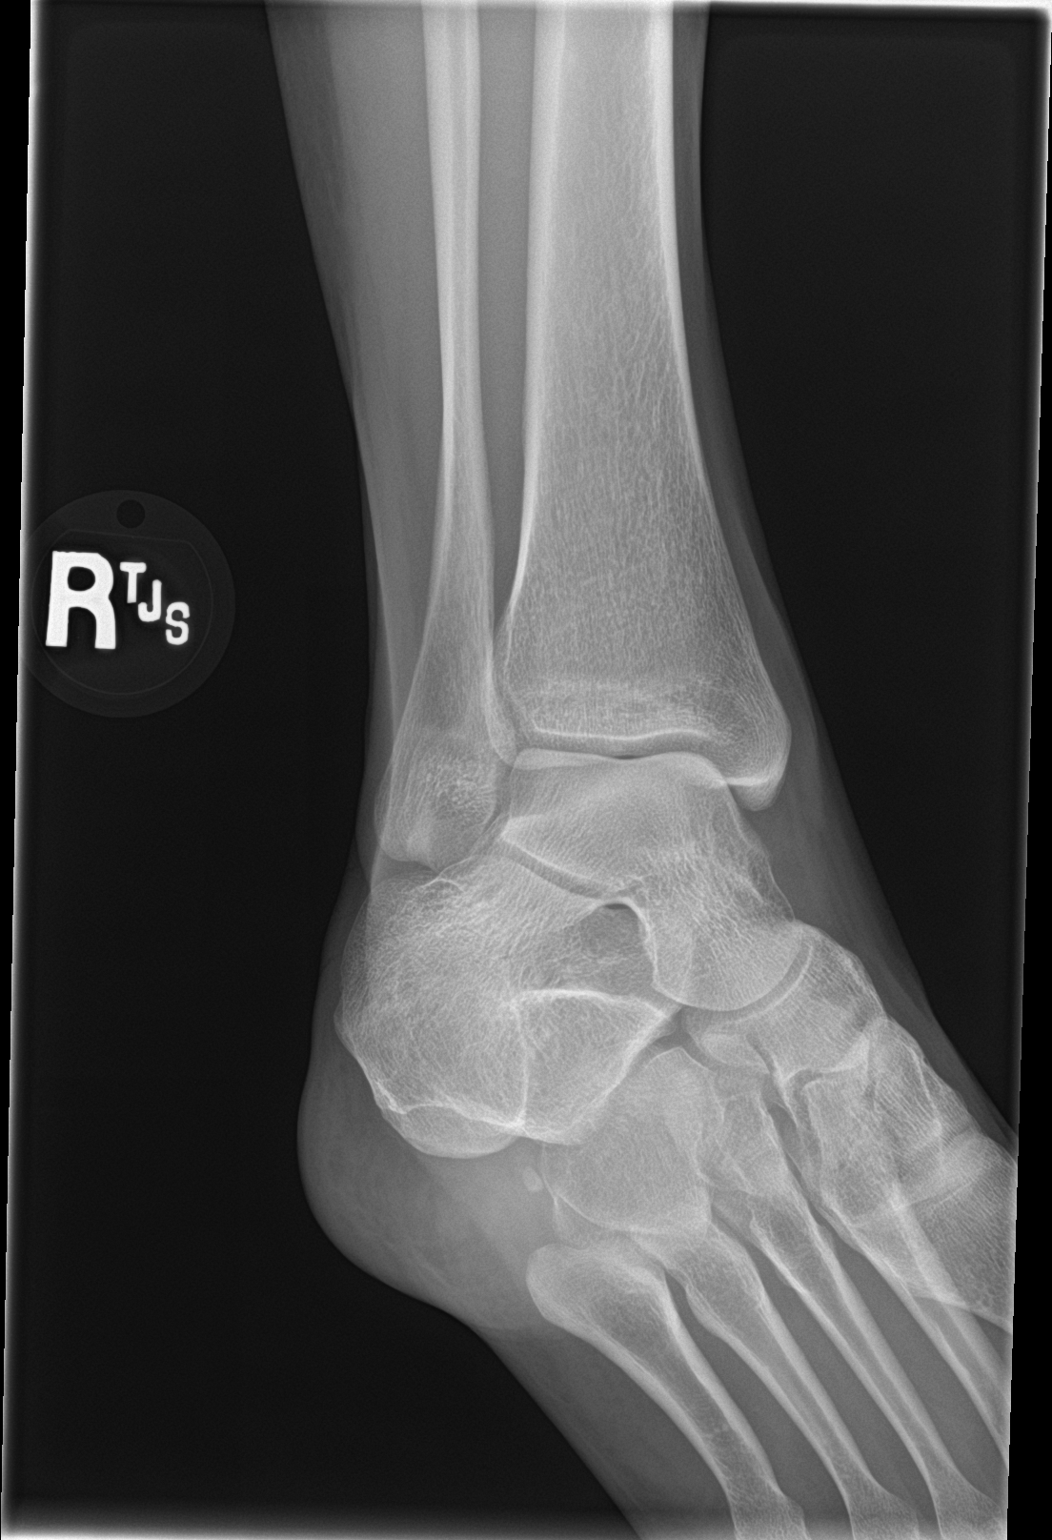
[im 3/3]
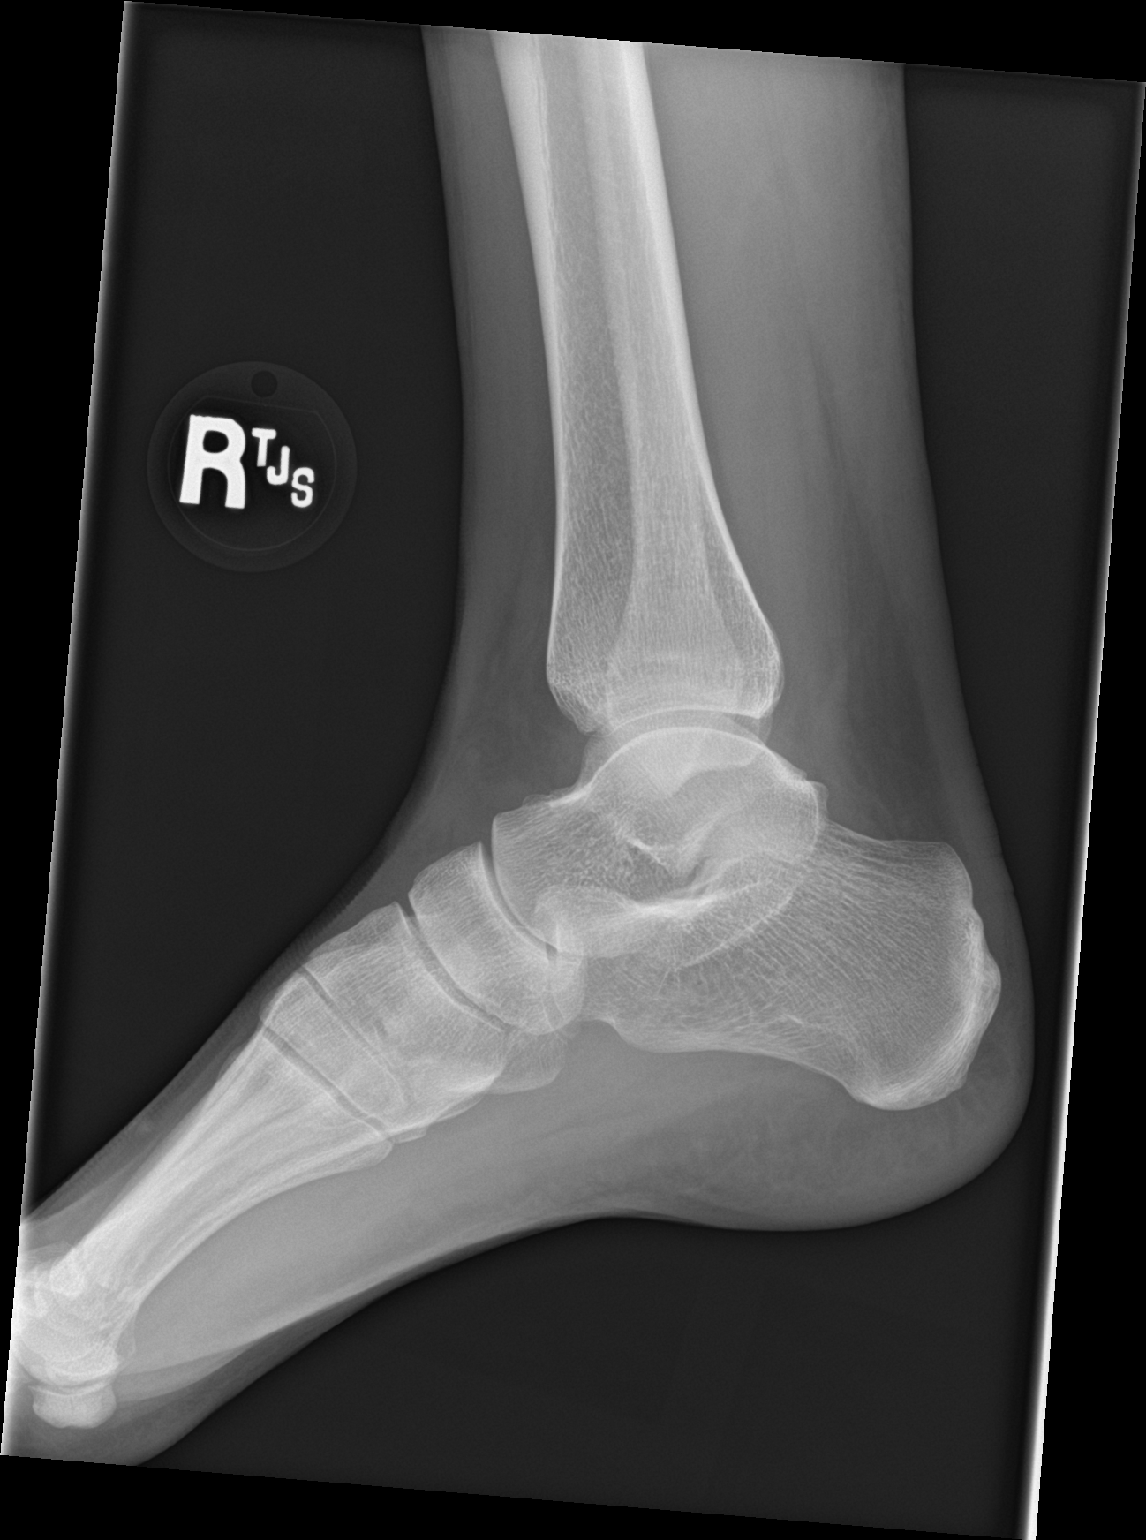

[3 of 3 positions shown; findings below may reference images not displayed]

FINDINGS: There is no evidence of fracture, dislocation, or joint effusion.
There is no evidence of arthropathy or other focal bone abnormality.
Soft tissues are unremarkable.
IMPRESSION: No acute abnormality noted.

## 2020-08-25 NOTE — Patient Instructions (Addendum)
Drug Allergy A drug allergy happens when the body's disease-fighting system (immune system) reacts badly to a medicine. Drug allergies range from mild to severe. Some allergic reactions occur one week or more after you are exposed to a medicine (delayed reaction). A sudden (acute), severe allergic reaction that affects multiple areas of the body is called an anaphylactic reaction (anaphylaxis). Anaphylaxis can be life-threatening. All allergic reactions to a medicinerequire medical evaluation, even if the allergic reaction appears to be mild. What are the causes? This condition is caused by the immune system wrongly identifying a medicine as being harmful. When this happens, the body releases proteins (antibodies) and other compounds, such as histamine, into the bloodstream. This causes swelling in certain tissues and reduces blood flow to important areas, such asthe heart and lungs. Almost any medicine can cause an allergic reaction. Medicines that commonly cause allergic reactions (are common allergens) include: Penicillin. Sulfa medicines (sulfonamides). Medicines that numb certain areas of the body (local anesthetics). X-ray dyes that contain iodine. What are the signs or symptoms? Common symptoms of a mild allergic reaction include: Nasal congestion. Tingling in the mouth. An itchy, red rash. Common symptoms of a severe allergic reaction include: Swelling of the eyes, lips, face, or tongue. Swelling of the back of the mouth and the throat. Wheezing. A hoarse voice. Itchy, red, swollen areas of skin (hives). Dizziness or light-headedness. Fainting. Anxiety or confusion. Abdominal pain. Difficulty breathing, speaking, or swallowing. Chest tightness. Fast or irregular heartbeats (palpitations). Vomiting. Diarrhea. How is this diagnosed? This condition is diagnosed based on a physical exam and your history of recent exposure to one or more medicines. You may be referred for follow-up  testing by a health care provider who specializes in allergies. This testing can confirm the diagnosis of a drug allergy and determine which medicines you are allergic to. Testing may include: Skin tests. These may involve: Injecting a small amount of the possible allergen between layers of your skin (intradermal injection). Applying patches to your skin. Blood tests. Drug challenge. For this test, a health care provider gives you a small amount of a medicine in gradual doses while watching for an allergic reaction. If you are unsure of what caused your allergic reaction, your health care provider may ask you for: Information about all medicines that you take on a regular basis. The date and time of your reaction. How is this treated? There is no cure for allergies. However, an allergic reaction can be treated with: Medicines that help: Reduce pain and swelling (NSAIDs). Relieve itching and hives (antihistamines). Reduce swelling (corticosteroids). Respiratory inhalers. These are inhaled medicines that help open (dilate) the airways in your lungs. Injections of medicine that helps to relax the muscles in your airways and tighten your blood vessels (epinephrine). Severe allergic reactions, such as anaphylaxis, require immediate treatment in a hospital. You may need to be hospitalized for observation. You may also be prescribed rescue medicines, such as epinephrine. Epinephrine comes in many forms, including what is commonly called an auto-injector "pen" (pre-filled automatic epinephrine injection device). Follow these instructions at home: If you have a severe allergy  Always keep an auto-injector pen or your anaphylaxis kit near you. These can be lifesaving if you have a severe reaction. Use your auto-injector pen or anaphylaxis kit as told by your health care provider. Make sure that you, the members of your household, and your employer know: How to use an anaphylaxis kit. How to use an  auto-injector pen to give you an  epinephrine injection. Replace your epinephrine immediately after you use your auto-injector pen, in case you have another reaction. Wear a medical alert bracelet or necklace that states your drug allergy, if told by your health care provider.  General instructions Avoid medicines that you are allergic to. Take over-the-counter and prescription medicines only as told by your health care provider. If you were given medicines to treat your reaction, do not drive until your health care provider approves. If you have hives or a rash: Use an over-the-counter antihistamine as told by your health care provider. Apply cold, wet cloths (cold compresses) to your skin or take baths or showers in cool water. Avoid hot water. If you had tests done, it is up to you to get your test results. Ask your health care provider when your results will be ready. Tell any health care providers who care for you that you have a drug allergy. Keep all follow-up visits as told by your health care provider. This is important. Contact a health care provider if: You think that you are having a mild allergic reaction. Symptoms of an allergic reaction usually start within 30 minutes after you are exposed to a medicine. You have symptoms that last more than 2 days after your reaction. Your symptoms get worse. You develop new symptoms. Get help right away if: You needed to use epinephrine. An epinephrine injection helps to manage life-threatening allergic reactions, but you still need to go to the emergency room even if epinephrine seems to work. This is important because anaphylaxis may happen again within 72 hours (rebound anaphylaxis). If you used epinephrine to treat anaphylaxis outside of the hospital, you need additional medical care. This may include more doses of epinephrine. You develop symptoms of a severe allergic reaction. These symptoms may represent a serious problem that is an  emergency. Do not wait to see if the symptoms will go away. Use your auto-injector pen or anaphylaxis kit as you have been instructed, and get medical help right away. Call your local emergency services (911 in the [place] Do not drive yourself to the hospital. Summary A drug allergy happens when the body's disease-fighting system reacts badly to a medicine. Drug allergies range from mild to severe. In some cases, an allergic reaction may be life-threatening. If you have a severe allergy, always keep an auto-injector pen or your anaphylaxis kit near you. This information is not intended to replace advice given to you by your health care provider. Make sure you discuss any questions you have with your healthcare provider. Document Revised: 07/27/2017 Document Reviewed: 07/27/2017 Elsevier Patient Education  2022 [place], Adult A wrist sprain is a stretch or tear in the strong tissues that connect the wrist bones to each other. These strong tissues are called ligaments. There are three types of wrist sprains: Grade 1. The ligament is stretched more than normal. There may be a minor amount of wrist pain. Grade 2. The ligament is partially torn. You may be able to move your wrist, but not very much. There may be a moderate amount of wrist pain. Grade 3. The ligament or ligaments are completely torn. You may find it difficult to move your wrist even a little. There may be a significant amount of wrist pain. What are the causes? This condition may be caused by using the wrist too much during sports,exercise, or work. It can also happen due to a fall or during an accident. What increases the risk? You are more likely  to develop this condition if: You had a previous wrist or arm injury. You have poor wrist strength and flexibility. You play contact sports, such as football or soccer. You participate in sports that may result in a fall, such as skateboarding, biking, skiing, or  snowboarding. You do not exercise regularly. You use exercise equipment that does not fit well. What are the signs or symptoms? Symptoms of this condition include: Pain in the wrist, arm, or hand. Swelling or bruised skin near the wrist, hand, or arm. The skin may look yellow or blue. Stiffness or trouble moving the hand. Hearing a noise, like a pop or a snap, at the time of injury, or feeling a tear at the time of the injury. A warm feeling in the skin around the wrist. How is this diagnosed? This condition is diagnosed with a physical exam. Sometimes an X-ray is taken to make sure a bone did not break. You may also have an MRI of your wrist tocheck for torn ligaments. How is this treated? This condition is treated by resting and applying ice to your wrist. Additional treatment may include: Taking medicine for pain and inflammation. Wearing a splint, brace, or cast for a short period of time to keep your wrist from moving (immobilized). Doing exercises to strengthen and stretch your wrist. Having surgery. This may be done if the ligament is completely torn. Follow these instructions at home: If you have a splint or brace: Wear the splint or brace as told by your health care provider. Remove it only as told by your health care provider. Loosen it if your fingers tingle, become numb, or turn cold and blue. Keep it clean. If the splint or brace is not waterproof: Do not let it get wet. Cover it with a watertight covering when you take a bath or a shower. If you have a cast: Do not put pressure on any part of the cast until it is fully hardened. This may take several hours. Do not stick anything inside the cast to scratch your skin. Doing that increases your risk of infection. Check the skin around the cast every day. Tell your health care provider about any concerns. You may put lotion on dry skin around the edges of the cast. Do not put lotion on the skin underneath the cast. Keep it  clean. If the cast is not waterproof: Do not let it get wet. Cover it with a watertight covering when you take a bath or shower. Managing pain, stiffness, and swelling  If directed, put ice on the injured area. To do this: If you have a removable splint or brace, remove it as told by your health care provider. Put ice in a plastic bag. Place a towel between your skin and the bag or between the splint or cast and the bag. Leave the ice on for 20 minutes, 2-3 times a day. Remove the ice if your skin turns bright red. This is very important. If you cannot feel pain, heat, or cold, you have a greater risk of damage to the area. Move your fingers often to reduce stiffness and swelling. Raise (elevate) the injured area above the level of your heart while you are sitting or lying down.  Activity Rest your wrist as told by your health care provider. Do not do things that cause pain. Ask your health care provider when it is safe to drive if you have a splint, brace, or cast on your wrist. Do exercises as told by  your health care provider. Return to your normal activities as told by your health care provider. Ask your health care provider what activities are safe for you. General instructions Take over-the-counter and prescription medicines only as told by your health care provider. Do not use any products that contain nicotine or tobacco, such as cigarettes, e-cigarettes, and chewing tobacco. These can delay healing. If you need help quitting, ask your health care provider. Keep all follow-up visits. This is important. Contact a health care provider if: Your pain, bruising, or swelling gets worse. Your skin becomes red, gets a rash, or has open sores. Your pain does not get better or it gets worse. Get help right away if: You have a new or sudden sharp pain in the hand, arm, or wrist. You have tingling or numbness in your hand. Your fingers turn white, very red, or cold and blue. You cannot  move your fingers. Summary A wrist sprain is damage to ligaments in your wrist. Wrist sprains can range from mild to severe. Return to your normal activities as told by your health care provider. Ask your health care provider what activities are safe for you. You may need to wear a splint, brace, or cast for a short period of time. This information is not intended to replace advice given to you by your health care provider. Make sure you discuss any questions you have with your healthcare provider. Document Revised: 05/21/2019 Document Reviewed: 05/21/2019 Elsevier Patient Education  2022 [place]. Constipation, Adult Constipation is when a person has fewer than three bowel movements in a week, has difficulty having a bowel movement, or has stools (feces) that are dry, hard, or larger than normal. Constipation may be caused by an underlying condition. It may become worse with age if a person takes certainmedicines and does not take in enough fluids. Follow these instructions at home: Eating and drinking  Eat foods that have a lot of fiber, such as beans, whole grains, and fresh fruits and vegetables. Limit foods that are low in fiber and high in fat and processed sugars, such as fried or sweet foods. These include french fries, hamburgers, cookies, candies, and soda. Drink enough fluid to keep your urine pale yellow.  General instructions Exercise regularly or as told by your health care provider. Try to do 150 minutes of moderate exercise each week. Use the bathroom when you have the urge to go. Do not hold it in. Take over-the-counter and prescription medicines only as told by your health care provider. This includes any fiber supplements. During bowel movements: Practice deep breathing while relaxing the lower abdomen. Practice pelvic floor relaxation. Watch your condition for any changes. Let your health care provider know about them. Keep all follow-up visits as told by your  health care provider. This is important. Contact a health care provider if: You have pain that gets worse. You have a fever. You do not have a bowel movement after 4 days. You vomit. You are not hungry or you lose weight. You are bleeding from the opening between the buttocks (anus). You have thin, pencil-like stools. Get help right away if: You have a fever and your symptoms suddenly get worse. You leak stool or have blood in your stool. Your abdomen is bloated. You have severe pain in your abdomen. You feel dizzy or you faint. Summary Constipation is when a person has fewer than three bowel movements in a week, has difficulty having a bowel movement, or has stools (feces) that are  dry, hard, or larger than normal. Eat foods that have a lot of fiber, such as beans, whole grains, and fresh fruits and vegetables. Drink enough fluid to keep your urine pale yellow. Take over-the-counter and prescription medicines only as told by your health care provider. This includes any fiber supplements. This information is not intended to replace advice given to you by your health care provider. Make sure you discuss any questions you have with your healthcare provider. Document Revised: 11/29/2018 Document Reviewed: 11/29/2018 Elsevier Patient Education  [place].

## 2020-08-25 NOTE — Progress Notes (Signed)
Acute Office Visit  Subjective:    Patient ID: Debra Garza, female    DOB: [birth year], 40 y.o.   MRN: [id number]  No chief complaint on file.   HPI Patient is in today for evaluation of allergies.  Patient has allergy to bananas and is exposed to them at work frequently.  She states she takes Zyrtec regularly and then treats symptoms when in contact with them. Constipation-patient states that since starting the Desvenlofaxine she has been having difficulty with constipation.  She states she has been increasing fluids, taking fiber and eating fruit to help but has not seen good results. Right wrist pain.  Patient states that for about 1 months she has been has been having pain in her right wrist.  She first noticed it after gardening and now she has pain when doing repetitive movements.  She states by the end of the day she has to wear a splint on that wrist because of pain.  She has also noticed that she has swelling in the wrist.  She found that when she can go without using that wrist it is not as bad.  Past Medical History:  Diagnosis Date   ADD (attention deficit disorder)    Allergic rhinitis    Anxiety    Asthma    Depression    DR. KAUR   Insomnia    Migraine    Other abnormal glucose    SEASONAL ALLERGIES   Panic attacks    Strabismic amblyopia of right eye     Past Surgical History:  Procedure Laterality Date   STRABISMUS SURGERY Bilateral 01/04/2018    Family History  Problem Relation Age of Onset   Healthy Mother    Cancer Mother        BASEL CELL 81   Breast cancer Mother 31       DCIS   Healthy Father    Hyperlipidemia Father    Cancer Father        BASEL CELL 73   ADD / ADHD Father    Healthy Sister    Depression Sister    Liver cancer Maternal Grandmother    Cancer Maternal Grandmother        COLON   Schizophrenia Maternal Grandmother    Dementia Maternal Grandfather     Social History   Socioeconomic History   Marital status:  Married    Spouse name: [name]   Number of children: 0   Years of education: 16   Highest education level: Not on file  Occupational History   Occupation: NURSE  Tobacco Use   Smoking status: Never   Smokeless tobacco: Never  Vaping Use   Vaping Use: Never used  Substance and Sexual Activity   Alcohol use: Yes    Alcohol/week: 1.0 - 2.0 standard drink    Types: 1 - 2 Cans of beer per week    Comment: occasionally   Drug use: No   Sexual activity: Yes    Partners: Male    Birth control/protection: Inserts    Comment: Nuvaring  Other Topics Concern   Not on file  Social History Narrative   Not on file   Social Determinants of Health   Financial Resource Strain: Not on file  Food Insecurity: Not on file  Transportation Needs: Not on file  Physical Activity: Not on file  Stress: Not on file  Social Connections: Not on file  Intimate Partner Violence: Not on file    Outpatient Medications Prior  to Visit  Medication Sig Dispense Refill   albuterol (VENTOLIN HFA) 108 (90 Base) MCG/ACT inhaler Inhale 2 puffs into the lungs as needed. 1 each 5   cetirizine (ZYRTEC) 10 MG tablet Take 10 mg by mouth daily.     CRANBERRY PO Take by mouth.     Desvenlafaxine Succinate ER 25 MG TB24 TAKE 25 MG BY MOUTH EVERY MORNING. 90 tablet 0   etonogestrel-ethinyl estradiol (NUVARING) 0.12-0.015 MG/24HR vaginal ring Insert vaginally and leave in place for 3 consecutive weeks, then remove for 1 week. 3 each 3   fluticasone (FLONASE) 50 MCG/ACT nasal spray Place into both nostrils daily.     nitrofurantoin (MACRODANTIN) 100 MG capsule Take 100 mg by mouth as needed.     Prenatal Vit-Fe Fumarate-FA (PRENATAL VITAMINS) 28-0.8 MG TABS Take by mouth.     rizatriptan (MAXALT) 10 MG tablet Take 1 tablet (10 mg total) by mouth as needed for migraine. May repeat in 2 hours if needed 10 tablet 0   No facility-administered medications prior to visit.    Allergies  Allergen Reactions   Banana  Anaphylaxis   Other Anaphylaxis    Chickpeas   Augmentin [Amoxicillin-Pot Clavulanate] Hives   Codeine     Other reaction(s): Vomiting   Hydrocodone Other (See Comments)   Penicillins Hives   Sudafed [Pseudoephedrine Hcl]    Tape Rash    Review of Systems  Respiratory:  Negative for shortness of breath and wheezing.   Cardiovascular:  Negative for chest pain.  Gastrointestinal:  Positive for abdominal distention and constipation. Negative for abdominal pain, anal bleeding, blood in stool, diarrhea, nausea, rectal pain and vomiting.  Musculoskeletal:  Positive for arthralgias, back pain and joint swelling.  Skin:  Negative for rash.      Objective:    Physical Exam Constitutional:      General: She is not in acute distress.    Appearance: She is well-developed.  HENT:     Head: Normocephalic and atraumatic.     Right Ear: Hearing normal.     Left Ear: Hearing normal.     Nose: Nose normal.  Eyes:     General: Lids are normal. No scleral icterus.       Right eye: No discharge.        Left eye: No discharge.     Conjunctiva/sclera: Conjunctivae normal.  Cardiovascular:     Rate and Rhythm: Normal rate and regular rhythm.     Heart sounds: Normal heart sounds.  Pulmonary:     Effort: Pulmonary effort is normal. No respiratory distress.     Breath sounds: Normal breath sounds.  Abdominal:     General: Bowel sounds are normal.     Palpations: Abdomen is soft.  Musculoskeletal:        General: Normal range of motion.  Skin:    Findings: No lesion or rash.  Neurological:     Mental Status: She is alert and oriented to person, place, and time.  Psychiatric:        Speech: Speech normal.        Behavior: Behavior normal.        Thought Content: Thought content normal.    BP 124/85 (BP Location: Right Arm, Patient Position: Sitting, Cuff Size: Normal)   Pulse 87   Temp 98.4 F (36.9 C) (Oral)   Wt 116 lb (52.6 kg)   SpO2 99%   BMI 20.55 kg/m  Wt Readings from  Last 3 Encounters:  08/25/20  116 lb (52.6 kg)  03/04/20 112 lb (50.8 kg)  12/12/19 114 lb (51.7 kg)    Health Maintenance Due  Topic Date Due   INFLUENZA VACCINE  08/25/2020    There are no preventive care reminders to display for this patient.   No results found for: TSH Lab Results  Component Value Date   WBC 7.1 04/05/2017   HGB 14.3 04/05/2017   HCT 43.0 04/05/2017   MCV 88 04/05/2017   PLT 291 04/05/2017   Lab Results  Component Value Date   NA 134 (L) 10/23/2015   K 3.3 (L) 10/23/2015   CO2 23 10/23/2015   GLUCOSE 125 (H) 10/23/2015   BUN 11 10/23/2015   CREATININE 0.90 10/23/2015   CALCIUM 9.4 10/23/2015   ANIONGAP 8 10/23/2015   No results found for: CHOL No results found for: HDL No results found for: LDLCALC No results found for: TRIG No results found for: CHOLHDL No results found for: ELFY1O     Assessment & Plan:   1. Allergy, initial encounter Has a history of allergy to bananas and recently reacted when giving a patient some Mycostatin that has banana flavoring. Reaction was scratchy throat and wheezing. Has taken Zyrtec or Benadryl with Albuterol with reactions and it quickly resolved. Advised to notify supervisor and avoid exposure.  2. Sprain of right wrist, initial encounter Had pain int the right wrist after pulling some weeds a month ago. 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 Recommend using wrist splint and NSAID for the next 2 weeks. Recheck if no better.  3. Constipation, unspecified constipation type Changed SSRI to an SNRI per psychiatrist and has noticed more dry stools. Encouraged to increase water intake and use fiber supplement or stool softener. If needed, may use Miralax prn.      Adline Peals, CMA  I, Dortha Kern, PA-C, have reviewed all documentation for this visit. The documentation on 08/25/20 for the exam, diagnosis, procedures, and orders are all accurate and  complete.

## 2020-09-02 ENCOUNTER — Other Ambulatory Visit: Payer: Self-pay

## 2020-09-02 ENCOUNTER — Ambulatory Visit: Payer: BC Managed Care – PPO | Admitting: Psychiatry

## 2020-09-02 ENCOUNTER — Encounter: Payer: Self-pay | Admitting: Psychiatry

## 2020-09-02 VITALS — BP 165/80 | HR 82

## 2020-09-02 DIAGNOSIS — F419 Anxiety disorder, unspecified: Secondary | ICD-10-CM

## 2020-09-02 DIAGNOSIS — F5101 Primary insomnia: Secondary | ICD-10-CM | POA: Diagnosis not present

## 2020-09-02 DIAGNOSIS — F3341 Major depressive disorder, recurrent, in partial remission: Secondary | ICD-10-CM

## 2020-09-02 MED ORDER — DESVENLAFAXINE SUCCINATE ER 25 MG PO TB24: 25.0000 mg | ORAL_TABLET | Freq: Every morning | ORAL | 0 refills | Status: DC

## 2020-09-02 NOTE — Progress Notes (Signed)
Debra Garza [id number] 1981-01-18 40 y.o.  Subjective:   Patient ID:  Debra Garza is a 40 y.o. (DOB 1980/08/26) female.  Chief Complaint:  Chief Complaint  Patient presents with   Follow-up    ADHD, anxiety, depression    HPI Debra Garza presents to the office today for follow-up of anxiety, depression, and ADHD. She is transitioning out of trauma therapy and seeing a secondary therapist.   She reports that Pristiq may be helping some with anxiety and depression. She reports that her motivation has improved. Has had some difficulty with getting up in the morning. Energy has improved and takes less time to get going. She notices some slight improvement in focus and has been able to use organization systems. She reports that they have been working on organizing their household.   She reports "the only time I feel it (depression), is when we are having a fight or something is going on." She reports that she continues to have some low level generalized anxiety. She notices worry and anxious thoughts. Has anxiety with making phone calls. She can make phone calls at work without anxiety. Sleeping ok. Some difficulty winding down and then falls asleep immediately when she lays down. Appetite has been ok. Typically awakens hungry. Denies SI.   She reports that relationship with husband has been improving.   Work has been going well. 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   Enjoys cooking.   Past medication trials: Vyvanse-effective Dexedrine-effective Adderall- mood swings, tearfulness and irritability Methylphenidate-effective for only brief periods of time Concerta-increased blood pressure Strattera-increased blood pressure. Ineffective.  Ophelia Charter PM- Minimally effective Clonidine- Severe drowsiness Mirtazapine-low energy Wellbutrin XL-helpful for mood at 150 mg daily.  Had increased anxiety at 300 mg dose. Trintellix-Has been helpful for mood and  anxiety.  Prozac Pristiq   PHQ2-9    Flowsheet Row Office Visit from 12/02/2017 in [facility]  PHQ-2 Total Score 0        Review of Systems:  Review of Systems  Musculoskeletal:  Negative for gait problem.  Neurological:        No worsening in headaches  Psychiatric/Behavioral:         Please refer to HPI   Medications: I have reviewed the patient's current medications.  Current Outpatient Medications  Medication Sig Dispense Refill   cetirizine (ZYRTEC) 10 MG tablet Take 10 mg by mouth daily.     CRANBERRY PO Take by mouth.     etonogestrel-ethinyl estradiol (NUVARING) 0.12-0.015 MG/24HR vaginal ring Insert vaginally and leave in place for 3 consecutive weeks, then remove for 1 week. 3 each 3   fluticasone (FLONASE) 50 MCG/ACT nasal spray Place into both nostrils daily.     nitrofurantoin (MACRODANTIN) 100 MG capsule Take 100 mg by mouth as needed.     Prenatal Vit-Fe Fumarate-FA (PRENATAL VITAMINS) 28-0.8 MG TABS Take by mouth.     albuterol (VENTOLIN HFA) 108 (90 Base) MCG/ACT inhaler Inhale 2 puffs into the lungs as needed. 1 each 5   Desvenlafaxine Succinate ER 25 MG TB24 Take 25 mg by mouth every morning. 90 tablet 0   rizatriptan (MAXALT) 10 MG tablet Take 1 tablet (10 mg total) by mouth as needed for migraine. May repeat in 2 hours if needed 10 tablet 0   No current facility-administered medications for this visit.    Medication Side Effects: Other: Constipation  Allergies:  Allergies  Allergen Reactions   Banana Anaphylaxis   Other Anaphylaxis  Chickpeas   Augmentin [Amoxicillin-Pot Clavulanate] Hives   Codeine     Other reaction(s): Vomiting   Hydrocodone Other (See Comments)   Penicillins Hives   Sudafed [Pseudoephedrine Hcl]    Tape Rash    Past Medical History:  Diagnosis Date   ADD (attention deficit disorder)    Allergic rhinitis    Anxiety    Asthma    Depression    DR. KAUR   Insomnia    Migraine    Other abnormal  glucose    SEASONAL ALLERGIES   Panic attacks    Strabismic amblyopia of right eye     Past Medical History, Surgical history, Social history, and Family history were reviewed and updated as appropriate.   Please see review of systems for further details on the patient's review from today.   Objective:   Physical Exam:  BP (!) 165/80   Pulse 82   Physical Exam Constitutional:      General: She is not in acute distress. Musculoskeletal:        General: No deformity.  Neurological:     Mental Status: She is alert and oriented to person, place, and time.     Coordination: Coordination normal.  Psychiatric:        Attention and Perception: Attention and perception normal. She does not perceive auditory or visual hallucinations.        Mood and Affect: Mood normal. Mood is not anxious or depressed. Affect is not labile, blunt, angry or inappropriate.        Speech: Speech normal.        Behavior: Behavior normal.        Thought Content: Thought content normal. Thought content is not paranoid or delusional. Thought content does not include homicidal or suicidal ideation. Thought content does not include homicidal or suicidal plan.        Cognition and Memory: Cognition and memory normal.        Judgment: Judgment normal.     Comments: Insight intact    Lab Review:     Component Value Date/Time   NA 134 (L) 10/23/2015 0146   K 3.3 (L) 10/23/2015 0146   CL 103 10/23/2015 0146   CO2 23 10/23/2015 0146   GLUCOSE 125 (H) 10/23/2015 0146   BUN 11 10/23/2015 0146   CREATININE 0.90 10/23/2015 0146   CALCIUM 9.4 10/23/2015 0146   GFRNONAA >60 10/23/2015 0146   GFRAA >60 10/23/2015 0146       Component Value Date/Time   WBC 7.1 04/05/2017 1206   WBC 9.2 10/23/2015 0146   RBC 4.87 04/05/2017 1206   RBC 5.42 (H) 10/23/2015 0146   HGB 14.3 04/05/2017 1206   HCT 43.0 04/05/2017 1206   PLT 291 04/05/2017 1206   MCV 88 04/05/2017 1206   MCH 29.4 04/05/2017 1206   MCH 29.6  10/23/2015 0146   MCHC 33.3 04/05/2017 1206   MCHC 34.9 10/23/2015 0146   RDW 12.8 04/05/2017 1206   LYMPHSABS 2.9 04/05/2017 1206   EOSABS 0.3 04/05/2017 1206   BASOSABS 0.0 04/05/2017 1206    No results found for: POCLITH, LITHIUM   No results found for: PHENYTOIN, PHENOBARB, VALPROATE, CBMZ   .res Assessment: Plan:    Patient seen for 30 minutes and time spent reviewing current signs and symptoms and response to Pristiq.  Patient reports that she has experienced an improvement in mood and anxiety signs and symptoms with combination of Pristiq, marital counseling, and individual counseling.  Discussed  that overall mood and anxiety signs and symptoms are controlled at this time and she is tolerating Pristiq without any significant side effects.  Will therefore continue Pristiq 25 mg daily for anxiety and depression. She reports that sleep has improved and treatment is therefore not indicated at this time. Recommend continuing individual and couples counseling. Patient to follow-up in 4 months or sooner if clinically indicated. Patient advised to contact office with any questions, adverse effects, or acute worsening in signs and symptoms.   Debra was seen today for follow-up.  Diagnoses and all orders for this visit:  Recurrent major depressive disorder, in partial remission (HCC) -     Desvenlafaxine Succinate ER 25 MG TB24; Take 25 mg by mouth every morning.  Anxiety -     Desvenlafaxine Succinate ER 25 MG TB24; Take 25 mg by mouth every morning.  Primary insomnia    Please see After Visit Summary for patient specific instructions.  Future Appointments  Date Time Provider Department Center  01/02/2021  9:00 AM Corie Chiquito, PMHNP [facility] None    No orders of the defined types were placed in this encounter.   -------------------------------

## 2020-10-02 ENCOUNTER — Encounter: Payer: Self-pay | Admitting: Family Medicine

## 2020-12-15 ENCOUNTER — Other Ambulatory Visit (HOSPITAL_COMMUNITY)
Admission: RE | Admit: 2020-12-15 | Discharge: 2020-12-15 | Disposition: A | Discharge status: Home / Self Care | Payer: BC Managed Care – PPO | Source: Ambulatory Visit | Attending: Obstetrics | Admitting: Obstetrics

## 2020-12-15 ENCOUNTER — Encounter: Payer: Self-pay | Admitting: Obstetrics

## 2020-12-15 ENCOUNTER — Other Ambulatory Visit: Payer: Self-pay

## 2020-12-15 ENCOUNTER — Ambulatory Visit (INDEPENDENT_AMBULATORY_CARE_PROVIDER_SITE_OTHER): Payer: BC Managed Care – PPO | Admitting: Obstetrics

## 2020-12-15 VITALS — BP 130/90 | Ht 63.0 in | Wt 115.0 lb

## 2020-12-15 DIAGNOSIS — Z124 Encounter for screening for malignant neoplasm of cervix: Secondary | ICD-10-CM | POA: Insufficient documentation

## 2020-12-15 DIAGNOSIS — Z3044 Encounter for surveillance of vaginal ring hormonal contraceptive device: Secondary | ICD-10-CM

## 2020-12-15 DIAGNOSIS — Z01419 Encounter for gynecological examination (general) (routine) without abnormal findings: Secondary | ICD-10-CM | POA: Diagnosis not present

## 2020-12-15 MED ORDER — ETONOGESTREL-ETHINYL ESTRADIOL 0.12-0.015 MG/24HR VA RING
VAGINAL_RING | VAGINAL | 3 refills | Status: DC
Start: 2020-12-15 — End: 2022-06-11

## 2020-12-15 NOTE — Progress Notes (Signed)
Gynecology Annual Exam   PCP: Chrismon, Jodell Cipro, PA-C (Inactive)  Chief Complaint:  Chief Complaint  Patient presents with   Gynecologic Exam    No concerns    History of Present Illness: Patient is a 40 y.o. G1P0010 presents for annual exam. The patient has no complaints today. 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 She had a needle stick accident at work shortly ago, and had to be tested and treated as her patient is HIV positive.  She is partnered, has a hx of ADD and ome previous trauma that she is treating with therapy. She denies SI today, although her PHQ and GAD are elevated.  LMP: Patient's last menstrual period was 12/10/2020 (exact date). Average Interval: regular, 28 days Duration of flow: 5 days Heavy Menses: no Clots: no Intermenstrual Bleeding: no Postcoital Bleeding: no Dysmenorrhea: no  The patient is sexually active. She currently uses NuvaRing vaginal inserts for contraception. She denies dyspareunia.  The patient does perform self breast exams.  There is no notable family history of breast or ovarian cancer in her family.  The patient wears seatbelts: yes.   The patient has regular exercise: yes.    The patient  is currently under treatment for   current symptoms of depression, as well as some trauma therapy.   Review of Systems: Review of Systems  Constitutional: Negative.   HENT: Negative.    Eyes: Negative.   Cardiovascular: Negative.   Gastrointestinal: Negative.   Genitourinary: Negative.   Skin: Negative.   Neurological: Negative.   Endo/Heme/Allergies: Negative.   Psychiatric/Behavioral:  Positive for depression. The patient is nervous/anxious.        PHQ score is 12 GADscore is also 12   Past Medical History:  Patient Active Problem List   Diagnosis Date Noted   S/P eye surgery, follow-up exam 01/12/2018   Attention deficit hyperactivity disorder, combined type 11/09/2017   Anxiety 11/09/2017   Insomnia 11/09/2017   Intermittent  alternating esotropia 10/12/2017   Diplopia 10/04/2017   Adjustment disorder with depressed mood 12/16/2006    ASSESSMENT: Stable. Continue medication and monitor for changes.    Attention deficit hyperactivity disorder (ADHD), predominantly inattentive type 10/29/2005    ASSESSMENT: No problem coming off the Clonidine. Has decided to change school workload to reduce problems. Will try to find a psychiatrist to treat adult ADD. PREVENTIVE COUNSELING: The patient was counseled. RETURN VISIT: Patient is to return after: outside consultation completed. Patient is to return on a prn basis. Electronically Signed by: Dortha Kern, PA-C on Friday, March 29, 2008 10:15 am    Acute onset aura migraine 10/29/2005    Past Surgical History:  Past Surgical History:  Procedure Laterality Date   STRABISMUS SURGERY Bilateral 01/04/2018    Gynecologic History:  Patient's last menstrual period was 12/10/2020 (exact date). Contraception: NuvaRing vaginal inserts Last Pap: Results were: no abnormalities   Obstetric History: G1P0010  Family History:  Family History  Problem Relation Age of Onset   Healthy Mother    Cancer Mother        BASEL CELL 54   Breast cancer Mother 33       DCIS   Healthy Father    Hyperlipidemia Father    Cancer Father        BASEL CELL 33   ADD / ADHD Father    Healthy Sister    Depression Sister    Liver cancer Maternal Grandmother    Cancer Maternal Grandmother  COLON   Schizophrenia Maternal Grandmother    Dementia Maternal Grandfather     Social History:  Social History   Socioeconomic History   Marital status: Married    Spouse name: [name]   Number of children: 0   Years of education: 16   Highest education level: Not on file  Occupational History   Occupation: NURSE  Tobacco Use   Smoking status: Never   Smokeless tobacco: Never  Vaping Use   Vaping Use: Never used  Substance and Sexual Activity   Alcohol use: Yes    Alcohol/week: 1.0  - 2.0 standard drink    Types: 1 - 2 Cans of beer per week    Comment: occasionally   Drug use: No   Sexual activity: Yes    Partners: Male    Birth control/protection: Inserts    Comment: Nuvaring  Other Topics Concern   Not on file  Social History Narrative   Not on file   Social Determinants of Health   Financial Resource Strain: Not on file  Food Insecurity: Not on file  Transportation Needs: Not on file  Physical Activity: Not on file  Stress: Not on file  Social Connections: Not on file  Intimate Partner Violence: Not on file    Allergies:  Allergies  Allergen Reactions   Banana Anaphylaxis   Other Anaphylaxis    Chickpeas   Augmentin [Amoxicillin-Pot Clavulanate] Hives   Codeine     Other reaction(s): Vomiting   Hydrocodone Other (See Comments)   Penicillins Hives   Sudafed [Pseudoephedrine Hcl]    Tape Rash    Medications: Prior to Admission medications   Medication Sig Start Date End Date Taking? Authorizing Provider  albuterol (VENTOLIN HFA) 108 (90 Base) MCG/ACT inhaler Inhale 2 puffs into the lungs as needed. 07/10/20  Yes Chrismon, Jodell Cipro, PA-C  cetirizine (ZYRTEC) 10 MG tablet Take 10 mg by mouth daily.   Yes [provider]  CRANBERRY PO Take by mouth.   Yes [provider]  Desvenlafaxine Succinate ER 25 MG TB24 Take 25 mg by mouth every morning. 09/02/20  Yes Corie Chiquito, PMHNP  fluticasone (FLONASE) 50 MCG/ACT nasal spray Place into both nostrils daily.   Yes [provider]  nitrofurantoin (MACRODANTIN) 100 MG capsule Take 100 mg by mouth as needed.   Yes [provider]  Prenatal Vit-Fe Fumarate-FA (PRENATAL VITAMINS) 28-0.8 MG TABS Take by mouth.   Yes [provider]  rizatriptan (MAXALT) 10 MG tablet Take 1 tablet (10 mg total) by mouth as needed for migraine. May repeat in 2 hours if needed 11/11/17  Yes Chrismon, Jodell Cipro, PA-C  etonogestrel-ethinyl estradiol (NUVARING) 0.12-0.015 MG/24HR  vaginal ring Insert vaginally and leave in place for 3 consecutive weeks, then remove for 1 week. 12/15/20   Mirna Mires, CNM    Physical Exam Vitals: Blood pressure 130/90, height 5\' 3"  (1.6 m), weight 115 lb (52.2 kg), last menstrual period 12/10/2020.  General: NAD HEENT: normocephalic, anicteric Thyroid: no enlargement, no palpable nodules Pulmonary: No increased work of breathing, CTAB Cardiovascular: RRR, distal pulses 2+ Breast: Breast symmetrical, no tenderness, no palpable nodules or masses, no skin or nipple retraction present, no nipple discharge.  No axillary or supraclavicular lymphadenopathy. Abdomen: NABS, soft, non-tender, non-distended.  Umbilicus without lesions.  No hepatomegaly, splenomegaly or masses palpable. No evidence of hernia  Genitourinary:  External: Normal external female genitalia.  Normal urethral meatus, normal Bartholin's and Skene's glands.    Vagina: Normal vaginal mucosa,  no evidence of prolapse.    Cervix: Grossly normal in appearance, no bleeding  Uterus: Non-enlarged, mobile, normal contour.  No CMT  Adnexa: ovaries non-enlarged, no adnexal masses  Rectal: deferred  Lymphatic: no evidence of inguinal lymphadenopathy Extremities: no edema, erythema, or tenderness Neurologic: Grossly intact Psychiatric: mood appropriate, affect full  Female chaperone present for pelvic and breast  portions of the physical exam    Assessment: 40 y.o. G1P0010 routine annual exam  Plan: Problem List Items Addressed This Visit   None Visit Diagnoses     Cervical cancer screening    -  Primary   Relevant Orders   Cytology - PAP   Encounter for surveillance of vaginal ring hormonal contraceptive device       Relevant Medications   etonogestrel-ethinyl estradiol (NUVARING) 0.12-0.015 MG/24HR vaginal ring   Women's annual routine gynecological examination           2) STI screening  wasoffered and declined  2)  ASCCP guidelines and rational  discussed.  Patient opts for every 3 years screening interval  3) Contraception - the patient is currently using  NuvaRing vaginal inserts.  She is happy with her current form of contraception and plans to continue  4) Routine healthcare maintenance including cholesterol, diabetes screening discussed managed by PCP  5) Return in about 1 year (around 12/15/2021) for annual.  Discussed her PHQ score and GAD. She indicated some Sis, but clarifies that today she is not having any SIs, and that once she had a fleeting thought of self harm which really scared her and she has not had thoughts like this since. Strongly  counseled to contact family, a provider, or therapist should this reoccur.   Mirna Mires, CNM  12/15/2020 3:54 PM   [facility], [facility] 12/15/2020, 3:45 PM

## 2020-12-23 LAB — CYTOLOGY - PAP

## 2020-12-30 ENCOUNTER — Other Ambulatory Visit: Payer: Self-pay | Admitting: Obstetrics

## 2020-12-30 DIAGNOSIS — R87612 Low grade squamous intraepithelial lesion on cytologic smear of cervix (LGSIL): Secondary | ICD-10-CM

## 2020-12-30 NOTE — Progress Notes (Signed)
Called patient to let her know her pap smear came back LGSIL. Her last two have been NILM, so recommended colpscopy. Order has been placed for her to have this done in a month or so.  Mirna Mires, CNM  12/30/2020 12:59 PM

## 2021-01-02 ENCOUNTER — Other Ambulatory Visit: Payer: Self-pay

## 2021-01-02 ENCOUNTER — Ambulatory Visit: Payer: BC Managed Care – PPO | Admitting: Psychiatry

## 2021-01-02 ENCOUNTER — Encounter: Payer: Self-pay | Admitting: Psychiatry

## 2021-01-02 DIAGNOSIS — F419 Anxiety disorder, unspecified: Secondary | ICD-10-CM | POA: Diagnosis not present

## 2021-01-02 DIAGNOSIS — F3341 Major depressive disorder, recurrent, in partial remission: Secondary | ICD-10-CM

## 2021-01-02 MED ORDER — DESVENLAFAXINE SUCCINATE ER 50 MG PO TB24
50.0000 mg | ORAL_TABLET | Freq: Every morning | ORAL | 0 refills | Status: DC
Start: 2021-01-02 — End: 2021-03-12

## 2021-01-02 NOTE — Progress Notes (Signed)
Debra Garza [id number] Nov 08, 1980 40 y.o.  Subjective:   Patient ID:  Debra Garza is a 40 y.o. (DOB 1980/03/05) female.  Chief Complaint:  Chief Complaint  Patient presents with   Depression   Anxiety    HPI [name] Pecinich Passarella presents to the office today for follow-up of anxiety, depression, and ADHD.   She had a needle stick accident at work from an HIV pt and has had to take HIV treatment to prevent infection.   Working at [employer] unit. She reports that she has been using different tools to help manage. Has not been tardy at new job. Reports that she has been consistent with medication.   Reports "low level anxiety and still having low depression." Notices some worsening mood around this time of year. Energy and motivation have been low. Denies panic attacks. Sleeping well. Appetite has been ok. Denies SI.   Reports improved relationship with husband.   Working with trauma therapist. Working through feelings of self-doubt and negative self-talk.   Past medication trials: Vyvanse-effective Dexedrine-effective Adderall- mood swings, tearfulness and irritability Methylphenidate-effective for only brief periods of time Concerta-increased blood pressure Strattera-increased blood pressure. Ineffective.  Ophelia Charter PM- Minimally effective Clonidine- Severe drowsiness Mirtazapine-low energy Wellbutrin XL-helpful for mood at 150 mg daily.  Had increased anxiety at 300 mg dose. Trintellix-Has been helpful for mood and anxiety.  Prozac Pristiq    PHQ2-9    Flowsheet Row Office Visit from 12/02/2017 in [facility]  PHQ-2 Total Score 0        Review of Systems:  Review of Systems  Gastrointestinal:  Negative for constipation.  Musculoskeletal:  Negative for gait problem.       Injured wrist with gardening  Neurological:  Negative for tremors.  Psychiatric/Behavioral:         Please refer to HPI   Medications:  I have reviewed the patient's current medications.  Current Outpatient Medications  Medication Sig Dispense Refill   albuterol (VENTOLIN HFA) 108 (90 Base) MCG/ACT inhaler Inhale 2 puffs into the lungs as needed. 1 each 5   Aspirin-Acetaminophen-Caffeine (GOODY HEADACHE PO) Take by mouth.     cetirizine (ZYRTEC) 10 MG tablet Take 10 mg by mouth daily.     CRANBERRY PO Take by mouth.     etonogestrel-ethinyl estradiol (NUVARING) 0.12-0.015 MG/24HR vaginal ring Insert vaginally and leave in place for 3 consecutive weeks, then remove for 1 week. 3 each 3   fluticasone (FLONASE) 50 MCG/ACT nasal spray Place into both nostrils daily.     nitrofurantoin (MACRODANTIN) 100 MG capsule Take 100 mg by mouth as needed.     Prenatal Vit-Fe Fumarate-FA (PRENATAL VITAMINS) 28-0.8 MG TABS Take by mouth.     desvenlafaxine (PRISTIQ) 50 MG 24 hr tablet Take 1 tablet (50 mg total) by mouth every morning. 90 tablet 0   rizatriptan (MAXALT) 10 MG tablet Take 1 tablet (10 mg total) by mouth as needed for migraine. May repeat in 2 hours if needed 10 tablet 0   No current facility-administered medications for this visit.    Medication Side Effects: None  Allergies:  Allergies  Allergen Reactions   Banana Anaphylaxis   Nystatin Anaphylaxis, Other (See Comments) and Shortness Of Breath   Other Anaphylaxis    Chickpeas   Augmentin [Amoxicillin-Pot Clavulanate] Hives   Codeine     Other reaction(s): Vomiting   Hydrocodone Other (See Comments)   Penicillins Hives   Sudafed [Pseudoephedrine Hcl]    Tape Rash  Past Medical History:  Diagnosis Date   ADD (attention deficit disorder)    Allergic rhinitis    Anxiety    Asthma    Depression    DR. KAUR   Insomnia    Migraine    Other abnormal glucose    SEASONAL ALLERGIES   Panic attacks    Strabismic amblyopia of right eye     Past Medical History, Surgical history, Social history, and Family history were reviewed and updated as appropriate.    Please see review of systems for further details on the patient's review from today.   Objective:   Physical Exam:  LMP 12/10/2020 (Exact Date)   Physical Exam Constitutional:      General: She is not in acute distress. Musculoskeletal:        General: No deformity.  Neurological:     Mental Status: She is alert and oriented to person, place, and time.     Coordination: Coordination normal.  Psychiatric:        Attention and Perception: Attention and perception normal. She does not perceive auditory or visual hallucinations.        Mood and Affect: Affect is not labile, blunt, angry or inappropriate.        Speech: Speech normal.        Behavior: Behavior normal.        Thought Content: Thought content normal. Thought content is not paranoid or delusional. Thought content does not include homicidal or suicidal ideation. Thought content does not include homicidal or suicidal plan.        Cognition and Memory: Cognition and memory normal.        Judgment: Judgment normal.     Comments: Insight intact Mood is mildly depressed and anxious    Lab Review:     Component Value Date/Time   NA 134 (L) 10/23/2015 0146   K 3.3 (L) 10/23/2015 0146   CL 103 10/23/2015 0146   CO2 23 10/23/2015 0146   GLUCOSE 125 (H) 10/23/2015 0146   BUN 11 10/23/2015 0146   CREATININE 0.90 10/23/2015 0146   CALCIUM 9.4 10/23/2015 0146   GFRNONAA >60 10/23/2015 0146   GFRAA >60 10/23/2015 0146       Component Value Date/Time   WBC 7.1 04/05/2017 1206   WBC 9.2 10/23/2015 0146   RBC 4.87 04/05/2017 1206   RBC 5.42 (H) 10/23/2015 0146   HGB 14.3 04/05/2017 1206   HCT 43.0 04/05/2017 1206   PLT 291 04/05/2017 1206   MCV 88 04/05/2017 1206   MCH 29.4 04/05/2017 1206   MCH 29.6 10/23/2015 0146   MCHC 33.3 04/05/2017 1206   MCHC 34.9 10/23/2015 0146   RDW 12.8 04/05/2017 1206   LYMPHSABS 2.9 04/05/2017 1206   EOSABS 0.3 04/05/2017 1206   BASOSABS 0.0 04/05/2017 1206    No results found  for: POCLITH, LITHIUM   No results found for: PHENYTOIN, PHENOBARB, VALPROATE, CBMZ   .res Assessment: Plan:     Patient seen for 30 minutes and time spent discussing residual mild anxiety and depressive signs and symptoms.  Discussed potential benefits, risks, and side effects of increasing Pristiq to 50 mg daily to further improve anxiety and depressive signs and symptoms since she has experienced partial improvement with 25 mg dose.  Patient agrees to a trial of Pristiq 50 mg daily.  Advised patient to contact office if she experiences adverse effects with 50 mg dose and prescription for 25 mg tablets could be sent to her  pharmacy. Recommend continuing psychotherapy. Patient to follow-up in 2 months or sooner if clinically indicated. Patient advised to contact office with any questions, adverse effects, or acute worsening in signs and symptoms.  [name] was seen today for depression and anxiety.  Diagnoses and all orders for this visit:  Anxiety -     desvenlafaxine (PRISTIQ) 50 MG 24 hr tablet; Take 1 tablet (50 mg total) by mouth every morning.  Recurrent major depressive disorder, in partial remission (HCC) -     desvenlafaxine (PRISTIQ) 50 MG 24 hr tablet; Take 1 tablet (50 mg total) by mouth every morning.    Please see After Visit Summary for patient specific instructions.  Future Appointments  Date Time Provider Department Center  03/05/2021  9:00 AM Corie Chiquito, PMHNP CP-CP None    No orders of the defined types were placed in this encounter.   -------------------------------

## 2021-01-05 ENCOUNTER — Other Ambulatory Visit: Payer: Self-pay | Admitting: Advanced Practice Midwife

## 2021-01-05 ENCOUNTER — Ambulatory Visit: Payer: Self-pay

## 2021-01-05 NOTE — Telephone Encounter (Signed)
  Chief Complaint: right elbow pain and mild swelling x 1.5 weeks Symptoms: swelling and hurts when she rotates her right arm, moderate pain Frequency: constant Pertinent Negatives: Patient denies neck pain, rash fever, numbness and tingling to right arm Disposition: [] ED /[] Urgent Care (no appt availability in office) / [x] Appointment(In office/virtual)/ []  [facility]/ [] Home Care/ [] Refused Recommended Disposition  Additional Notes: Called [facility] and appt scheduled Wednesday 0920 with PA.        Reason for Disposition  [1] MODERATE pain (e.g., interferes with normal activities) AND [2] present > 3 days  Answer Assessment - Initial Assessment Questions 1. ONSET: "When did the pain start?"     Over week ago 2. LOCATION: "Where is the pain located?"     right elbow 3. PAIN: "How bad is the pain?" (Scale 1-10; or mild, moderate, severe)   - MILD (1-3): doesn't interfere with normal activities.   - MODERATE (4-7): interferes with normal activities (e.g., work or school) or awakens from sleep.   - SEVERE (8-10): excruciating pain, unable to do any normal activities, unable to use arm at all.     Moderate  4. WORK OR EXERCISE: "Has there been any recent work or exercise that involved this part of the body?"     Yes hit banister on stairwell  5. CAUSE: "What do you think is causing the elbow pain?"     Hit elbow on banister 6. OTHER SYMPTOMS: "Do you have any other symptoms?" (e.g., neck pain, elbow swelling, rash, fever)     Hurts when rotated, mild swelling  Protocols used: Elbow Pain-A-AH

## 2021-01-06 NOTE — Progress Notes (Signed)
°  ° ° °  Established patient visit   Patient: Debra Garza   DOB: Oct 18, 1980   40 y.o. Female  MRN: [id number] Visit Date: 01/07/2021  Today's healthcare provider: Alfredia Ferguson, PA-C   Cc right elbow pain x1.5 weeks  Subjective    HPI  Debra Garza is a 40 y/o female who presents today with concerns over right elbow pain x 1.5 weeks. She states that she hit it on the banister in her house while carrying her dog. She has tenderness to the elbow, and pain with certain ROM. She has been icing occasionally and taking advil/tylenol as needed for pain.   Medications: Outpatient Medications Prior to Visit  Medication Sig   albuterol (VENTOLIN HFA) 108 (90 Base) MCG/ACT inhaler Inhale 2 puffs into the lungs as needed.   Aspirin-Acetaminophen-Caffeine (GOODY HEADACHE PO) Take by mouth.   cetirizine (ZYRTEC) 10 MG tablet Take 10 mg by mouth daily.   CRANBERRY PO Take by mouth.   desvenlafaxine (PRISTIQ) 50 MG 24 hr tablet Take 1 tablet (50 mg total) by mouth every morning.   etonogestrel-ethinyl estradiol (NUVARING) 0.12-0.015 MG/24HR vaginal ring Insert vaginally and leave in place for 3 consecutive weeks, then remove for 1 week.   fluticasone (FLONASE) 50 MCG/ACT nasal spray Place into both nostrils daily.   nitrofurantoin (MACRODANTIN) 100 MG capsule Take 100 mg by mouth as needed.   Prenatal Vit-Fe Fumarate-FA (PRENATAL VITAMINS) 28-0.8 MG TABS Take by mouth.   rizatriptan (MAXALT) 10 MG tablet Take 1 tablet (10 mg total) by mouth as needed for migraine. May repeat in 2 hours if needed   No facility-administered medications prior to visit.    Review of Systems  Constitutional:  Negative for fatigue and fever.  Musculoskeletal:  Positive for arthralgias.      Objective    LMP 12/10/2020 (Exact Date)  Blood pressure 132/77, pulse 83, temperature 98.3 F (36.8 C), temperature source Oral, height 5\' 3"  (1.6 m), weight 118 lb 6.4 oz (53.7 kg), last menstrual period 12/10/2020,  SpO2 98 %.   Physical Exam Constitutional:      Appearance: She is not ill-appearing.  Cardiovascular:     Rate and Rhythm: Normal rate.  Pulmonary:     Effort: Pulmonary effort is normal.  Musculoskeletal:     Right elbow: No swelling or effusion. Tenderness present in lateral epicondyle.     Comments: No limited ROM but pain ROM rt elbow  Neurological:     Mental Status: She is alert.     No results found for any visits on 01/07/21.  Assessment & Plan     Right elbow pain Tenderness to Rt lateral epicondyle, no limit to ROM but pain with ROM Ordered xray series  Will fu with results  I, 01/09/21, PA-C have reviewed all documentation for this visit. The documentation on  01/07/2021 for the exam, diagnosis, procedures, and orders are all accurate and complete.    01/09/2021, PA-C  [facility] [phone] (phone) [phone] (fax)  [facility]

## 2021-01-07 ENCOUNTER — Other Ambulatory Visit: Payer: Self-pay

## 2021-01-07 ENCOUNTER — Ambulatory Visit
Admission: RE | Admit: 2021-01-07 | Discharge: 2021-01-07 | Disposition: A | Discharge status: Home / Self Care | Payer: BC Managed Care – PPO | Attending: Physician Assistant | Admitting: Physician Assistant

## 2021-01-07 ENCOUNTER — Encounter: Payer: Self-pay | Admitting: Physician Assistant

## 2021-01-07 ENCOUNTER — Ambulatory Visit: Payer: BC Managed Care – PPO | Admitting: Physician Assistant

## 2021-01-07 ENCOUNTER — Ambulatory Visit
Admission: RE | Admit: 2021-01-07 | Discharge: 2021-01-07 | Disposition: A | Discharge status: Home / Self Care | Payer: BC Managed Care – PPO | Source: Ambulatory Visit | Attending: Physician Assistant | Admitting: Physician Assistant

## 2021-01-07 VITALS — BP 132/77 | HR 83 | Temp 98.3°F | Ht 63.0 in | Wt 118.4 lb

## 2021-01-07 DIAGNOSIS — S59901A Unspecified injury of right elbow, initial encounter: Secondary | ICD-10-CM | POA: Diagnosis not present

## 2021-01-07 DIAGNOSIS — M25521 Pain in right elbow: Secondary | ICD-10-CM

## 2021-01-09 ENCOUNTER — Encounter: Payer: Self-pay | Admitting: Physician Assistant

## 2021-01-20 ENCOUNTER — Other Ambulatory Visit: Payer: Self-pay | Admitting: Physician Assistant

## 2021-01-26 ENCOUNTER — Encounter: Payer: Self-pay | Admitting: Obstetrics

## 2021-01-27 NOTE — Telephone Encounter (Signed)
Called and left voicemail for patient to call back to be scheduled. 

## 2021-01-28 NOTE — Telephone Encounter (Signed)
Patient is scheduled for 02/24/21 with CRS 

## 2021-01-28 NOTE — Telephone Encounter (Signed)
Called and left voicemail for patient to call back to be scheduled. 

## 2021-01-30 ENCOUNTER — Other Ambulatory Visit: Payer: Self-pay | Admitting: Advanced Practice Midwife

## 2021-01-30 ENCOUNTER — Other Ambulatory Visit: Payer: Self-pay | Admitting: Obstetrics

## 2021-01-30 ENCOUNTER — Other Ambulatory Visit: Payer: Self-pay | Admitting: Physician Assistant

## 2021-01-30 MED ORDER — NITROFURANTOIN MACROCRYSTAL 100 MG PO CAPS: 100.0000 mg | ORAL_CAPSULE | ORAL | 6 refills | Status: AC | PRN

## 2021-01-30 NOTE — Telephone Encounter (Signed)
Pt calling for refill of nitrofurintoin for suppressive therapy.  [phone]

## 2021-01-30 NOTE — Telephone Encounter (Signed)
Patient has sent a mychart message to GYN. Will disregard request.

## 2021-02-17 ENCOUNTER — Other Ambulatory Visit: Payer: Self-pay | Admitting: Psychiatry

## 2021-02-17 DIAGNOSIS — F3341 Major depressive disorder, recurrent, in partial remission: Secondary | ICD-10-CM

## 2021-02-17 DIAGNOSIS — F419 Anxiety disorder, unspecified: Secondary | ICD-10-CM

## 2021-02-24 ENCOUNTER — Other Ambulatory Visit: Payer: Self-pay

## 2021-02-24 ENCOUNTER — Other Ambulatory Visit (HOSPITAL_COMMUNITY)
Admission: RE | Admit: 2021-02-24 | Discharge: 2021-02-24 | Disposition: A | Discharge status: Home / Self Care | Payer: BC Managed Care – PPO | Source: Ambulatory Visit | Attending: Obstetrics and Gynecology | Admitting: Obstetrics and Gynecology

## 2021-02-24 ENCOUNTER — Encounter: Payer: Self-pay | Admitting: Obstetrics and Gynecology

## 2021-02-24 ENCOUNTER — Ambulatory Visit (INDEPENDENT_AMBULATORY_CARE_PROVIDER_SITE_OTHER): Payer: BC Managed Care – PPO | Admitting: Obstetrics and Gynecology

## 2021-02-24 VITALS — BP 112/70 | Ht 63.0 in | Wt 116.2 lb

## 2021-02-24 DIAGNOSIS — R87612 Low grade squamous intraepithelial lesion on cytologic smear of cervix (LGSIL): Secondary | ICD-10-CM | POA: Insufficient documentation

## 2021-02-24 DIAGNOSIS — N870 Mild cervical dysplasia: Secondary | ICD-10-CM | POA: Diagnosis not present

## 2021-02-24 NOTE — Patient Instructions (Signed)
Colposcopy, Care After The following information offers guidance on how to care for yourself after your procedure. Your doctor may also give you more specific instructions. If you have problems or questions, contact your doctor. What can I expect after the procedure? If you did not have a sample of your tissue taken out (did not have a biopsy), you may only have some spotting of blood for a few days. You can go back to your normal activities. If you had a sample of your tissue taken out, it is common to have: Soreness and mild pain. These may last for a few days. Mild bleeding or fluid (discharge) coming from your vagina. The fluid will look dark and grainy. You may have this for a few days. The fluid may be caused by a liquid that was used during your procedure. You may need to wear a sanitary pad. Spotting of blood for at least 48 hours after the procedure. Follow these instructions at home: Medicines Take over-the-counter and prescription medicines only as told by your doctor. Ask your doctor what over-the-counter pain medicines and prescription medicines you can start taking again. This is very important if you take blood thinners. Activity For at least 3 days, or for as long as told by your doctor, avoid: Douching. Using tampons. Having sex. Return to your normal activities as told by your doctor. Ask your doctor what activities are safe for you. General instructions Ask your doctor if you may take baths, swim, or use a hot tub. You may take showers. If you use birth control (contraception), keep using it. Keep all follow-up visits. Contact a doctor if: You have a fever or chills. You faint or feel light-headed. Get help right away if: You bleed a lot from your vagina. A lot of bleeding means that the bleeding soaks through a pad in less than 1 hour. You have clumps of blood (blood clots) coming from your vagina. You have signs that could mean you have an infection. This may be fluid  coming from your vagina that is: Different than normal. Yellow. Bad-smelling. You have very bad pain or cramps in your lower belly that do not get better with medicine. Summary If you did not have a sample of your tissue taken out, you may only have some spotting of blood for a few days. You can go back to your normal activities. If you had a sample of your tissue taken out, it is common to have mild pain for a few days and spotting for 48 hours. Avoid douching, using tampons, and having sex for at least 3 days after the procedure or for as long as told. Get help right away if you have a lot of bleeding, very bad pain, or signs of infection. This information is not intended to replace advice given to you by your health care provider. Make sure you discuss any questions you have with your health care provider. Document Revised: 06/08/2020 Document Reviewed: 06/08/2020 Elsevier Patient Education  2022 Elsevier Inc.  

## 2021-02-24 NOTE — Progress Notes (Signed)
° °  GYNECOLOGY CLINIC COLPOSCOPY PROCEDURE NOTE  41 y.o. G1P0010 here for colposcopy for low-grade squamous intraepithelial neoplasia (LGSIL - encompassing HPV,mild dysplasia,CIN I)  pap smear on 12/15/2020. Discussed underlying role for HPV infection in the development of cervical dysplasia, its natural history and progression/regression, need for surveillance.  Is the patient  pregnant: No LMP: Patient's last menstrual period was 02/04/2021. Smoking status:  reports that she has never smoked. She has never used smokeless tobacco.   Patient given informed consent, signed copy in the chart, time out was performed.  The patient was position in dorsal lithotomy position. Speculum was placed the cervix was visualized.   After application of acetic acid colposcopic inspection of the cervix was undertaken.   Colposcopy adequate, full visualization of transformation zone: Yes acetowhite lesion(s) noted at 9 o'clock; corresponding biopsies obtained.   ECC specimen obtained:  Yes  All specimens were labeled and sent to pathology.   Patient was given post procedure instructions.  Will follow up pathology and manage accordingly.  Routine preventative health maintenance measures emphasized.  Physical Exam Genitourinary:      Adrian Prows MD [facility] 02/24/2021 4:04 PM

## 2021-02-26 LAB — SURGICAL PATHOLOGY

## 2021-03-02 ENCOUNTER — Encounter: Payer: Self-pay | Admitting: Obstetrics and Gynecology

## 2021-03-05 ENCOUNTER — Ambulatory Visit: Payer: BC Managed Care – PPO | Admitting: Psychiatry

## 2021-03-12 ENCOUNTER — Other Ambulatory Visit: Payer: Self-pay

## 2021-03-12 ENCOUNTER — Encounter: Payer: Self-pay | Admitting: Psychiatry

## 2021-03-12 ENCOUNTER — Ambulatory Visit: Payer: BC Managed Care – PPO | Admitting: Psychiatry

## 2021-03-12 DIAGNOSIS — F3341 Major depressive disorder, recurrent, in partial remission: Secondary | ICD-10-CM | POA: Diagnosis not present

## 2021-03-12 DIAGNOSIS — F419 Anxiety disorder, unspecified: Secondary | ICD-10-CM

## 2021-03-12 MED ORDER — DESVENLAFAXINE SUCCINATE ER 50 MG PO TB24: 50.0000 mg | ORAL_TABLET | Freq: Every morning | ORAL | 1 refills | Status: DC

## 2021-03-12 NOTE — Progress Notes (Signed)
Debra Garza [id number] July 23, 1980 41 y.o.  Subjective:   Patient ID:  Debra Garza is a 41 y.o. (DOB [birth year]) female.  Chief Complaint:  Chief Complaint  Patient presents with   Follow-up    Depression, anxiety, insomnia, ADHD    HPI [name] Pecinich Bridwell presents to the office today for follow-up of anxiety, depression, insomnia, and ADHD. She reports she and her husband have been getting along better and making progress in couples therapy. She is also seeing trauma therapist. She reports that the couples counselor asked her questions and this brought up some past traumatic events she was not aware of. Denies recurrent intrusive memories. Denies re-experiencing. Anxiety has been "better." Mood has been "stable." She reports that mood has improved. She reports occ irritability. Energy and motivation have been good. She reports that she is sometimes slow to get up and get moving. She has started a morning routine. She does some yoga, a work out, and reviews positive affirmations. Sleeping well. Appetite has been good. Denies anhedonia. Denies SI.   She reports improved organization. She reports concentration has improved.   She reports that work has been going better.   Past medication trials: Vyvanse-effective Dexedrine-effective Adderall- mood swings, tearfulness and irritability Methylphenidate-effective for only brief periods of time Concerta-increased blood pressure Strattera-increased blood pressure. Ineffective.  Ophelia Charter PM- Minimally effective Clonidine- Severe drowsiness Mirtazapine-low energy Wellbutrin XL-helpful for mood at 150 mg daily.  Had increased anxiety at 300 mg dose. Trintellix-Has been helpful for mood and anxiety.  Prozac Pristiq  PHQ2-9    Flowsheet Row Office Visit from 12/02/2017 in [facility]  PHQ-2 Total Score 0        Review of Systems:  Review of Systems  Gastrointestinal: Negative.   Genitourinary:         Had colposcopy recently  Musculoskeletal:  Negative for gait problem.  Neurological:  Positive for headaches. Negative for tremors.       Notices increased HA's around menstrual cycle  Psychiatric/Behavioral:         Please refer to HPI   Medications: I have reviewed the patient's current medications.  Current Outpatient Medications  Medication Sig Dispense Refill   albuterol (VENTOLIN HFA) 108 (90 Base) MCG/ACT inhaler Inhale 2 puffs into the lungs as needed. 1 each 5   Aspirin-Acetaminophen-Caffeine (GOODY HEADACHE PO) Take by mouth.     cetirizine (ZYRTEC) 10 MG tablet Take 10 mg by mouth daily.     CRANBERRY PO Take by mouth.     etonogestrel-ethinyl estradiol (NUVARING) 0.12-0.015 MG/24HR vaginal ring Insert vaginally and leave in place for 3 consecutive weeks, then remove for 1 week. 3 each 3   fluticasone (FLONASE) 50 MCG/ACT nasal spray Place into both nostrils as needed.     nitrofurantoin (MACRODANTIN) 100 MG capsule Take 1 capsule (100 mg total) by mouth as needed. 30 capsule 6   Prenatal Vit-Fe Fumarate-FA (PRENATAL VITAMINS) 28-0.8 MG TABS Take by mouth.     Probiotic Product (PROBIOTIC PO) Take by mouth.     rizatriptan (MAXALT) 10 MG tablet Take 1 tablet (10 mg total) by mouth as needed for migraine. May repeat in 2 hours if needed 10 tablet 0   desvenlafaxine (PRISTIQ) 50 MG 24 hr tablet Take 1 tablet (50 mg total) by mouth every morning. 90 tablet 1   No current facility-administered medications for this visit.    Medication Side Effects: Other: Low libido  Allergies:  Allergies  Allergen Reactions   Banana Anaphylaxis  Nystatin Anaphylaxis, Shortness Of Breath and Other (See Comments)    Banana Flavoring   Other Anaphylaxis    Chickpeas   Augmentin [Amoxicillin-Pot Clavulanate] Hives   Codeine     Other reaction(s): Vomiting   Hydrocodone Other (See Comments)   Penicillins Hives   Sudafed [Pseudoephedrine Hcl]    Tape Rash    Past Medical  History:  Diagnosis Date   ADD (attention deficit disorder)    Allergic rhinitis    Anxiety    Asthma    Depression    DR. KAUR   Insomnia    Migraine    Other abnormal glucose    SEASONAL ALLERGIES   Panic attacks    Strabismic amblyopia of right eye     Past Medical History, Surgical history, Social history, and Family history were reviewed and updated as appropriate.   Please see review of systems for further details on the patient's review from today.   Objective:   Physical Exam:  Wt 114 lb (51.7 kg)    BMI 20.19 kg/m   Physical Exam Constitutional:      General: She is not in acute distress. Musculoskeletal:        General: No deformity.  Neurological:     Mental Status: She is alert and oriented to person, place, and time.     Coordination: Coordination normal.  Psychiatric:        Attention and Perception: Attention and perception normal. She does not perceive auditory or visual hallucinations.        Mood and Affect: Mood normal. Mood is not anxious or depressed. Affect is not labile, blunt, angry or inappropriate.        Speech: Speech normal.        Behavior: Behavior normal.        Thought Content: Thought content normal. Thought content is not paranoid or delusional. Thought content does not include homicidal or suicidal ideation. Thought content does not include homicidal or suicidal plan.        Cognition and Memory: Cognition and memory normal.        Judgment: Judgment normal.     Comments: Insight intact    Lab Review:     Component Value Date/Time   NA 134 (L) 10/23/2015 0146   K 3.3 (L) 10/23/2015 0146   CL 103 10/23/2015 0146   CO2 23 10/23/2015 0146   GLUCOSE 125 (H) 10/23/2015 0146   BUN 11 10/23/2015 0146   CREATININE 0.90 10/23/2015 0146   CALCIUM 9.4 10/23/2015 0146   GFRNONAA >60 10/23/2015 0146   GFRAA >60 10/23/2015 0146       Component Value Date/Time   WBC 7.1 04/05/2017 1206   WBC 9.2 10/23/2015 0146   RBC 4.87 04/05/2017  1206   RBC 5.42 (H) 10/23/2015 0146   HGB 14.3 04/05/2017 1206   HCT 43.0 04/05/2017 1206   PLT 291 04/05/2017 1206   MCV 88 04/05/2017 1206   MCH 29.4 04/05/2017 1206   MCH 29.6 10/23/2015 0146   MCHC 33.3 04/05/2017 1206   MCHC 34.9 10/23/2015 0146   RDW 12.8 04/05/2017 1206   LYMPHSABS 2.9 04/05/2017 1206   EOSABS 0.3 04/05/2017 1206   BASOSABS 0.0 04/05/2017 1206    No results found for: POCLITH, LITHIUM   No results found for: PHENYTOIN, PHENOBARB, VALPROATE, CBMZ   .res Assessment: Plan:    Will continue current plan of care since target signs and symptoms are well controlled without any tolerability issues. Continue  Pristiq 50 mg po qd for anxiety and depression since she reports that increase in dose has been helpful for her mood and anxiety s/s without significant tolerability issues. Recommend continuing therapy.  Pt to follow-up in 6 months or sooner if clinically indicated.  Patient advised to contact office with any questions, adverse effects, or acute worsening in signs and symptoms.    [name] was seen today for follow-up.  Diagnoses and all orders for this visit:  Anxiety -     desvenlafaxine (PRISTIQ) 50 MG 24 hr tablet; Take 1 tablet (50 mg total) by mouth every morning.  Recurrent major depressive disorder, in partial remission (HCC) -     desvenlafaxine (PRISTIQ) 50 MG 24 hr tablet; Take 1 tablet (50 mg total) by mouth every morning.     Please see After Visit Summary for patient specific instructions.  Future Appointments  Date Time Provider Department Center  09/09/2021  9:00 AM Corie Chiquito, PMHNP CP-CP None    No orders of the defined types were placed in this encounter.   -------------------------------

## 2021-03-30 ENCOUNTER — Encounter: Payer: Self-pay | Admitting: Physician Assistant

## 2021-04-09 ENCOUNTER — Encounter: Payer: Self-pay | Admitting: Physician Assistant

## 2021-04-09 ENCOUNTER — Ambulatory Visit: Payer: BC Managed Care – PPO | Admitting: Physician Assistant

## 2021-04-09 ENCOUNTER — Other Ambulatory Visit: Payer: Self-pay

## 2021-04-09 VITALS — BP 123/83 | HR 84 | Ht 63.0 in | Wt 119.4 lb

## 2021-04-09 DIAGNOSIS — R400 Somnolence: Secondary | ICD-10-CM | POA: Insufficient documentation

## 2021-04-09 DIAGNOSIS — J302 Other seasonal allergic rhinitis: Secondary | ICD-10-CM | POA: Diagnosis not present

## 2021-04-09 DIAGNOSIS — R0683 Snoring: Secondary | ICD-10-CM | POA: Diagnosis not present

## 2021-04-09 MED ORDER — FEXOFENADINE HCL 180 MG PO TABS: 180.0000 mg | ORAL_TABLET | Freq: Every day | ORAL | 1 refills | Status: DC

## 2021-04-09 MED ORDER — AZELASTINE HCL 0.1 % NA SOLN: 1.0000 | Freq: Two times a day (BID) | NASAL | 2 refills | Status: DC

## 2021-04-09 NOTE — Assessment & Plan Note (Addendum)
High epworth sleepiness score ?Cbc/tsh/t4/cmp ordered ?Will hold off on sleep testing until prelim tests come back ?ddx sleep apnea, allergic rhinitis congestion, thyroid condition.  ?See allergy note ?

## 2021-04-09 NOTE — Assessment & Plan Note (Signed)
Switch from claritin to allegra daily.  ?Continue with flonase nasal spray daily and add azelastine ?Advised saline rinses before bed, continue with breathe right strips, humidifier.  ?

## 2021-04-09 NOTE — Progress Notes (Signed)
? ?I,Sha'taria Tyson,acting as a Education administrator for [employer], PA-C.,have documented all relevant documentation on the behalf of Debra Kirschner, PA-C,as directed by  Debra Kirschner, PA-C while in the presence of Debra Kirschner, PA-C. ? ?Established Patient Office Visit ? ?Subjective:  ?Patient ID: Debra Garza, female    DOB: 1980/09/14  Age: 40 y.o. MRN: [id number] ? ?CC:  ?Chief Complaint  ?Patient presents with  ? Snoring  ? ?Debra Garza is a 41 y/o female who presents today with new concern of snoring. Her husband reports that over the last three months she has been snoring loudly, when she never used to. Debra Garza reports excessive daytime sleepiness as well. She exercises routinely with yoga and going to the gym. She started wearing a breathe right nasal strip which helped some but she is still snoring and tired. Denies weight gain. ?She reports chronic allergy issues, takes a claritin daily with flonase. Reports frequent allergy exposure at work. Denies PND, cough, SOB.  ? ?Results of the Epworth flowsheet 04/09/2021  ?Sitting and reading 3  ?Watching TV 2  ?Sitting, inactive in a public place (e.g. a theatre or a meeting) 0  ?As a passenger in a car for an hour without a break 3  ?Lying down to rest in the afternoon when circumstances permit 3  ?Sitting and talking to someone 2  ?Sitting quietly after a lunch without alcohol 1  ?In a car, while stopped for a few minutes in traffic 2  ?Total score 16  ?  ?Past Medical History:  ?Diagnosis Date  ? ADD (attention deficit disorder)   ? Allergic rhinitis   ? Anxiety   ? Asthma   ? Depression   ? DR. KAUR  ? Insomnia   ? Migraine   ? Other abnormal glucose   ? SEASONAL ALLERGIES  ? Panic attacks   ? Strabismic amblyopia of right eye   ? ? ?Past Surgical History:  ?Procedure Laterality Date  ? STRABISMUS SURGERY Bilateral 01/04/2018  ? ? ?Family History  ?Problem Relation Age of Onset  ? Healthy Mother   ? Cancer Mother   ?     BASEL CELL 40  ? Breast cancer  Mother 39  ?     DCIS  ? Healthy Father   ? Hyperlipidemia Father   ? Cancer Father   ?     BASEL CELL 40  ? ADD / ADHD Father   ? Healthy Sister   ? Depression Sister   ? Liver cancer Maternal Grandmother   ? Cancer Maternal Grandmother   ?     COLON  ? Schizophrenia Maternal Grandmother   ? Dementia Maternal Grandfather   ? ? ?Social History  ? ?Socioeconomic History  ? Marital status: Married  ?  Spouse name: Marylyn Ishihara  ? Number of children: 0  ? Years of education: 51  ? Highest education level: Not on file  ?Occupational History  ? Occupation: NURSE  ?Tobacco Use  ? Smoking status: Never  ? Smokeless tobacco: Never  ?Vaping Use  ? Vaping Use: Never used  ?Substance and Sexual Activity  ? Alcohol use: Yes  ?  Alcohol/week: 1.0 - 2.0 standard drink  ?  Types: 1 - 2 Cans of beer per week  ?  Comment: occasionally  ? Drug use: No  ? Sexual activity: Yes  ?  Partners: Male  ?  Birth control/protection: Inserts  ?  Comment: Nuvaring  ?Other Topics Concern  ? Not on file  ?Social History  Narrative  ? Not on file  ? ?Social Determinants of Health  ? ?Financial Resource Strain: Not on file  ?Food Insecurity: Not on file  ?Transportation Needs: Not on file  ?Physical Activity: Not on file  ?Stress: Not on file  ?Social Connections: Not on file  ?Intimate Partner Violence: Not on file  ? ? ?Outpatient Medications Prior to Visit  ?Medication Sig Dispense Refill  ? albuterol (VENTOLIN HFA) 108 (90 Base) MCG/ACT inhaler Inhale 2 puffs into the lungs as needed. 1 each 5  ? Aspirin-Acetaminophen-Caffeine (GOODY HEADACHE PO) Take by mouth.    ? cetirizine (ZYRTEC) 10 MG tablet Take 10 mg by mouth daily.    ? CRANBERRY PO Take by mouth.    ? desvenlafaxine (PRISTIQ) 50 MG 24 hr tablet Take 1 tablet (50 mg total) by mouth every morning. 90 tablet 1  ? etonogestrel-ethinyl estradiol (NUVARING) 0.12-0.015 MG/24HR vaginal ring Insert vaginally and leave in place for 3 consecutive weeks, then remove for 1 week. 3 each 3  ? fluticasone  (FLONASE) 50 MCG/ACT nasal spray Place into both nostrils as needed.    ? nitrofurantoin (MACRODANTIN) 100 MG capsule Take 1 capsule (100 mg total) by mouth as needed. 30 capsule 6  ? Prenatal Vit-Fe Fumarate-FA (PRENATAL VITAMINS) 28-0.8 MG TABS Take by mouth.    ? Probiotic Product (PROBIOTIC PO) Take by mouth.    ? rizatriptan (MAXALT) 10 MG tablet Take 1 tablet (10 mg total) by mouth as needed for migraine. May repeat in 2 hours if needed 10 tablet 0  ? ?No facility-administered medications prior to visit.  ? ? ?Allergies  ?Allergen Reactions  ? Banana Anaphylaxis  ? Nystatin Anaphylaxis, Shortness Of Breath and Other (See Comments)  ?  Banana Flavoring  ? Other Anaphylaxis  ?  Chickpeas  ? Augmentin [Amoxicillin-Pot Clavulanate] Hives  ? Codeine   ?  Other reaction(s): Vomiting  ? Hydrocodone Other (See Comments)  ? Penicillins Hives  ? Sudafed [Pseudoephedrine Hcl]   ? Tape Rash  ? ? ?ROS ?Review of Systems  ?Constitutional:  Positive for fatigue. Negative for fever.  ?HENT:  Positive for congestion.   ?Respiratory:  Negative for cough and shortness of breath.   ?Cardiovascular:  Negative for chest pain and leg swelling.  ?Gastrointestinal:  Negative for abdominal pain.  ?Neurological:  Negative for dizziness and headaches.  ? ?  ?Objective:  ?  ?Physical Exam ?Constitutional:   ?   Appearance: Normal appearance. She is not ill-appearing.  ?HENT:  ?   Head: Normocephalic.  ?   Right Ear: Tympanic membrane normal.  ?   Left Ear: Tympanic membrane normal.  ?   Mouth/Throat:  ?   Pharynx: No posterior oropharyngeal erythema.  ?Eyes:  ?   Conjunctiva/sclera: Conjunctivae normal.  ?Cardiovascular:  ?   Rate and Rhythm: Normal rate and regular rhythm.  ?   Pulses: Normal pulses.  ?   Heart sounds: Normal heart sounds.  ?Pulmonary:  ?   Effort: Pulmonary effort is normal.  ?   Breath sounds: Normal breath sounds.  ?Neurological:  ?   Mental Status: She is oriented to person, place, and time.  ?Psychiatric:     ?    Mood and Affect: Mood normal.     ?   Behavior: Behavior normal.  ? ? ?BP 123/83 (BP Location: Right Arm, Patient Position: Sitting, Cuff Size: Normal)   Pulse 84   Ht 5\' 3"  (1.6 m)   Wt 119 lb 6.4 oz (  54.2 kg)   LMP 04/09/2021   SpO2 100%   BMI 21.15 kg/m?  ?Wt Readings from Last 3 Encounters:  ?04/09/21 119 lb 6.4 oz (54.2 kg)  ?02/24/21 116 lb 3.2 oz (52.7 kg)  ?01/07/21 118 lb 6.4 oz (53.7 kg)  ? ? ? ?Health Maintenance Due  ?Topic Date Due  ? HIV Screening  Never done  ? Hepatitis C Screening  Never done  ? ? ?There are no preventive care reminders to display for this patient. ? ?No results found for: TSH ?Garza Results  ?Component Value Date  ? WBC 7.1 04/05/2017  ? HGB 14.3 04/05/2017  ? HCT 43.0 04/05/2017  ? MCV 88 04/05/2017  ? PLT 291 04/05/2017  ? ?Garza Results  ?Component Value Date  ? NA 134 (L) 10/23/2015  ? K 3.3 (L) 10/23/2015  ? CO2 23 10/23/2015  ? GLUCOSE 125 (H) 10/23/2015  ? BUN 11 10/23/2015  ? CREATININE 0.90 10/23/2015  ? CALCIUM 9.4 10/23/2015  ? ANIONGAP 8 10/23/2015  ? ?No results found for: CHOL ?No results found for: HDL ?No results found for: [place] ?No results found for: TRIG ?No results found for: CHOLHDL ?No results found for: HGBA1C ? ?  ?Assessment & Plan:  ? ?Problem List Items Addressed This Visit   ? ?  ? Respiratory  ? Seasonal allergic rhinitis  ?  Switch from claritin to allegra daily.  ?Continue with flonase nasal spray daily and add azelastine ?Advised saline rinses before bed, continue with breathe right strips, humidifier.  ?  ?  ? Relevant Medications  ? azelastine (ASTELIN) 0.1 % nasal spray  ? fexofenadine (ALLEGRA ALLERGY) 180 MG tablet  ?  ? Other  ? Daytime sleepiness - Primary  ?  High epworth sleepiness score ?Cbc/tsh/t4/cmp ordered ?Will hold off on sleep testing until prelim tests come back ?ddx sleep apnea, allergic rhinitis congestion, thyroid condition.  ?See allergy note ?  ?  ? Relevant Orders  ? Comprehensive Metabolic Panel (CMET)  ? CBC  ? TSH +  free T4  ? ?Other Visit Diagnoses   ? ? Snoring      ? Relevant Orders  ? Comprehensive Metabolic Panel (CMET)  ? CBC  ? TSH + free T4  ? ?  ?  ?I, Debra Kirschner, PA-C have reviewed all documentation for this visit.

## 2021-04-10 LAB — COMPREHENSIVE METABOLIC PANEL
ALT: 19 IU/L (ref 0–32)
AST: 23 IU/L (ref 0–40)
Albumin/Globulin Ratio: 1.9 (ref 1.2–2.2)
Albumin: 4.5 g/dL (ref 3.8–4.8)
Alkaline Phosphatase: 85 IU/L (ref 44–121)
BUN/Creatinine Ratio: 19 (ref 9–23)
BUN: 14 mg/dL (ref 6–20)
Bilirubin Total: 0.4 mg/dL (ref 0.0–1.2)
CO2: 22 mmol/L (ref 20–29)
Calcium: 9.4 mg/dL (ref 8.7–10.2)
Chloride: 102 mmol/L (ref 96–106)
Creatinine, Ser: 0.75 mg/dL (ref 0.57–1.00)
Globulin, Total: 2.4 g/dL (ref 1.5–4.5)
Glucose: 77 mg/dL (ref 70–99)
Potassium: 4.3 mmol/L (ref 3.5–5.2)
Sodium: 138 mmol/L (ref 134–144)
Total Protein: 6.9 g/dL (ref 6.0–8.5)
eGFR: 104 mL/min/{1.73_m2} (ref >59)

## 2021-04-10 LAB — CBC
Hematocrit: 43.6 % (ref 34.0–46.6)
Hemoglobin: 14.8 g/dL (ref 11.1–15.9)
MCH: 29.9 pg (ref 26.6–33.0)
MCHC: 33.9 g/dL (ref 31.5–35.7)
MCV: 88 fL (ref 79–97)
Platelets: 284 10*3/uL (ref 150–450)
RBC: 4.95 x10E6/uL (ref 3.77–5.28)
RDW: 11.9 % (ref 11.7–15.4)
WBC: 6.8 10*3/uL (ref 3.4–10.8)

## 2021-04-10 LAB — TSH+FREE T4
Free T4: 1.28 ng/dL (ref 0.82–1.77)
TSH: 1.1 u[IU]/mL (ref 0.450–4.500)

## 2021-07-04 ENCOUNTER — Other Ambulatory Visit: Payer: Self-pay | Admitting: Physician Assistant

## 2021-07-04 DIAGNOSIS — J302 Other seasonal allergic rhinitis: Secondary | ICD-10-CM

## 2021-07-15 ENCOUNTER — Ambulatory Visit: Payer: BC Managed Care – PPO | Admitting: Family Medicine

## 2021-07-15 ENCOUNTER — Encounter: Payer: Self-pay | Admitting: Family Medicine

## 2021-07-15 ENCOUNTER — Ambulatory Visit: Payer: Self-pay | Admitting: *Deleted

## 2021-07-15 VITALS — BP 121/83 | HR 82 | Temp 98.3°F | Resp 16 | Ht 63.0 in | Wt 115.7 lb

## 2021-07-15 DIAGNOSIS — R600 Localized edema: Secondary | ICD-10-CM

## 2021-07-15 MED ORDER — METHYLPREDNISOLONE 4 MG PO TBPK: ORAL_TABLET | ORAL | 0 refills | Status: DC

## 2021-07-15 NOTE — Progress Notes (Signed)
Established patient visit   Patient: Debra Garza   DOB: 03-05-1980   41 y.o. Female  MRN: [id number] Visit Date: 07/15/2021  Today's healthcare provider: Jacky Kindle, FNP   I,Tiffany J Bragg,acting as a scribe for Jacky Kindle, FNP.,have documented all relevant documentation on the behalf of Jacky Kindle, FNP,as directed by  Jacky Kindle, FNP while in the presence of Jacky Kindle, FNP.   Chief Complaint  Patient presents with   Eye Problem   Subjective    HPI HPI   Patient complains bilateral eye swelling starting yesterday at work.  Last edited by Marlana Salvage, CMA on 07/15/2021  2:21 PM.       Medications: Outpatient Medications Prior to Visit  Medication Sig   albuterol (VENTOLIN HFA) 108 (90 Base) MCG/ACT inhaler Inhale 2 puffs into the lungs as needed.   Aspirin-Acetaminophen-Caffeine (GOODY HEADACHE PO) Take by mouth.   Azelastine HCl 137 MCG/SPRAY SOLN PLACE 1 SPRAY INTO BOTH NOSTRILS 2 (TWO) TIMES DAILY. USE IN EACH NOSTRIL AS DIRECTED   CRANBERRY PO Take by mouth.   desvenlafaxine (PRISTIQ) 50 MG 24 hr tablet Take 1 tablet (50 mg total) by mouth every morning.   etonogestrel-ethinyl estradiol (NUVARING) 0.12-0.015 MG/24HR vaginal ring Insert vaginally and leave in place for 3 consecutive weeks, then remove for 1 week.   fexofenadine (ALLEGRA ALLERGY) 180 MG tablet Take 1 tablet (180 mg total) by mouth daily.   fluticasone (FLONASE) 50 MCG/ACT nasal spray Place into both nostrils as needed.   nitrofurantoin (MACRODANTIN) 100 MG capsule Take 1 capsule (100 mg total) by mouth as needed.   Prenatal Vit-Fe Fumarate-FA (PRENATAL VITAMINS) 28-0.8 MG TABS Take by mouth.   Probiotic Product (PROBIOTIC PO) Take by mouth.   rizatriptan (MAXALT) 10 MG tablet Take 1 tablet (10 mg total) by mouth as needed for migraine. May repeat in 2 hours if needed   No facility-administered medications prior to visit.    Review of Systems     Objective    BP  121/83 (BP Location: Right Arm, Patient Position: Sitting, Cuff Size: Normal)   Pulse 82   Temp 98.3 F (36.8 C)   Resp 16   Ht 5\' 3"  (1.6 m)   Wt 115 lb 11.2 oz (52.5 kg)   SpO2 100%   BMI 20.50 kg/m    Physical Exam Vitals and nursing note reviewed.  Constitutional:      General: She is not in acute distress.    Appearance: Normal appearance. She is normal weight. She is not ill-appearing, toxic-appearing or diaphoretic.  HENT:     Head: Normocephalic and atraumatic.     Jaw: There is normal jaw occlusion.     Right Ear: Tympanic membrane, ear canal and external ear normal. There is no impacted cerumen.     Left Ear: Tympanic membrane, ear canal and external ear normal. There is no impacted cerumen.     Nose: Nose normal.     Mouth/Throat:     Mouth: Mucous membranes are moist. No angioedema.     Pharynx: No oropharyngeal exudate or posterior oropharyngeal erythema.     Tonsils: No tonsillar exudate or tonsillar abscesses. 0 on the right. 1+ on the left.  Eyes:     General: Lids are normal. Lids are everted, no foreign bodies appreciated. Vision grossly intact. Gaze aligned appropriately. No allergic shiner, visual field deficit or scleral icterus.       Right eye:  No discharge.        Left eye: No discharge.     Extraocular Movements: Extraocular movements intact.     Conjunctiva/sclera: Conjunctivae normal.      Comments: R>L periorbital edema, pt has been using topical eye to assist with edema   Cardiovascular:     Rate and Rhythm: Normal rate and regular rhythm.     Pulses: Normal pulses.     Heart sounds: Normal heart sounds. No murmur heard.    No friction rub. No gallop.  Pulmonary:     Effort: Pulmonary effort is normal. No respiratory distress.     Breath sounds: Normal breath sounds. No stridor. No wheezing, rhonchi or rales.  Chest:     Chest wall: No tenderness.  Abdominal:     General: Bowel sounds are normal.     Palpations: Abdomen is soft.   Musculoskeletal:        General: No swelling, tenderness, deformity or signs of injury. Normal range of motion.     Cervical back: Normal range of motion and neck supple.     Right lower leg: No edema.     Left lower leg: No edema.  Skin:    General: Skin is warm and dry.     Capillary Refill: Capillary refill takes less than 2 seconds.     Coloration: Skin is not jaundiced or pale.     Findings: No bruising, erythema, lesion or rash.  Neurological:     General: No focal deficit present.     Mental Status: She is alert and oriented to person, place, and time. Mental status is at baseline.     Cranial Nerves: No cranial nerve deficit.     Sensory: No sensory deficit.     Motor: No weakness.     Coordination: Coordination normal.  Psychiatric:        Mood and Affect: Mood normal.        Behavior: Behavior normal.        Thought Content: Thought content normal.        Judgment: Judgment normal.      No results found for any visits on 07/15/21.  Assessment & Plan     Problem List Items Addressed This Visit       Other   Periorbital edema of both eyes - Primary    Acute, stable Denies exudate Was exposed to allergens in work placed; denies respiratory symptoms- was using standard precautions  Has been using frozen eye masks to assist with edema; has successfully reduced edema; however, R remains >L and is visible when patient is looking down  Work note provided for 6/22 due to start of oral steroid taper to assist Has in date EPI pen if needed; patient carries in purse      Relevant Medications   methylPREDNISolone (MEDROL DOSEPAK) 4 MG TBPK tablet (Start on 07/16/2021)     Return if symptoms worsen or fail to improve.      Leilani Merl, FNP, have reviewed all documentation for this visit. The documentation on 07/15/21 for the exam, diagnosis, procedures, and orders are all accurate and complete.    Jacky Kindle, FNP  [facility] [phone]  (phone) [phone] (fax)  [facility]

## 2021-07-15 NOTE — Assessment & Plan Note (Signed)
Acute, stable Denies exudate Was exposed to allergens in work placed; denies respiratory symptoms- was using standard precautions  Has been using frozen eye masks to assist with edema; has successfully reduced edema; however, R remains >L and is visible when patient is looking down  Work note provided for 6/22 due to start of oral steroid taper to assist Has in date EPI pen if needed; patient carries in purse

## 2021-07-15 NOTE — Telephone Encounter (Signed)
  Chief Complaint: eyes swollen Symptoms: periorbital edema Frequency: started yesterday, has a lot of allergies, took all medication Pertinent Negatives: Patient denies fever, drainage Disposition: [] ED /[] Urgent Care (no appt availability in office) / [x] Appointment(In office/virtual)/ []  [facility]/ [] Home Care/ [] Refused Recommended Disposition /[] [facility]/ []  Follow-up with PCP Additional Notes: has a lot of allergies and takes a lot of antihistamine meds but yesterday eyes started swelling, worse this morning. Appt this afternoon.   Reason for Disposition  Eyelids are very swollen (shut or almost)  Answer Assessment - Initial Assessment Questions 1. SEVERITY: "How bad is the itching?"  (e.g., Scale 1-10; mild, moderate or severe)     no 2. ONSET: "When did the eye symptoms start?" (e.g., hours or days ago)     yesterday 3. EYELIDS: "Are the eyelids swollen?" If Yes, ask: "How much?"     Swollen but not shut 4. EYE DISCHARGE: "Is there any discharge from the eye, or eyelid crusting?" If Yes, ask: "How much?"     no 5. TRIGGER: "What do you think triggered the allergic reaction?" (e.g., dust, smoke, pollen, new eye make-up)     Allergic to a lot of things, thinks it is a reaction 6. RECURRENT PROBLEM: "Have you experienced eye allergies before?" If Yes, ask: "When was the last time?" and "What medicine worked best in the past?"     no 7. CONTACTS: "Do you wear contacts?"     Did not have them on 8. OTHER SYMPTOMS: "Do you have any other symptoms?" (e.g., runny nose)     no 9. PREGNANCY: "Is there any chance you are pregnant?" "When was your last menstrual period?"     no  Protocols used: Eye - Allergy-A-AH

## 2021-08-09 ENCOUNTER — Other Ambulatory Visit: Payer: Self-pay | Admitting: Physician Assistant

## 2021-08-09 ENCOUNTER — Other Ambulatory Visit: Payer: Self-pay | Admitting: Psychiatry

## 2021-08-09 DIAGNOSIS — F419 Anxiety disorder, unspecified: Secondary | ICD-10-CM

## 2021-08-09 DIAGNOSIS — J302 Other seasonal allergic rhinitis: Secondary | ICD-10-CM

## 2021-08-09 DIAGNOSIS — F3341 Major depressive disorder, recurrent, in partial remission: Secondary | ICD-10-CM

## 2021-08-18 ENCOUNTER — Other Ambulatory Visit: Payer: Self-pay

## 2021-08-18 ENCOUNTER — Telehealth: Payer: Self-pay | Admitting: Physician Assistant

## 2021-08-18 MED ORDER — ALBUTEROL SULFATE HFA 108 (90 BASE) MCG/ACT IN AERS
2.0000 | INHALATION_SPRAY | RESPIRATORY_TRACT | 5 refills | Status: AC | PRN
Start: 2021-08-18 — End: ?

## 2021-08-18 NOTE — Telephone Encounter (Signed)
[facility] faxed refill request for the following medications:   albuterol (VENTOLIN HFA) 108 (90 Base) MCG/ACT inhaler    Please advise.  

## 2021-08-27 ENCOUNTER — Encounter: Payer: Self-pay | Admitting: Physician Assistant

## 2021-08-31 ENCOUNTER — Other Ambulatory Visit: Payer: Self-pay | Admitting: Physician Assistant

## 2021-08-31 DIAGNOSIS — L509 Urticaria, unspecified: Secondary | ICD-10-CM

## 2021-08-31 DIAGNOSIS — R600 Localized edema: Secondary | ICD-10-CM

## 2021-09-09 ENCOUNTER — Ambulatory Visit: Payer: BC Managed Care – PPO | Admitting: Psychiatry

## 2021-09-14 ENCOUNTER — Encounter: Payer: Self-pay | Admitting: Psychiatry

## 2021-09-14 ENCOUNTER — Ambulatory Visit: Payer: BC Managed Care – PPO | Admitting: Psychiatry

## 2021-09-14 VITALS — Wt 117.0 lb

## 2021-09-14 DIAGNOSIS — F419 Anxiety disorder, unspecified: Secondary | ICD-10-CM | POA: Diagnosis not present

## 2021-09-14 DIAGNOSIS — F902 Attention-deficit hyperactivity disorder, combined type: Secondary | ICD-10-CM

## 2021-09-14 DIAGNOSIS — F3341 Major depressive disorder, recurrent, in partial remission: Secondary | ICD-10-CM | POA: Diagnosis not present

## 2021-09-14 MED ORDER — DESVENLAFAXINE SUCCINATE ER 50 MG PO TB24: 50.0000 mg | ORAL_TABLET | Freq: Every morning | ORAL | 1 refills | Status: DC

## 2021-09-14 NOTE — Progress Notes (Signed)
Tamme Mozingo [id number] Sep 03, 1980 41 y.o.  Subjective:   Patient ID:  Debra Garza is a 41 y.o. (DOB [birth year]) female.  Chief Complaint:  Chief Complaint  Patient presents with   Follow-up    Anxiety, depression, and ADHD    HPI Debra Garza presents to the office today for follow-up of anxiety, depression, and ADHD. Mood has been "good." She attributes this to a more supportive work culture. Denies anxiety, even in stressful situations. Sleeping well. Has had nightmares when taking oral steroids.   She reports that she "could use a little more focus." Plans to use a supplement to try to help with focus. She prefers to avoid stimulants. Appetite has been higher with not taking a stimulant. Reports that she hyper-focuses on things at times. She reports that she has been exercising regularly. Energy and motivation have been ok. Denies SI.  Continues to see individual and couple's therapists.   Past medication trials: Vyvanse-effective Dexedrine-effective Adderall- mood swings, tearfulness and irritability Methylphenidate-effective for only brief periods of time Concerta-increased blood pressure Strattera-increased blood pressure. Ineffective.  Ophelia Charter PM- Minimally effective Clonidine- Severe drowsiness Mirtazapine-low energy Wellbutrin XL-helpful for mood at 150 mg daily.  Had increased anxiety at 300 mg dose. Trintellix-Has been helpful for mood and anxiety.  Prozac Pristiq  PHQ2-9    Flowsheet Row Office Visit from 07/15/2021 in [facility] Office Visit from 04/09/2021 in [facility] Office Visit from 12/02/2017 in [facility]  PHQ-2 Total Score 1 2 0  PHQ-9 Total Score 4 12 --        Review of Systems:  Review of Systems  HENT:  Positive for dental problem.   Musculoskeletal:  Negative for gait problem.  Allergic/Immunologic: Positive for environmental allergies.  Neurological:  Negative  for tremors.  Psychiatric/Behavioral:         Please refer to HPI    Medications: I have reviewed the patient's current medications.  Current Outpatient Medications  Medication Sig Dispense Refill   albuterol (VENTOLIN HFA) 108 (90 Base) MCG/ACT inhaler Inhale 2 puffs into the lungs as needed. 1 each 5   Aspirin-Acetaminophen-Caffeine (GOODY HEADACHE PO) Take by mouth.     Azelastine HCl 137 MCG/SPRAY SOLN PLACE 1 SPRAY INTO BOTH NOSTRILS 2 (TWO) TIMES DAILY. USE IN EACH NOSTRIL AS DIRECTED 30 mL 1   COLLAGEN-VITAMIN C-BIOTIN PO Take by mouth.     CRANBERRY PO Take by mouth.     desvenlafaxine (PRISTIQ) 50 MG 24 hr tablet Take 1 tablet (50 mg total) by mouth every morning. 90 tablet 1   etonogestrel-ethinyl estradiol (NUVARING) 0.12-0.015 MG/24HR vaginal ring Insert vaginally and leave in place for 3 consecutive weeks, then remove for 1 week. 3 each 3   fexofenadine (ALLEGRA) 180 MG tablet TAKE 1 TABLET BY MOUTH EVERY DAY 90 tablet 0   fluticasone (FLONASE) 50 MCG/ACT nasal spray Place into both nostrils as needed.     nitrofurantoin (MACRODANTIN) 100 MG capsule Take 1 capsule (100 mg total) by mouth as needed. 30 capsule 6   Prenatal Vit-Fe Fumarate-FA (PRENATAL VITAMINS) 28-0.8 MG TABS Take by mouth.     Probiotic Product (PROBIOTIC PO) Take by mouth.     rizatriptan (MAXALT) 10 MG tablet Take 1 tablet (10 mg total) by mouth as needed for migraine. May repeat in 2 hours if needed 10 tablet 0   methylPREDNISolone (MEDROL DOSEPAK) 4 MG TBPK tablet Take with food (Patient not taking: Reported on 09/14/2021) 1 each 0  No current facility-administered medications for this visit.    Medication Side Effects: None  Allergies:  Allergies  Allergen Reactions   Banana Anaphylaxis   Nystatin Anaphylaxis, Shortness Of Breath and Other (See Comments)    Banana Flavoring   Other Anaphylaxis    Chickpeas   Augmentin [Amoxicillin-Pot Clavulanate] Hives   Codeine     Other reaction(s):  Vomiting   Hydrocodone Other (See Comments)   Penicillins Hives   Sudafed [Pseudoephedrine Hcl]    Tape Rash    Past Medical History:  Diagnosis Date   ADD (attention deficit disorder)    Allergic rhinitis    Anxiety    Asthma    Depression    DR. KAUR   Insomnia    Migraine    Other abnormal glucose    SEASONAL ALLERGIES   Panic attacks    Strabismic amblyopia of right eye     Past Medical History, Surgical history, Social history, and Family history were reviewed and updated as appropriate.   Please see review of systems for further details on the patient's review from today.   Objective:   Physical Exam:  Wt 117 lb (53.1 kg)   BMI 20.73 kg/m   Physical Exam Constitutional:      General: She is not in acute distress. Musculoskeletal:        General: No deformity.  Neurological:     Mental Status: She is alert and oriented to person, place, and time.     Coordination: Coordination normal.  Psychiatric:        Attention and Perception: Attention and perception normal. She does not perceive auditory or visual hallucinations.        Mood and Affect: Mood normal. Mood is not anxious or depressed. Affect is not labile, blunt, angry or inappropriate.        Speech: Speech normal.        Behavior: Behavior normal.        Thought Content: Thought content normal. Thought content is not paranoid or delusional. Thought content does not include homicidal or suicidal ideation. Thought content does not include homicidal or suicidal plan.        Cognition and Memory: Cognition and memory normal.        Judgment: Judgment normal.     Comments: Insight intact     Lab Review:     Component Value Date/Time   NA 138 04/09/2021 1356   K 4.3 04/09/2021 1356   CL 102 04/09/2021 1356   CO2 22 04/09/2021 1356   GLUCOSE 77 04/09/2021 1356   GLUCOSE 125 (H) 10/23/2015 0146   BUN 14 04/09/2021 1356   CREATININE 0.75 04/09/2021 1356   CALCIUM 9.4 04/09/2021 1356   PROT 6.9  04/09/2021 1356   ALBUMIN 4.5 04/09/2021 1356   AST 23 04/09/2021 1356   ALT 19 04/09/2021 1356   ALKPHOS 85 04/09/2021 1356   BILITOT 0.4 04/09/2021 1356   GFRNONAA >60 10/23/2015 0146   GFRAA >60 10/23/2015 0146       Component Value Date/Time   WBC 6.8 04/09/2021 1356   WBC 9.2 10/23/2015 0146   RBC 4.95 04/09/2021 1356   RBC 5.42 (H) 10/23/2015 0146   HGB 14.8 04/09/2021 1356   HCT 43.6 04/09/2021 1356   PLT 284 04/09/2021 1356   MCV 88 04/09/2021 1356   MCH 29.9 04/09/2021 1356   MCH 29.6 10/23/2015 0146   MCHC 33.9 04/09/2021 1356   MCHC 34.9 10/23/2015 0146   RDW  11.9 04/09/2021 1356   LYMPHSABS 2.9 04/05/2017 1206   EOSABS 0.3 04/05/2017 1206   BASOSABS 0.0 04/05/2017 1206    No results found for: "POCLITH", "LITHIUM"   No results found for: "PHENYTOIN", "PHENOBARB", "VALPROATE", "CBMZ"   .res Assessment: Plan:   Pt seen for 30 minutes and time spent discussing alternatives to stimulants to include supplements, organization skills, and coping strategies.  Continue Pristiq 50 mg po qd for depression and anxiety.  Pt to follow-up in 6 months or sooner if clinically indicated.  Patient advised to contact office with any questions, adverse effects, or acute worsening in signs and symptoms.  Debra was seen today for follow-up.  Diagnoses and all orders for this visit:  Recurrent major depressive disorder, in partial remission (HCC)  Anxiety  Attention deficit hyperactivity disorder (ADHD), combined type     Please see After Visit Summary for patient specific instructions.  No future appointments.  No orders of the defined types were placed in this encounter.   -------------------------------

## 2021-09-30 ENCOUNTER — Telehealth: Payer: Self-pay

## 2021-09-30 NOTE — Telephone Encounter (Signed)
Copied from CRM [id number]. Topic: General - Other >> Sep 30, 2021 11:03 AM Franchot Heidelberg wrote: Reason for CRM: Lillia Abed calling from [facility] Oral Surgery Dr. Adaline Sill office called for update on fax that was sent on 09/15/2021.  Best contact: [phone]  Her procedure date is September 22nd, she needs this clearance form sent back at least a week prior to make sure she is all set.

## 2021-10-05 ENCOUNTER — Encounter: Payer: Self-pay | Admitting: Physician Assistant

## 2021-10-05 ENCOUNTER — Ambulatory Visit: Payer: BC Managed Care – PPO | Admitting: Physician Assistant

## 2021-10-05 VITALS — BP 145/83 | HR 91 | Ht 63.0 in | Wt 118.9 lb

## 2021-10-05 DIAGNOSIS — Z1159 Encounter for screening for other viral diseases: Secondary | ICD-10-CM

## 2021-10-05 DIAGNOSIS — Z0000 Encounter for general adult medical examination without abnormal findings: Secondary | ICD-10-CM

## 2021-10-05 DIAGNOSIS — R030 Elevated blood-pressure reading, without diagnosis of hypertension: Secondary | ICD-10-CM | POA: Diagnosis not present

## 2021-10-05 DIAGNOSIS — Z114 Encounter for screening for human immunodeficiency virus [HIV]: Secondary | ICD-10-CM | POA: Diagnosis not present

## 2021-10-05 NOTE — Assessment & Plan Note (Addendum)
Pt admits to anxiety today regarding health issues w/ dog

## 2021-10-05 NOTE — Progress Notes (Signed)
I,Sha'taria Tyson,acting as a Education administrator for [employer], PA-C.,have documented all relevant documentation on the behalf of Mikey Kirschner, PA-C,as directed by  Mikey Kirschner, PA-C while in the presence of Mikey Kirschner, PA-C.   Complete physical exam   Patient: Debra Garza   DOB: 1980-11-13   41 y.o. Female  MRN: [id number] Visit Date: 10/05/2021  Today's healthcare provider: Mikey Kirschner, PA-C   Cc. Cpe   Subjective    Debra Garza is a 41 y.o. female who presents today for a complete physical exam.  She reports consuming a general and low sugar and zero sugar and some low sodium  diet.  The patient reports doing a combination of cardio and weight bearing exercises as well as yoga. Patient participates at least 3 times weekly for one hour  She generally feels well. She reports sleeping well. She does not have additional problems to discuss today.  HPI  -Patient declined flu vaccine. Will receive through work. -Would like to receive HIV and Hep C Screening, h/o exposure at work and though cleared and negative would like to recheck.  Past Medical History:  Diagnosis Date   ADD (attention deficit disorder)    Allergic rhinitis    Anxiety    Asthma    Depression    DR. KAUR   Insomnia    Migraine    Other abnormal glucose    SEASONAL ALLERGIES   Panic attacks    Strabismic amblyopia of right eye    Past Surgical History:  Procedure Laterality Date   STRABISMUS SURGERY Bilateral 01/04/2018   Social History   Socioeconomic History   Marital status: Married    Spouse name: Marylyn Ishihara   Number of children: 0   Years of education: 16   Highest education level: Not on file  Occupational History   Occupation: NURSE  Tobacco Use   Smoking status: Never   Smokeless tobacco: Never  Vaping Use   Vaping Use: Never used  Substance and Sexual Activity   Alcohol use: Yes    Alcohol/week: 1.0 - 2.0 standard drink of alcohol    Types: 1 - 2 Cans of beer  per week    Comment: occasionally   Drug use: No   Sexual activity: Yes    Partners: Male    Birth control/protection: Inserts    Comment: Nuvaring  Other Topics Concern   Not on file  Social History Narrative   Not on file   Social Determinants of Health   Financial Resource Strain: Not on file  Food Insecurity: Not on file  Transportation Needs: Not on file  Physical Activity: Not on file  Stress: Not on file  Social Connections: Not on file  Intimate Partner Violence: Not on file   Family Status  Relation Name Status   Mother  Alive   Father  Alive   Sister  Alive   MGM  Deceased   MGF  Deceased   Family History  Problem Relation Age of Onset   Healthy Mother    Cancer Mother        BASEL CELL 59   Breast cancer Mother 3       DCIS   Healthy Father    Hyperlipidemia Father    Cancer Father        BASEL CELL 18   ADD / ADHD Father    Healthy Sister    Depression Sister    Liver cancer Maternal Grandmother    Cancer Maternal  Grandmother        COLON   Schizophrenia Maternal Grandmother    Dementia Maternal Grandfather    Allergies  Allergen Reactions   Banana Anaphylaxis   Nystatin Anaphylaxis, Shortness Of Breath and Other (See Comments)    Banana Flavoring   Other Anaphylaxis    Chickpeas   Penicillins Hives   Augmentin [Amoxicillin-Pot Clavulanate] Hives   Pseudoephedrine Hcl    Sudafed [Pseudoephedrine Hcl]    Codeine Nausea And Vomiting    Other reaction(s): Vomiting   Hydrocodone Other (See Comments) and Nausea And Vomiting    Other reaction(s): Other (See Comments)   Tape Rash    Patient Care Team: Emelia Loron as PCP - General (Physician Assistant)   Medications: Outpatient Medications Prior to Visit  Medication Sig   albuterol (VENTOLIN HFA) 108 (90 Base) MCG/ACT inhaler Inhale 2 puffs into the lungs as needed.   Aspirin-Acetaminophen-Caffeine (GOODY HEADACHE PO) Take by mouth.   Azelastine HCl 137 MCG/SPRAY SOLN PLACE 1  SPRAY INTO BOTH NOSTRILS 2 (TWO) TIMES DAILY. USE IN EACH NOSTRIL AS DIRECTED   COLLAGEN-VITAMIN C-BIOTIN PO Take by mouth.   CRANBERRY PO Take by mouth.   desvenlafaxine (PRISTIQ) 50 MG 24 hr tablet Take 1 tablet (50 mg total) by mouth every morning.   etonogestrel-ethinyl estradiol (NUVARING) 0.12-0.015 MG/24HR vaginal ring Insert vaginally and leave in place for 3 consecutive weeks, then remove for 1 week.   fexofenadine (ALLEGRA) 180 MG tablet TAKE 1 TABLET BY MOUTH EVERY DAY   fluticasone (FLONASE) 50 MCG/ACT nasal spray Place into both nostrils as needed.   nitrofurantoin (MACRODANTIN) 100 MG capsule Take 1 capsule (100 mg total) by mouth as needed.   Prenatal Vit-Fe Fumarate-FA (PRENATAL VITAMINS) 28-0.8 MG TABS Take by mouth.   Probiotic Product (PROBIOTIC PO) Take by mouth.   rizatriptan (MAXALT) 10 MG tablet Take 1 tablet (10 mg total) by mouth as needed for migraine. May repeat in 2 hours if needed   [DISCONTINUED] methylPREDNISolone (MEDROL DOSEPAK) 4 MG TBPK tablet Take with food   No facility-administered medications prior to visit.    Review of Systems  Constitutional:  Negative for fatigue and fever.  Respiratory:  Negative for cough and shortness of breath.   Cardiovascular:  Negative for chest pain and leg swelling.  Gastrointestinal:  Negative for abdominal pain.  Neurological:  Negative for dizziness and headaches.      Objective     BP (!) 145/83 (BP Location: Right Arm, Patient Position: Sitting, Cuff Size: Normal)   Pulse 91   Ht _0  (1.6 m)   Wt 118 lb 14.4 oz (53.9 kg)   LMP 08/27/2021 (Within Weeks)   SpO2 99%   BMI 21.06 kg/m     Physical Exam Constitutional:      General: She is awake.     Appearance: She is well-developed. She is not ill-appearing.  HENT:     Head: Normocephalic.     Right Ear: Tympanic membrane normal.     Left Ear: Tympanic membrane normal.     Nose: Nose normal. No congestion or rhinorrhea.     Mouth/Throat:      Pharynx: No oropharyngeal exudate or posterior oropharyngeal erythema.  Eyes:     Conjunctiva/sclera: Conjunctivae normal.     Pupils: Pupils are equal, round, and reactive to light.  Neck:     Thyroid: No thyroid mass or thyromegaly.  Cardiovascular:     Rate and Rhythm: Normal rate and regular rhythm.  Heart sounds: Normal heart sounds.  Pulmonary:     Effort: Pulmonary effort is normal.     Breath sounds: Normal breath sounds.  Abdominal:     Palpations: Abdomen is soft.     Tenderness: There is no abdominal tenderness.  Musculoskeletal:     Right lower leg: No swelling. No edema.     Left lower leg: No swelling. No edema.  Lymphadenopathy:     Cervical: No cervical adenopathy.  Skin:    General: Skin is warm.  Neurological:     Mental Status: She is alert and oriented to person, place, and time.  Psychiatric:        Attention and Perception: Attention normal.        Mood and Affect: Mood normal.        Speech: Speech normal.        Behavior: Behavior normal. Behavior is cooperative.     Last depression screening scores    10/05/2021    2:15 PM 07/15/2021    2:22 PM 04/09/2021   11:13 AM  PHQ 2/9 Scores  PHQ - 2 Score _0 PHQ- 9 Score _1 Last fall risk screening    10/05/2021    2:14 PM  [place] in the past year? 0  Number falls in past yr: 0  Injury with Fall? 0  Risk for fall due to : No Fall Risks  Follow up Falls evaluation completed   Last Audit-C alcohol use screening    10/05/2021    2:14 PM  Alcohol Use Disorder Test (AUDIT)  1. How often do you have a drink containing alcohol? 2  2. How many drinks containing alcohol do you have on a typical day when you are drinking? 0  3. How often do you have six or more drinks on one occasion? 0  AUDIT-C Score 2   A score of 3 or more in women, and 4 or more in men indicates increased risk for alcohol abuse, EXCEPT if all of the points are from question 1   No results found for any  visits on 10/05/21.  Assessment & Plan    Routine Health Maintenance and Physical Exam  Exercise Activities and Dietary recommendations --balanced diet high in fiber and protein, low in sugars, carbs, fats. --physical activity/exercise 30 minutes 3-5 times a week     Immunization History  Administered Date(s) Administered   Hepatitis B, PED/ADOLESCENT 05/09/1994, 11/01/1994, 11/29/1994   Influenza,inj,Quad PF,6+ Mos 11/27/2020   Influenza,inj,quad, With Preservative 01/16/2020   Influenza-Unspecified 10/12/2012, 10/11/2013, 10/09/2014, 10/05/2016, 10/14/2017, 11/18/2018   MMR 04/01/1984, 06/24/1999   Moderna Sars-Covid-2 Vaccination 03/18/2020   PFIZER(Purple Top)SARS-COV-2 Vaccination 02/14/2019, 03/05/2019   PPD Test 03/15/2013, 03/01/2014, 01/11/2019   Td 10/05/2016   Tdap 06/09/1988, 06/24/1999, 11/07/2006    Health Maintenance  Topic Date Due   Hepatitis C Screening  Never done   COVID-19 Vaccine (4 - Pfizer series) 05/13/2020   INFLUENZA VACCINE  04/25/2022 (Originally 08/25/2021)   PAP SMEAR-Modifier  12/16/2023   TETANUS/TDAP  10/06/2026   HIV Screening  Completed   HPV VACCINES  Aged Out    Discussed health benefits of physical activity, and encouraged her to engage in regular exercise appropriate for her age and condition.  Problem List Items Addressed This Visit       Other   Elevated blood pressure reading in office without diagnosis of hypertension    Pt admits to anxiety today regarding  health issues w/ dog      Other Visit Diagnoses     Encounter for annual physical exam    -  Primary   Relevant Orders   Lipid panel   CBC w/Diff/Platelet   Comprehensive Metabolic Panel (CMET)   Encounter for screening for HIV       Relevant Orders   HIV antibody (with reflex)   Encounter for hepatitis C screening test for low risk patient       Relevant Orders   Hepatitis C antibody        Return in about 1 year (around 10/06/2022) for CPE.     I, Mikey Kirschner, PA-C have reviewed all documentation for this visit. The documentation on  10/05/2021 for the exam, diagnosis, procedures, and orders are all accurate and complete.  Mikey Kirschner, PA-C [facility] [address] [place], [place], [address] Office: [phone] Fax: [place]

## 2021-10-07 LAB — CBC WITH DIFFERENTIAL/PLATELET
Basophils Absolute: 0 10*3/uL (ref 0.0–0.2)
Basos: 1 %
EOS (ABSOLUTE): 0.1 10*3/uL (ref 0.0–0.4)
Eos: 2 %
Hematocrit: 41.6 % (ref 34.0–46.6)
Hemoglobin: 14.4 g/dL (ref 11.1–15.9)
Immature Grans (Abs): 0 10*3/uL (ref 0.0–0.1)
Immature Granulocytes: 0 %
Lymphocytes Absolute: 1.9 10*3/uL (ref 0.7–3.1)
Lymphs: 36 %
MCH: 30.8 pg (ref 26.6–33.0)
MCHC: 34.6 g/dL (ref 31.5–35.7)
MCV: 89 fL (ref 79–97)
Monocytes Absolute: 0.5 10*3/uL (ref 0.1–0.9)
Monocytes: 9 %
Neutrophils Absolute: 2.8 10*3/uL (ref 1.4–7.0)
Neutrophils: 52 %
Platelets: 258 10*3/uL (ref 150–450)
RBC: 4.68 x10E6/uL (ref 3.77–5.28)
RDW: 12.3 % (ref 11.7–15.4)
WBC: 5.3 10*3/uL (ref 3.4–10.8)

## 2021-10-07 LAB — COMPREHENSIVE METABOLIC PANEL
ALT: 14 IU/L (ref 0–32)
AST: 16 IU/L (ref 0–40)
Albumin/Globulin Ratio: 1.6 (ref 1.2–2.2)
Albumin: 4.1 g/dL (ref 3.9–4.9)
Alkaline Phosphatase: 63 IU/L (ref 44–121)
BUN/Creatinine Ratio: 17 (ref 9–23)
BUN: 12 mg/dL (ref 6–20)
Bilirubin Total: 0.4 mg/dL (ref 0.0–1.2)
CO2: 21 mmol/L (ref 20–29)
Calcium: 8.9 mg/dL (ref 8.7–10.2)
Chloride: 104 mmol/L (ref 96–106)
Creatinine, Ser: 0.72 mg/dL (ref 0.57–1.00)
Globulin, Total: 2.6 g/dL (ref 1.5–4.5)
Glucose: 88 mg/dL (ref 70–99)
Potassium: 4.6 mmol/L (ref 3.5–5.2)
Sodium: 139 mmol/L (ref 134–144)
Total Protein: 6.7 g/dL (ref 6.0–8.5)
eGFR: 110 mL/min/{1.73_m2} (ref >59)

## 2021-10-07 LAB — LIPID PANEL
Chol/HDL Ratio: 2.4 ratio (ref 0.0–4.4)
Cholesterol, Total: 155 mg/dL (ref 100–199)
HDL: 64 mg/dL (ref >39)
LDL Chol Calc (NIH): 80 mg/dL (ref 0–99)
Triglycerides: 49 mg/dL (ref 0–149)
VLDL Cholesterol Cal: 11 mg/dL (ref 5–40)

## 2021-10-07 LAB — HIV ANTIBODY (ROUTINE TESTING W REFLEX): HIV Screen 4th Generation wRfx: NONREACTIVE

## 2021-10-07 LAB — HEPATITIS C ANTIBODY: Hep C Virus Ab: NONREACTIVE

## 2021-10-13 NOTE — Telephone Encounter (Signed)
Ria Comment called back to request the presurgical form and medical update H&P be faxed back ASAP.  FAX#  [phone]

## 2021-11-05 ENCOUNTER — Other Ambulatory Visit: Payer: Self-pay | Admitting: Physician Assistant

## 2021-11-05 DIAGNOSIS — J302 Other seasonal allergic rhinitis: Secondary | ICD-10-CM

## 2021-12-15 ENCOUNTER — Other Ambulatory Visit: Payer: Self-pay

## 2021-12-15 DIAGNOSIS — Z3044 Encounter for surveillance of vaginal ring hormonal contraceptive device: Secondary | ICD-10-CM

## 2022-01-08 ENCOUNTER — Other Ambulatory Visit: Payer: Self-pay | Admitting: Obstetrics

## 2022-01-08 DIAGNOSIS — Z3044 Encounter for surveillance of vaginal ring hormonal contraceptive device: Secondary | ICD-10-CM

## 2022-01-12 ENCOUNTER — Other Ambulatory Visit: Payer: Self-pay

## 2022-01-12 DIAGNOSIS — Z3044 Encounter for surveillance of vaginal ring hormonal contraceptive device: Secondary | ICD-10-CM

## 2022-01-29 ENCOUNTER — Other Ambulatory Visit: Payer: Self-pay | Admitting: Obstetrics

## 2022-01-29 ENCOUNTER — Other Ambulatory Visit: Payer: Self-pay | Admitting: Physician Assistant

## 2022-01-29 DIAGNOSIS — J302 Other seasonal allergic rhinitis: Secondary | ICD-10-CM

## 2022-01-29 DIAGNOSIS — Z3044 Encounter for surveillance of vaginal ring hormonal contraceptive device: Secondary | ICD-10-CM

## 2022-02-23 ENCOUNTER — Other Ambulatory Visit: Payer: Self-pay | Admitting: Psychiatry

## 2022-02-23 DIAGNOSIS — F419 Anxiety disorder, unspecified: Secondary | ICD-10-CM

## 2022-02-23 DIAGNOSIS — F3341 Major depressive disorder, recurrent, in partial remission: Secondary | ICD-10-CM

## 2022-03-15 ENCOUNTER — Telehealth (INDEPENDENT_AMBULATORY_CARE_PROVIDER_SITE_OTHER): Payer: BC Managed Care – PPO | Admitting: Psychiatry

## 2022-03-15 ENCOUNTER — Encounter: Payer: Self-pay | Admitting: Psychiatry

## 2022-03-15 DIAGNOSIS — F902 Attention-deficit hyperactivity disorder, combined type: Secondary | ICD-10-CM | POA: Diagnosis not present

## 2022-03-15 DIAGNOSIS — F419 Anxiety disorder, unspecified: Secondary | ICD-10-CM

## 2022-03-15 DIAGNOSIS — F331 Major depressive disorder, recurrent, moderate: Secondary | ICD-10-CM | POA: Diagnosis not present

## 2022-03-15 MED ORDER — DESVENLAFAXINE SUCCINATE ER 100 MG PO TB24: 100.0000 mg | ORAL_TABLET | Freq: Every morning | ORAL | 0 refills | Status: DC

## 2022-03-15 NOTE — Progress Notes (Signed)
Debra Garza [id number] 1980-01-26 42 y.o.  Virtual Visit via Video Note  I connected with pt @ on 03/15/22 at  1:45 PM EST by a video enabled telemedicine application and verified that I am speaking with the correct person using two identifiers.   I discussed the limitations of evaluation and management by telemedicine and the availability of in person appointments. The patient expressed understanding and agreed to proceed.  I discussed the assessment and treatment plan with the patient. The patient was provided an opportunity to ask questions and all were answered. The patient agreed with the plan and demonstrated an understanding of the instructions.   The patient was advised to call back or seek an in-person evaluation if the symptoms worsen or if the condition fails to improve as anticipated.  I provided 29 minutes of non-face-to-face time during this encounter.  The patient was located at home.  The provider was located at home.   Thayer Headings, PMHNP   Subjective:   Patient ID:  Debra Garza is a 42 y.o. (DOB October 26, 1980) female.  Chief Complaint:  Chief Complaint  Patient presents with   Depression    HPI Debra Garza presents for follow-up of anxiety, depression, and ADHD. She reports that her mood has been lower and has been experiencing persistent sad mood. Denies irritability. She reports that she is "tired all the time." Motivation has been low. She reports difficulty getting going and being productive. Reports anxiety is "there, but not worse." Adequate sleep. Estimates sleeping about 6 hours. No change in appetite. Concentration- "definitely could be better." She notices some forgetfulness. She and her husband have been walking and enjoys this. Denies SI.   Continues to see couple's therapist and individual therapists. She reports occ conflict with husband in response to her falling behind with house work.   Past medication  trials: Vyvanse-effective Dexedrine-effective Adderall- mood swings, tearfulness and irritability Methylphenidate-effective for only brief periods of time Concerta-increased blood pressure Strattera-increased blood pressure. Ineffective.  Debra Garza PM- Minimally effective Clonidine- Severe drowsiness Mirtazapine-low energy Wellbutrin XL-helpful for mood at 150 mg daily.  Had increased anxiety at 300 mg dose. Trintellix-Has been helpful for mood and anxiety.  Prozac Pristiq  Review of Systems:  Review of Systems  Cardiovascular:        Occ palpitations  Musculoskeletal:  Negative for gait problem.  Neurological:  Negative for tremors.       Denies any recent migraines  Psychiatric/Behavioral:         Please refer to HPI    Medications: I have reviewed the patient's current medications.  Current Outpatient Medications  Medication Sig Dispense Refill   Aspirin-Acetaminophen-Caffeine (GOODY HEADACHE PO) Take by mouth.     Azelastine HCl 137 MCG/SPRAY SOLN PLACE 1 SPRAY INTO BOTH NOSTRILS 2 (TWO) TIMES DAILY. USE IN EACH NOSTRIL AS DIRECTED 30 mL 1   COLLAGEN-VITAMIN C-BIOTIN PO Take by mouth.     CRANBERRY PO Take by mouth.     fexofenadine (ALLEGRA) 180 MG tablet TAKE 1 TABLET BY MOUTH EVERY DAY 90 tablet 3   fluticasone (FLONASE) 50 MCG/ACT nasal spray Place into both nostrils as needed.     Prenatal Vit-Fe Fumarate-FA (PRENATAL VITAMINS) 28-0.8 MG TABS Take by mouth.     Probiotic Product (PROBIOTIC PO) Take by mouth.     UNABLE TO FIND Med Name: Mushroom complex     albuterol (VENTOLIN HFA) 108 (90 Base) MCG/ACT inhaler Inhale 2 puffs into the lungs as needed. 1 each  5   desvenlafaxine (PRISTIQ) 100 MG 24 hr tablet Take 1 tablet (100 mg total) by mouth every morning. 90 tablet 0   etonogestrel-ethinyl estradiol (NUVARING) 0.12-0.015 MG/24HR vaginal ring Insert vaginally and leave in place for 3 consecutive weeks, then remove for 1 week. (Patient not taking: Reported on  03/15/2022) 3 each 3   nitrofurantoin (MACRODANTIN) 100 MG capsule Take 1 capsule (100 mg total) by mouth as needed. 30 capsule 6   rizatriptan (MAXALT) 10 MG tablet Take 1 tablet (10 mg total) by mouth as needed for migraine. May repeat in 2 hours if needed 10 tablet 0   No current facility-administered medications for this visit.    Medication Side Effects: None  Allergies:  Allergies  Allergen Reactions   Banana Anaphylaxis   Nystatin Anaphylaxis, Shortness Of Breath and Other (See Comments)    Banana Flavoring   Other Anaphylaxis    Chickpeas   Penicillins Hives   Augmentin [Amoxicillin-Pot Clavulanate] Hives   Pseudoephedrine Hcl    Sudafed [Pseudoephedrine Hcl]    Codeine Nausea And Vomiting    Other reaction(s): Vomiting   Hydrocodone Other (See Comments) and Nausea And Vomiting    Other reaction(s): Other (See Comments)   Tape Rash    Past Medical History:  Diagnosis Date   ADD (attention deficit disorder)    Allergic rhinitis    Anxiety    Asthma    Depression    DR. KAUR   Insomnia    Migraine    Other abnormal glucose    SEASONAL ALLERGIES   Panic attacks    Strabismic amblyopia of right eye     Family History  Problem Relation Age of Onset   Healthy Mother    Cancer Mother        BASEL CELL 14   Breast cancer Mother 68       DCIS   Healthy Father    Hyperlipidemia Father    Cancer Father        BASEL CELL 8   ADD / ADHD Father    Healthy Sister    Depression Sister    Liver cancer Maternal Grandmother    Cancer Maternal Grandmother        COLON   Schizophrenia Maternal Grandmother    Dementia Maternal Grandfather     Social History   Socioeconomic History   Marital status: Married    Spouse name: Marylyn Ishihara   Number of children: 0   Years of education: 16   Highest education level: Not on file  Occupational History   Occupation: NURSE  Tobacco Use   Smoking status: Never   Smokeless tobacco: Never  Vaping Use   Vaping Use: Never  used  Substance and Sexual Activity   Alcohol use: Yes    Alcohol/week: 1.0 - 2.0 standard drink of alcohol    Types: 1 - 2 Cans of beer per week    Comment: occasionally   Drug use: No   Sexual activity: Yes    Partners: Male    Birth control/protection: Inserts    Comment: Nuvaring  Other Topics Concern   Not on file  Social History Narrative   Not on file   Social Determinants of Health   Financial Resource Strain: Not on file  Food Insecurity: Not on file  Transportation Needs: Not on file  Physical Activity: Not on file  Stress: Not on file  Social Connections: Not on file  Intimate Partner Violence: Not on file  Past Medical History, Surgical history, Social history, and Family history were reviewed and updated as appropriate.   Please see review of systems for further details on the patient's review from today.   Objective:   Physical Exam:  There were no vitals taken for this visit.  Physical Exam Neurological:     Mental Status: She is alert and oriented to person, place, and time.     Cranial Nerves: No dysarthria.  Psychiatric:        Attention and Perception: Attention and perception normal.        Mood and Affect: Mood is depressed.        Speech: Speech normal.        Behavior: Behavior is cooperative.        Thought Content: Thought content normal. Thought content is not paranoid or delusional. Thought content does not include homicidal or suicidal ideation. Thought content does not include homicidal or suicidal plan.        Cognition and Memory: Cognition and memory normal.        Judgment: Judgment normal.     Comments: Insight intact Mood is mildly anxious     Lab Review:     Component Value Date/Time   NA 139 10/06/2021 0915   K 4.6 10/06/2021 0915   CL 104 10/06/2021 0915   CO2 21 10/06/2021 0915   GLUCOSE 88 10/06/2021 0915   GLUCOSE 125 (H) 10/23/2015 0146   BUN 12 10/06/2021 0915   CREATININE 0.72 10/06/2021 0915   CALCIUM 8.9  10/06/2021 0915   PROT 6.7 10/06/2021 0915   ALBUMIN 4.1 10/06/2021 0915   AST 16 10/06/2021 0915   ALT 14 10/06/2021 0915   ALKPHOS 63 10/06/2021 0915   BILITOT 0.4 10/06/2021 0915   GFRNONAA >60 10/23/2015 0146   GFRAA >60 10/23/2015 0146       Component Value Date/Time   WBC 5.3 10/06/2021 0915   WBC 9.2 10/23/2015 0146   RBC 4.68 10/06/2021 0915   RBC 5.42 (H) 10/23/2015 0146   HGB 14.4 10/06/2021 0915   HCT 41.6 10/06/2021 0915   PLT 258 10/06/2021 0915   MCV 89 10/06/2021 0915   MCH 30.8 10/06/2021 0915   MCH 29.6 10/23/2015 0146   MCHC 34.6 10/06/2021 0915   MCHC 34.9 10/23/2015 0146   RDW 12.3 10/06/2021 0915   LYMPHSABS 1.9 10/06/2021 0915   EOSABS 0.1 10/06/2021 0915   BASOSABS 0.0 10/06/2021 0915    No results found for: "POCLITH", "LITHIUM"   No results found for: "PHENYTOIN", "PHENOBARB", "VALPROATE", "CBMZ"   .res Assessment: Plan:   Pt seen for 29 minutes and time spent discussing potential benefits, risks, and side effects of treatment options for depressive s/s to include increasing Pristiq, Viibryd and Auvelity. Will increase Pristiq to 100 mg po qd to improve depression and mild anxiety since Pristiq has been helpful for target symptoms in the past and been well tolerated.  Discussed consider Auvelity if increase in Pristiq is not effective for depression or if increased dose is not well tolerated.  Pt reports that she has some Jornay PM remaining from previous scripts and may try re-starting this to determine if it may help with productivity on days off. Recommended not re-starting a stimulant until she has taken increased dose of Pristiq for one week in case she were to experience increased anxiety, irritability, or sleep disturbance.  Recommend continuing psychotherapy.  Pt to follow-up with this provider in 6-8 weeks or sooner if clinically  indicated.  Patient advised to contact office with any questions, adverse effects, or acute worsening in signs  and symptoms.   Debra was seen today for depression.  Diagnoses and all orders for this visit:  Moderate episode of recurrent major depressive disorder (HCC) -     desvenlafaxine (PRISTIQ) 100 MG 24 hr tablet; Take 1 tablet (100 mg total) by mouth every morning.  Anxiety -     desvenlafaxine (PRISTIQ) 100 MG 24 hr tablet; Take 1 tablet (100 mg total) by mouth every morning.  Attention deficit hyperactivity disorder (ADHD), combined type     Please see After Visit Summary for patient specific instructions.  No future appointments.  No orders of the defined types were placed in this encounter.     -------------------------------

## 2022-03-22 ENCOUNTER — Encounter: Payer: Self-pay | Admitting: Physician Assistant

## 2022-03-22 ENCOUNTER — Ambulatory Visit: Payer: BC Managed Care – PPO | Admitting: Physician Assistant

## 2022-03-22 VITALS — BP 128/79 | HR 77 | Ht 63.0 in | Wt 116.1 lb

## 2022-03-22 DIAGNOSIS — R0683 Snoring: Secondary | ICD-10-CM | POA: Diagnosis not present

## 2022-03-22 DIAGNOSIS — R400 Somnolence: Secondary | ICD-10-CM

## 2022-03-22 NOTE — Progress Notes (Addendum)
I,Sha'taria Tyson,acting as a Education administrator for [employer], PA-C.,have documented all relevant documentation on the behalf of Mikey Kirschner, PA-C,as directed by  Mikey Kirschner, PA-C while in the presence of Mikey Kirschner, PA-C.   Established patient visit   Patient: Debra Garza   DOB: 12-Mar-1980   42 y.o. Female  MRN: [id number] Visit Date: 03/22/2022  Today's healthcare provider: Mikey Kirschner, PA-C   Cc. snoring  Subjective    HPI   Pt returns with concerns of snoring that is persistent despite allergy treatment.  It is now keeping her husband awake at night. She tried a nightguard for snoring/clenching.  Tried breath right strips, adhesive caused reaction.  Denies waking herself up at night.  Still reports daytime sleepiness.      03/22/2022   10:00 AM 04/09/2021   11:00 AM  Results of the [facility] flowsheet  Sitting and reading 2 3  Watching TV 2 2  Sitting, inactive in a public place (e.g. a theatre or a meeting) 1 0  As a passenger in a car for an hour without a break 2 3  Lying down to rest in the afternoon when circumstances permit 3 3  Sitting and talking to someone 2 2  Sitting quietly after a lunch without alcohol 3 1  In a car, while stopped for a few minutes in traffic 1 2  Total score 16 16    Medications: Outpatient Medications Prior to Visit  Medication Sig   albuterol (VENTOLIN HFA) 108 (90 Base) MCG/ACT inhaler Inhale 2 puffs into the lungs as needed.   Aspirin-Acetaminophen-Caffeine (GOODY HEADACHE PO) Take by mouth.   Azelastine HCl 137 MCG/SPRAY SOLN PLACE 1 SPRAY INTO BOTH NOSTRILS 2 (TWO) TIMES DAILY. USE IN EACH NOSTRIL AS DIRECTED   COLLAGEN-VITAMIN C-BIOTIN PO Take by mouth.   CRANBERRY PO Take by mouth.   desvenlafaxine (PRISTIQ) 100 MG 24 hr tablet Take 1 tablet (100 mg total) by mouth every morning.   fexofenadine (ALLEGRA) 180 MG tablet TAKE 1 TABLET BY MOUTH EVERY DAY   fluticasone (FLONASE) 50 MCG/ACT nasal spray Place  into both nostrils as needed.   nitrofurantoin (MACRODANTIN) 100 MG capsule Take 1 capsule (100 mg total) by mouth as needed.   Prenatal Vit-Fe Fumarate-FA (PRENATAL VITAMINS) 28-0.8 MG TABS Take by mouth.   Probiotic Product (PROBIOTIC PO) Take by mouth.   rizatriptan (MAXALT) 10 MG tablet Take 1 tablet (10 mg total) by mouth as needed for migraine. May repeat in 2 hours if needed   [place] Name: Mushroom complex   etonogestrel-ethinyl estradiol (NUVARING) 0.12-0.015 MG/24HR vaginal ring Insert vaginally and leave in place for 3 consecutive weeks, then remove for 1 week. (Patient not taking: Reported on 03/15/2022)   No facility-administered medications prior to visit.    Review of Systems  Constitutional:  Negative for fatigue and fever.  Respiratory:  Negative for cough and shortness of breath.   Cardiovascular:  Negative for chest pain and leg swelling.  Gastrointestinal:  Negative for abdominal pain.  Neurological:  Negative for dizziness and headaches.       Objective    BP 128/79 (BP Location: Right Arm, Patient Position: Sitting, Cuff Size: Normal)   Pulse 77   Ht '5\' 3"'$  (1.6 m)   Wt 116 lb 1.6 oz (52.7 kg)   LMP 03/05/2022   SpO2 100%   BMI 20.57 kg/m   Physical Exam Vitals reviewed.  Constitutional:      Appearance: She is  not ill-appearing.  HENT:     Head: Normocephalic.  Eyes:     Conjunctiva/sclera: Conjunctivae normal.  Cardiovascular:     Rate and Rhythm: Normal rate.  Pulmonary:     Effort: Pulmonary effort is normal. No respiratory distress.  Neurological:     General: No focal deficit present.     Mental Status: She is alert and oriented to person, place, and time.  Psychiatric:        Mood and Affect: Mood normal.        Behavior: Behavior normal.      No results found for any visits on 03/22/22.  Assessment & Plan     Daytime sleepiness Advised at home sleep study If wnl may need ENT referral   Return if symptoms worsen or fail  to improve.     I, Mikey Kirschner, PA-C have reviewed all documentation for this visit. The documentation on  03/22/22 for the exam, diagnosis, procedures, and orders are all accurate and complete.  Mikey Kirschner, PA-C [facility] [address] [place], [place], [address] Office: [phone] Fax: [place]

## 2022-04-09 ENCOUNTER — Encounter: Payer: Self-pay | Admitting: Physician Assistant

## 2022-04-14 ENCOUNTER — Encounter: Payer: Self-pay | Admitting: Physician Assistant

## 2022-05-03 ENCOUNTER — Telehealth: Payer: BC Managed Care – PPO | Admitting: Psychiatry

## 2022-05-05 ENCOUNTER — Encounter: Payer: Self-pay | Admitting: Psychiatry

## 2022-05-05 ENCOUNTER — Telehealth (INDEPENDENT_AMBULATORY_CARE_PROVIDER_SITE_OTHER): Payer: BC Managed Care – PPO | Admitting: Psychiatry

## 2022-05-05 DIAGNOSIS — F902 Attention-deficit hyperactivity disorder, combined type: Secondary | ICD-10-CM

## 2022-05-05 DIAGNOSIS — F3341 Major depressive disorder, recurrent, in partial remission: Secondary | ICD-10-CM

## 2022-05-05 DIAGNOSIS — F419 Anxiety disorder, unspecified: Secondary | ICD-10-CM | POA: Diagnosis not present

## 2022-05-05 MED ORDER — DESVENLAFAXINE SUCCINATE ER 100 MG PO TB24: 100.0000 mg | ORAL_TABLET | Freq: Every morning | ORAL | 1 refills | Status: DC

## 2022-05-05 NOTE — Progress Notes (Signed)
Debra Garza [id number] [birth year] 42 y.o.  Virtual Visit via Video Note  I connected with pt @ on 05/05/22 at 11:30 AM EDT by a video enabled telemedicine application and verified that I am speaking with the correct person using two identifiers.   I discussed the limitations of evaluation and management by telemedicine and the availability of in person appointments. The patient expressed understanding and agreed to proceed.  I discussed the assessment and treatment plan with the patient. The patient was provided an opportunity to ask questions and all were answered. The patient agreed with the plan and demonstrated an understanding of the instructions.   The patient was advised to call back or seek an in-person evaluation if the symptoms worsen or if the condition fails to improve as anticipated.  I provided 25 minutes of non-face-to-face time during this encounter.  The patient was located at home.  The provider was located at [facility].   Debra Garza, PMHNP   Subjective:   Patient ID:  Debra Garza is a 41 y.o. (DOB 1980-05-26) female.  Chief Complaint:  Chief Complaint  Patient presents with   Follow-up    Depression, anxiety, and ADHD    HPI [name] Pecinich Douse presents for follow-up of anxiety, depression, and ADHD. She reports that increase in Pristiq has been helpful. Denies depressed mood. Reports anxiety has been "a little less." She has been able to handle some situations without as much anxiety as she has had in the past. She reports that she has been getting more organized and feeling like she is "getting it together" at work. She is trying to lay things out in advance and stay off her phone. Has started showering at night to save time in the morning. She is doing yoga and a work out in the morning. Appetite has been ok. Energy has been ok. She reports that she had a sleep study. Denies difficulty falling or staying asleep. She  reports difficulty waking up regardless of sleep amount. Sleeping 7.5 hours on days off. Sleeping about 5.5 hours when working.  Concentration has been ok. Occ loses track of time and uses timers. Denies SI.   Denies any recent major psychosocial stressors.   Enjoyed recent day trip.  Past medication trials: Vyvanse-effective Dexedrine-effective Adderall- mood swings, tearfulness and irritability Methylphenidate-effective for only brief periods of time Concerta-increased blood pressure Strattera-increased blood pressure. Ineffective.  Ophelia Charter PM- Minimally effective Clonidine- Severe drowsiness Mirtazapine-low energy Wellbutrin XL-helpful for mood at 150 mg daily.  Had increased anxiety at 300 mg dose. Trintellix-Has been helpful for mood and anxiety.  Prozac Pristiq  Review of Systems:  Review of Systems  Cardiovascular:  Negative for palpitations.  Musculoskeletal:  Negative for gait problem.  Neurological:  Negative for tremors and headaches.  Psychiatric/Behavioral:         Please refer to HPI    Medications: I have reviewed the patient's current medications.  Current Outpatient Medications  Medication Sig Dispense Refill   acetaminophen (TYLENOL) 325 MG tablet Take 650 mg by mouth every 6 (six) hours as needed.     Aspirin-Acetaminophen-Caffeine (GOODY HEADACHE PO) Take by mouth.     Azelastine HCl 137 MCG/SPRAY SOLN PLACE 1 SPRAY INTO BOTH NOSTRILS 2 (TWO) TIMES DAILY. USE IN EACH NOSTRIL AS DIRECTED 30 mL 1   COLLAGEN-VITAMIN C-BIOTIN PO Take by mouth.     CRANBERRY PO Take by mouth.     fexofenadine (ALLEGRA) 180 MG tablet TAKE 1 TABLET BY MOUTH EVERY DAY  90 tablet 3   fluticasone (FLONASE) 50 MCG/ACT nasal spray Place into both nostrils as needed.     nitrofurantoin (MACRODANTIN) 100 MG capsule Take 1 capsule (100 mg total) by mouth as needed. 30 capsule 6   Prenatal Vit-Fe Fumarate-FA (PRENATAL VITAMINS) 28-0.8 MG TABS Take by mouth.     Probiotic Product  (PROBIOTIC PO) Take by mouth.     rizatriptan (MAXALT) 10 MG tablet Take 1 tablet (10 mg total) by mouth as needed for migraine. May repeat in 2 hours if needed 10 tablet 0   UNABLE TO FIND Med Name: Mushroom complex     albuterol (VENTOLIN HFA) 108 (90 Base) MCG/ACT inhaler Inhale 2 puffs into the lungs as needed. 1 each 5   desvenlafaxine (PRISTIQ) 100 MG 24 hr tablet Take 1 tablet (100 mg total) by mouth every morning. 90 tablet 1   etonogestrel-ethinyl estradiol (NUVARING) 0.12-0.015 MG/24HR vaginal ring Insert vaginally and leave in place for 3 consecutive weeks, then remove for 1 week. (Patient not taking: Reported on 03/15/2022) 3 each 3   No current facility-administered medications for this visit.    Medication Side Effects: None  Allergies:  Allergies  Allergen Reactions   Banana Anaphylaxis   Nystatin Anaphylaxis, Shortness Of Breath and Other (See Comments)    Banana Flavoring   Other Anaphylaxis    Chickpeas   Penicillins Hives   Pseudoephedrine Hcl    Sudafed [Pseudoephedrine Hcl]    Codeine Nausea And Vomiting    Other reaction(s): Vomiting   Hydrocodone Other (See Comments) and Nausea And Vomiting    Other reaction(s): Other (See Comments)   Tape Rash    Past Medical History:  Diagnosis Date   ADD (attention deficit disorder)    Allergic rhinitis    Anxiety    Asthma    Depression    DR. KAUR   Insomnia    Migraine    Other abnormal glucose    SEASONAL ALLERGIES   Panic attacks    Strabismic amblyopia of right eye     Family History  Problem Relation Age of Onset   Healthy Mother    Cancer Mother        BASEL CELL 96   Breast cancer Mother 57       DCIS   Healthy Father    Hyperlipidemia Father    Cancer Father        BASEL CELL 3   ADD / ADHD Father    Healthy Sister    Depression Sister    Liver cancer Maternal Grandmother    Cancer Maternal Grandmother        COLON   Schizophrenia Maternal Grandmother    Dementia Maternal Grandfather      Social History   Socioeconomic History   Marital status: Married    Spouse name: [name]   Number of children: 0   Years of education: 16   Highest education level: Not on file  Occupational History   Occupation: NURSE  Tobacco Use   Smoking status: Never   Smokeless tobacco: Never  Vaping Use   Vaping Use: Never used  Substance and Sexual Activity   Alcohol use: Yes    Alcohol/week: 1.0 - 2.0 standard drink of alcohol    Types: 1 - 2 Cans of beer per week    Comment: occasionally   Drug use: No   Sexual activity: Yes    Partners: Male    Birth control/protection: Inserts    Comment: Nuvaring  Other Topics Concern   Not on file  Social History Narrative   Not on file   Social Determinants of Health   Financial Resource Strain: Not on file  Food Insecurity: Not on file  Transportation Needs: Not on file  Physical Activity: Not on file  Stress: Not on file  Social Connections: Not on file  Intimate Partner Violence: Not on file    Past Medical History, Surgical history, Social history, and Family history were reviewed and updated as appropriate.   Please see review of systems for further details on the patient's review from today.   Objective:   Physical Exam:  There were no vitals taken for this visit.  Physical Exam Neurological:     Mental Status: She is alert and oriented to person, place, and time.     Cranial Nerves: No dysarthria.  Psychiatric:        Attention and Perception: Attention and perception normal.        Mood and Affect: Mood normal.        Speech: Speech normal.        Behavior: Behavior is cooperative.        Thought Content: Thought content normal. Thought content is not paranoid or delusional. Thought content does not include homicidal or suicidal ideation. Thought content does not include homicidal or suicidal plan.        Cognition and Memory: Cognition and memory normal.        Judgment: Judgment normal.     Comments: Insight  intact     Lab Review:     Component Value Date/Time   NA 139 10/06/2021 0915   K 4.6 10/06/2021 0915   CL 104 10/06/2021 0915   CO2 21 10/06/2021 0915   GLUCOSE 88 10/06/2021 0915   GLUCOSE 125 (H) 10/23/2015 0146   BUN 12 10/06/2021 0915   CREATININE 0.72 10/06/2021 0915   CALCIUM 8.9 10/06/2021 0915   PROT 6.7 10/06/2021 0915   ALBUMIN 4.1 10/06/2021 0915   AST 16 10/06/2021 0915   ALT 14 10/06/2021 0915   ALKPHOS 63 10/06/2021 0915   BILITOT 0.4 10/06/2021 0915   GFRNONAA >60 10/23/2015 0146   GFRAA >60 10/23/2015 0146       Component Value Date/Time   WBC 5.3 10/06/2021 0915   WBC 9.2 10/23/2015 0146   RBC 4.68 10/06/2021 0915   RBC 5.42 (H) 10/23/2015 0146   HGB 14.4 10/06/2021 0915   HCT 41.6 10/06/2021 0915   PLT 258 10/06/2021 0915   MCV 89 10/06/2021 0915   MCH 30.8 10/06/2021 0915   MCH 29.6 10/23/2015 0146   MCHC 34.6 10/06/2021 0915   MCHC 34.9 10/23/2015 0146   RDW 12.3 10/06/2021 0915   LYMPHSABS 1.9 10/06/2021 0915   EOSABS 0.1 10/06/2021 0915   BASOSABS 0.0 10/06/2021 0915    No results found for: "POCLITH", "LITHIUM"   No results found for: "PHENYTOIN", "PHENOBARB", "VALPROATE", "CBMZ"   .res Assessment: Plan:    Pt seen for 25 minutes and time spent discussing response to increase in Pristiq. She reports improved anxiety and depressive s/s since increase in Pristiq to 100 mg daily. She reports that her mood and anxiety symptoms are now well controlled with Pristiq 100 mg daily and would like to continue this dose.  Continue Pristiq 100 mg daily for depression and anxiety.  Pt to follow-up in 6 months or sooner if clinically indicated.   Patient advised to contact office with any questions, adverse effects,  or acute worsening in signs and symptoms.   [place] was seen today for follow-up.  Diagnoses and all orders for this visit:  Anxiety -     desvenlafaxine (PRISTIQ) 100 MG 24 hr tablet; Take 1 tablet (100 mg total) by mouth every  morning.  Recurrent major depressive disorder, in partial remission -     desvenlafaxine (PRISTIQ) 100 MG 24 hr tablet; Take 1 tablet (100 mg total) by mouth every morning.  Attention deficit hyperactivity disorder (ADHD), combined type     Please see After Visit Summary for patient specific instructions.  No future appointments.  No orders of the defined types were placed in this encounter.     -------------------------------

## 2022-06-11 ENCOUNTER — Ambulatory Visit: Payer: Self-pay

## 2022-06-11 ENCOUNTER — Encounter: Payer: Self-pay | Admitting: Physician Assistant

## 2022-06-11 ENCOUNTER — Telehealth: Payer: BC Managed Care – PPO | Admitting: Physician Assistant

## 2022-06-11 DIAGNOSIS — R600 Localized edema: Secondary | ICD-10-CM

## 2022-06-11 MED ORDER — KETOTIFEN FUMARATE 0.035 % OP SOLN: 2.0000 [drp] | Freq: Two times a day (BID) | OPHTHALMIC | 1 refills | Status: AC

## 2022-06-11 MED ORDER — PREDNISONE 20 MG PO TABS: 20.0000 mg | ORAL_TABLET | Freq: Every day | ORAL | 0 refills | Status: DC

## 2022-06-11 NOTE — Telephone Encounter (Signed)
  Chief Complaint: Periorbital eye swelling - allergic reaction Symptoms: above Frequency: yesterday Pertinent Negatives: Patient denies fever, other facial swelling Disposition: [] ED /[] Urgent Care (no appt availability in office) / [x] Appointment(In office/virtual)/ []  [facility]/ [] Home Care/ [] Refused Recommended Disposition /[] [facility]/ []  Follow-up with PCP Additional Notes: Pt believes that this is an allergic reaction. Pt is unsure what may have started this. VV scheduled as pt cannot leave work.     Reason for Disposition  [1] SEVERE eyelid swelling (i.e., shut or almost) AND [2] involves both eyes (Exception: Itchy eyes, which  are probably an allergic reaction.)  Answer Assessment - Initial Assessment Questions 1. ONSET: "When did the swelling start?" (e.g., minutes, hours, days)     Yesterday 2. LOCATION: "What part of the eyelids is swollen?"     Eyelids 3. SEVERITY: "How swollen is it?"     Moderate 6. FEVER: "Do you have a fever?" If Yes, ask: "What is it, how was it measured, and when did it start?"      no 7. CAUSE: "What do you think is causing the swelling?"     Allergic reaction 8. RECURRENT SYMPTOM: "Have you had eyelid swelling before?" If Yes, ask: "When was the last time?" "What happened that time?"     Yes - allergic reaction  Protocols used: Eye - Swelling-A-AH

## 2022-06-11 NOTE — Progress Notes (Signed)
I,Sulibeya S Dimas,acting as a Neurosurgeon for [employer], PA-C.,have documented all relevant documentation on the behalf of Alfredia Ferguson, PA-C,as directed by  Alfredia Ferguson, PA-C while in the presence of Alfredia Ferguson, PA-C.  MyChart Video Visit    Virtual Visit via Video Note   This format is felt to be most appropriate for this patient at this time. Physical exam was limited by quality of the video and audio technology used for the visit.   Patient location: work, alone Provider location: home  I discussed the limitations of evaluation and management by telemedicine and the availability of in person appointments. The patient expressed understanding and agreed to proceed.  Patient: Debra Garza   DOB: Feb 22, 1980   42 y.o. Female  MRN: [id number] Visit Date: 06/11/2022  Today's healthcare provider: Alfredia Ferguson, PA-C   Cc. Eye swelling, itching  Subjective    HPI  Patient C/O eyes being "puffy" x a few days She reports eye itching, and redness. She reports seeing allergist in January. She reports they were not able to figure out what was causing the allergy. Patient requesting steroids.    Medications: Outpatient Medications Prior to Visit  Medication Sig   acetaminophen (TYLENOL) 325 MG tablet Take 650 mg by mouth every 6 (six) hours as needed.   albuterol (VENTOLIN HFA) 108 (90 Base) MCG/ACT inhaler Inhale 2 puffs into the lungs as needed.   Aspirin-Acetaminophen-Caffeine (GOODY HEADACHE PO) Take by mouth.   Azelastine HCl 137 MCG/SPRAY SOLN PLACE 1 SPRAY INTO BOTH NOSTRILS 2 (TWO) TIMES DAILY. USE IN EACH NOSTRIL AS DIRECTED   COLLAGEN-VITAMIN C-BIOTIN PO Take by mouth.   CRANBERRY PO Take by mouth.   desvenlafaxine (PRISTIQ) 100 MG 24 hr tablet Take 1 tablet (100 mg total) by mouth every morning.   fexofenadine (ALLEGRA) 180 MG tablet TAKE 1 TABLET BY MOUTH EVERY DAY   fluticasone (FLONASE) 50 MCG/ACT nasal spray Place into both nostrils as needed.    nitrofurantoin (MACRODANTIN) 100 MG capsule Take 1 capsule (100 mg total) by mouth as needed.   Prenatal Vit-Fe Fumarate-FA (PRENATAL VITAMINS) 28-0.8 MG TABS Take by mouth.   Probiotic Product (PROBIOTIC PO) Take by mouth.   rizatriptan (MAXALT) 10 MG tablet Take 1 tablet (10 mg total) by mouth as needed for migraine. May repeat in 2 hours if needed   UNABLE TO FIND Med Name: Mushroom complex   [DISCONTINUED] etonogestrel-ethinyl estradiol (NUVARING) 0.12-0.015 MG/24HR vaginal ring Insert vaginally and leave in place for 3 consecutive weeks, then remove for 1 week. (Patient not taking: Reported on 03/15/2022)   No facility-administered medications prior to visit.    Review of Systems  HENT:  Negative for congestion and rhinorrhea.   Eyes:  Positive for redness and itching. Negative for photophobia, pain, discharge and visual disturbance.  Respiratory:  Negative for cough, chest tightness, shortness of breath and wheezing.   Allergic/Immunologic: Positive for environmental allergies.    Objective    There were no vitals taken for this visit.    Physical Exam Constitutional:      Appearance: Normal appearance. She is not ill-appearing.  Neurological:     Mental Status: She is oriented to person, place, and time.  Psychiatric:        Mood and Affect: Mood normal.        Behavior: Behavior normal.   Unable to eval eyes d/t quality of video     Assessment & Plan     1. Periorbital edema Rx steroid burst,  recommended otc zaditor eyedrops  - predniSONE (DELTASONE) 20 MG tablet; Take 1 tablet (20 mg total) by mouth daily with breakfast.  Dispense: 5 tablet; Refill: 0 - ketotifen (ZADITOR) 0.035 % ophthalmic solution; Place 2 drops into both eyes in the morning and at bedtime.  Dispense: 10 mL; Refill: 1   Return if symptoms worsen or fail to improve.     I discussed the assessment and treatment plan with the patient. The patient was provided an opportunity to ask questions and  all were answered. The patient agreed with the plan and demonstrated an understanding of the instructions.   The patient was advised to call back or seek an in-person evaluation if the symptoms worsen or if the condition fails to improve as anticipated.  I provided 8 minutes of non-face-to-face time during this encounter.  I, Alfredia Ferguson, PA-C have reviewed all documentation for this visit. The documentation on  06/11/22   for the exam, diagnosis, procedures, and orders are all accurate and complete.  Alfredia Ferguson, PA-C [facility] [address] [place], [place], [address] Office: [phone] Fax: [phone]   [facility]

## 2022-07-02 ENCOUNTER — Other Ambulatory Visit: Payer: Self-pay | Admitting: Physician Assistant

## 2022-07-02 DIAGNOSIS — J302 Other seasonal allergic rhinitis: Secondary | ICD-10-CM

## 2022-07-13 ENCOUNTER — Encounter (INDEPENDENT_AMBULATORY_CARE_PROVIDER_SITE_OTHER): Payer: BC Managed Care – PPO | Admitting: Physician Assistant

## 2022-07-13 DIAGNOSIS — R600 Localized edema: Secondary | ICD-10-CM | POA: Diagnosis not present

## 2022-07-13 NOTE — Telephone Encounter (Signed)

## 2022-07-18 ENCOUNTER — Other Ambulatory Visit: Payer: Self-pay | Admitting: Physician Assistant

## 2022-07-18 DIAGNOSIS — J302 Other seasonal allergic rhinitis: Secondary | ICD-10-CM

## 2022-11-04 ENCOUNTER — Telehealth (INDEPENDENT_AMBULATORY_CARE_PROVIDER_SITE_OTHER): Payer: BC Managed Care – PPO | Admitting: Psychiatry

## 2022-11-04 ENCOUNTER — Encounter: Payer: Self-pay | Admitting: Psychiatry

## 2022-11-04 DIAGNOSIS — F3341 Major depressive disorder, recurrent, in partial remission: Secondary | ICD-10-CM

## 2022-11-04 DIAGNOSIS — F419 Anxiety disorder, unspecified: Secondary | ICD-10-CM

## 2022-11-04 DIAGNOSIS — F902 Attention-deficit hyperactivity disorder, combined type: Secondary | ICD-10-CM

## 2022-11-04 MED ORDER — DESVENLAFAXINE SUCCINATE ER 100 MG PO TB24
100.0000 mg | ORAL_TABLET | Freq: Every morning | ORAL | 1 refills | Status: AC
Start: 2022-11-04 — End: ?

## 2022-11-04 NOTE — Progress Notes (Signed)
Debra Garza [id number] 1981-01-17 42 y.o.  Virtual Visit via Video Note  I connected with pt @ on 11/04/22 at  9:30 AM EDT by a video enabled telemedicine application and verified that I am speaking with the correct person using two identifiers.   I discussed the limitations of evaluation and management by telemedicine and the availability of in person appointments. The patient expressed understanding and agreed to proceed.  I discussed the assessment and treatment plan with the patient. The patient was provided an opportunity to ask questions and all were answered. The patient agreed with the plan and demonstrated an understanding of the instructions.   The patient was advised to call back or seek an in-person evaluation if the symptoms worsen or if the condition fails to improve as anticipated.  I provided 31 minutes of non-face-to-face time during this encounter.  The patient was located at home.  The provider was located at [facility].   Corie Chiquito, PMHNP   Subjective:   Patient ID:  Debra Garza is a 42 y.o. (DOB March 10, 1980) female.  Chief Complaint:  Chief Complaint  Patient presents with   Follow-up    Anxiety, depression, and ADHD    HPI Debra Garza presents for follow-up of anxiety, depression, and ADHD. She reports that she has been doing ok overall. She reports that she is still having difficulty with getting out of bed- "that's my regular." She reports that she is easily distracted in the mornings and loses track of time. She reports that concentration is ok. She reports that she notices some "COVID brain fog." Denies depressed mood. Energy and motivation have been ok. They are in the process of preparing to sell their house. She has been painting rooms, downsizing, and cleaning. She reports that her anxiety has been ok. She reports that she was picking at her skin and started NAC and has husband keep tweezers. Sleeping  well. Appetite has been ok. Denies SI.   Had COVID 2 weeks ago.   Has not been able to go to the gym as often with getting house ready to sell.   Seeing individual therapist weekly. Also seeing couples therapist.   Past medication trials: Vyvanse-effective Dexedrine-effective Adderall- mood swings, tearfulness and irritability Methylphenidate-effective for only brief periods of time Concerta-increased blood pressure Strattera-increased blood pressure. Ineffective.  Ophelia Charter PM- Minimally effective Clonidine- Severe drowsiness Mirtazapine-low energy Wellbutrin XL-helpful for mood at 150 mg daily.  Had increased anxiety at 300 mg dose. Trintellix-Has been helpful for mood and anxiety.  Prozac Pristiq-Effective  Review of Systems:  Review of Systems  HENT:         Reports mouthguard has been helpful for clinching/grinding.   Musculoskeletal:  Negative for gait problem.  Neurological:        Improved headaches with wearing mouthguard at night.   Psychiatric/Behavioral:         Please refer to HPI    Medications: I have reviewed the patient's current medications.  Current Outpatient Medications  Medication Sig Dispense Refill   acetaminophen (TYLENOL) 325 MG tablet Take 650 mg by mouth every 6 (six) hours as needed.     Acetylcysteine 600 MG CAPS Take by mouth.     albuterol (VENTOLIN HFA) 108 (90 Base) MCG/ACT inhaler Inhale 2 puffs into the lungs as needed. 1 each 5   Aspirin-Acetaminophen-Caffeine (GOODY HEADACHE PO) Take by mouth.     Azelastine HCl 137 MCG/SPRAY SOLN PLACE 1 SPRAY INTO BOTH NOSTRILS 2 (TWO) TIMES DAILY.  USE IN EACH NOSTRIL AS DIRECTED 90 mL 1   COLLAGEN-VITAMIN C-BIOTIN PO Take by mouth.     CRANBERRY PO Take by mouth.     fexofenadine (ALLEGRA) 180 MG tablet TAKE 1 TABLET BY MOUTH EVERY DAY 90 tablet 3   fluticasone (FLONASE) 50 MCG/ACT nasal spray Place into both nostrils as needed.     ibuprofen (ADVIL) 400 MG tablet Take 400 mg by mouth every 6 (six)  hours as needed.     nitrofurantoin (MACRODANTIN) 100 MG capsule Take 1 capsule (100 mg total) by mouth as needed. 30 capsule 6   Prenatal Vit-Fe Fumarate-FA (PRENATAL VITAMINS) 28-0.8 MG TABS Take by mouth.     Probiotic Product (PROBIOTIC PO) Take by mouth.     rizatriptan (MAXALT) 10 MG tablet Take 1 tablet (10 mg total) by mouth as needed for migraine. May repeat in 2 hours if needed 10 tablet 0   UNABLE TO FIND Med Name: Mushroom complex     desvenlafaxine (PRISTIQ) 100 MG 24 hr tablet Take 1 tablet (100 mg total) by mouth every morning. 90 tablet 1   ketotifen (ZADITOR) 0.035 % ophthalmic solution Place 2 drops into both eyes in the morning and at bedtime. (Patient taking differently: Place 2 drops into both eyes as needed.) 10 mL 1   No current facility-administered medications for this visit.    Medication Side Effects: None  Allergies:  Allergies  Allergen Reactions   Banana Anaphylaxis   Nystatin Anaphylaxis, Shortness Of Breath and Other (See Comments)    Banana Flavoring   Other Anaphylaxis    Chickpeas   Penicillins Hives   Pseudoephedrine Hcl    Sudafed [Pseudoephedrine Hcl]    Codeine Nausea And Vomiting    Other reaction(s): Vomiting   Hydrocodone Other (See Comments) and Nausea And Vomiting    Other reaction(s): Other (See Comments)   Tape Rash    Past Medical History:  Diagnosis Date   ADD (attention deficit disorder)    Allergic rhinitis    Anxiety    Asthma    Depression    DR. KAUR   Insomnia    Migraine    Other abnormal glucose    SEASONAL ALLERGIES   Panic attacks    Strabismic amblyopia of right eye     Family History  Problem Relation Age of Onset   Healthy Mother    Cancer Mother        BASEL CELL 62   Breast cancer Mother 12       DCIS   Healthy Father    Hyperlipidemia Father    Cancer Father        BASEL CELL 73   ADD / ADHD Father    Healthy Sister    Depression Sister    Liver cancer Maternal Grandmother    Cancer Maternal  Grandmother        COLON   Schizophrenia Maternal Grandmother    Dementia Maternal Grandfather     Social History   Socioeconomic History   Marital status: Married    Spouse name: [name]   Number of children: 0   Years of education: 16   Highest education level: Not on file  Occupational History   Occupation: NURSE  Tobacco Use   Smoking status: Never   Smokeless tobacco: Never  Vaping Use   Vaping status: Never Used  Substance and Sexual Activity   Alcohol use: Yes    Alcohol/week: 1.0 - 2.0 standard drink of alcohol  Types: 1 - 2 Cans of beer per week    Comment: occasionally   Drug use: No   Sexual activity: Yes    Partners: Male    Birth control/protection: Inserts    Comment: Nuvaring  Other Topics Concern   Not on file  Social History Narrative   Not on file   Social Determinants of Health   Financial Resource Strain: Not on file  Food Insecurity: Not on file  Transportation Needs: Not on file  Physical Activity: Not on file  Stress: Not on file  Social Connections: Not on file  Intimate Partner Violence: Not on file    Past Medical History, Surgical history, Social history, and Family history were reviewed and updated as appropriate.   Please see review of systems for further details on the patient's review from today.   Objective:   Physical Exam:  There were no vitals taken for this visit.  Physical Exam Neurological:     Mental Status: She is alert and oriented to person, place, and time.     Cranial Nerves: No dysarthria.  Psychiatric:        Attention and Perception: Attention and perception normal.        Mood and Affect: Mood normal.        Speech: Speech normal.        Behavior: Behavior is cooperative.        Thought Content: Thought content normal. Thought content is not paranoid or delusional. Thought content does not include homicidal or suicidal ideation. Thought content does not include homicidal or suicidal plan.        Cognition  and Memory: Cognition and memory normal.        Judgment: Judgment normal.     Comments: Insight intact     Lab Review:     Component Value Date/Time   NA 139 10/06/2021 0915   K 4.6 10/06/2021 0915   CL 104 10/06/2021 0915   CO2 21 10/06/2021 0915   GLUCOSE 88 10/06/2021 0915   GLUCOSE 125 (H) 10/23/2015 0146   BUN 12 10/06/2021 0915   CREATININE 0.72 10/06/2021 0915   CALCIUM 8.9 10/06/2021 0915   PROT 6.7 10/06/2021 0915   ALBUMIN 4.1 10/06/2021 0915   AST 16 10/06/2021 0915   ALT 14 10/06/2021 0915   ALKPHOS 63 10/06/2021 0915   BILITOT 0.4 10/06/2021 0915   GFRNONAA >60 10/23/2015 0146   GFRAA >60 10/23/2015 0146       Component Value Date/Time   WBC 5.3 10/06/2021 0915   WBC 9.2 10/23/2015 0146   RBC 4.68 10/06/2021 0915   RBC 5.42 (H) 10/23/2015 0146   HGB 14.4 10/06/2021 0915   HCT 41.6 10/06/2021 0915   PLT 258 10/06/2021 0915   MCV 89 10/06/2021 0915   MCH 30.8 10/06/2021 0915   MCH 29.6 10/23/2015 0146   MCHC 34.6 10/06/2021 0915   MCHC 34.9 10/23/2015 0146   RDW 12.3 10/06/2021 0915   LYMPHSABS 1.9 10/06/2021 0915   EOSABS 0.1 10/06/2021 0915   BASOSABS 0.0 10/06/2021 0915    No results found for: "POCLITH", "LITHIUM"   No results found for: "PHENYTOIN", "PHENOBARB", "VALPROATE", "CBMZ"   .res Assessment: Plan:    33 minutes spent dedicated to the care of this patient on the date of this encounter to include pre-visit review of records, ordering of medication, post visit documentation, and face-to-face time with the patient discussing response to medication and recent difficulties with concentration. Pt reports that  she prefers to continue current medications without changes and would like to use reminders and other behavioral strategies to improve focus.  Will continue Pristiq 100 mg daily for depression and anxiety.  Pt to follow-up in 6 months or sooner if clinically indicated.  Recommend continuing psychotherapy.  Patient advised to contact  office with any questions, adverse effects, or acute worsening in signs and symptoms.    Debra was seen today for follow-up.  Diagnoses and all orders for this visit:  Anxiety -     desvenlafaxine (PRISTIQ) 100 MG 24 hr tablet; Take 1 tablet (100 mg total) by mouth every morning.  Recurrent major depressive disorder, in partial remission (HCC) -     desvenlafaxine (PRISTIQ) 100 MG 24 hr tablet; Take 1 tablet (100 mg total) by mouth every morning.  Attention deficit hyperactivity disorder (ADHD), combined type     Please see After Visit Summary for patient specific instructions.  No future appointments.   No orders of the defined types were placed in this encounter.     -------------------------------

## 2022-12-08 ENCOUNTER — Encounter: Payer: Self-pay | Admitting: Psychiatry

## 2022-12-22 ENCOUNTER — Encounter: Payer: Self-pay | Admitting: Family Medicine

## 2022-12-22 ENCOUNTER — Ambulatory Visit: Payer: BC Managed Care – PPO | Admitting: Family Medicine

## 2022-12-22 VITALS — BP 114/72 | HR 99 | Wt 116.0 lb

## 2022-12-22 DIAGNOSIS — G4730 Sleep apnea, unspecified: Secondary | ICD-10-CM | POA: Insufficient documentation

## 2022-12-22 DIAGNOSIS — R0683 Snoring: Secondary | ICD-10-CM | POA: Insufficient documentation

## 2022-12-22 DIAGNOSIS — R400 Somnolence: Secondary | ICD-10-CM | POA: Diagnosis not present

## 2022-12-22 DIAGNOSIS — F3341 Major depressive disorder, recurrent, in partial remission: Secondary | ICD-10-CM | POA: Diagnosis not present

## 2022-12-22 NOTE — Assessment & Plan Note (Signed)
Chronic, untreated Likely sinus related Referral to ENT to assist Pt declines referral to allergy at this time Takes OTC antihistamines

## 2022-12-22 NOTE — Assessment & Plan Note (Signed)
Chronic, stable F/b Psych; continues on pristiq 100 mg daily

## 2022-12-22 NOTE — Progress Notes (Signed)
Established patient visit  Patient: Debra Garza   DOB: 1980-12-13   42 y.o. Female  MRN: [id number] Visit Date: 12/22/2022  Today's healthcare provider: Jacky Kindle, FNP  Re Introduced to nurse practitioner role and practice setting.  All questions answered.  Discussed provider/patient relationship and expectations.  Subjective    HPI HPI   Pt stated--snoring is getting worse, choking, grinding teeth while sleep.-6 months Last edited by Shelly Bombard, CMA on 12/22/2022  3:07 PM.      Medications: Outpatient Medications Prior to Visit  Medication Sig   acetaminophen (TYLENOL) 325 MG tablet Take 650 mg by mouth every 6 (six) hours as needed.   Acetylcysteine 600 MG CAPS Take by mouth.   albuterol (VENTOLIN HFA) 108 (90 Base) MCG/ACT inhaler Inhale 2 puffs into the lungs as needed.   Aspirin-Acetaminophen-Caffeine (GOODY HEADACHE PO) Take by mouth.   Azelastine HCl 137 MCG/SPRAY SOLN PLACE 1 SPRAY INTO BOTH NOSTRILS 2 (TWO) TIMES DAILY. USE IN EACH NOSTRIL AS DIRECTED   COLLAGEN-VITAMIN C-BIOTIN PO Take by mouth.   CRANBERRY PO Take by mouth.   desvenlafaxine (PRISTIQ) 100 MG 24 hr tablet Take 1 tablet (100 mg total) by mouth every morning.   fexofenadine (ALLEGRA) 180 MG tablet TAKE 1 TABLET BY MOUTH EVERY DAY   fluticasone (FLONASE) 50 MCG/ACT nasal spray Place into both nostrils as needed.   ibuprofen (ADVIL) 400 MG tablet Take 400 mg by mouth every 6 (six) hours as needed.   ketotifen (ZADITOR) 0.035 % ophthalmic solution Place 2 drops into both eyes in the morning and at bedtime. (Patient taking differently: Place 2 drops into both eyes as needed.)   nitrofurantoin (MACRODANTIN) 100 MG capsule Take 1 capsule (100 mg total) by mouth as needed.   Prenatal Vit-Fe Fumarate-FA (PRENATAL VITAMINS) 28-0.8 MG TABS Take by mouth.   Probiotic Product (PROBIOTIC PO) Take by mouth.   rizatriptan (MAXALT) 10 MG tablet Take 1 tablet (10 mg total) by mouth as needed for  migraine. May repeat in 2 hours if needed   UNABLE TO FIND Med Name: Mushroom complex   No facility-administered medications prior to visit.   Last CBC Lab Results  Component Value Date   WBC 5.3 10/06/2021   HGB 14.4 10/06/2021   HCT 41.6 10/06/2021   MCV 89 10/06/2021   MCH 30.8 10/06/2021   RDW 12.3 10/06/2021   PLT 258 10/06/2021   Last metabolic panel Lab Results  Component Value Date   GLUCOSE 88 10/06/2021   NA 139 10/06/2021   K 4.6 10/06/2021   CL 104 10/06/2021   CO2 21 10/06/2021   BUN 12 10/06/2021   CREATININE 0.72 10/06/2021   EGFR 110 10/06/2021   CALCIUM 8.9 10/06/2021   PROT 6.7 10/06/2021   ALBUMIN 4.1 10/06/2021   LABGLOB 2.6 10/06/2021   AGRATIO 1.6 10/06/2021   BILITOT 0.4 10/06/2021   ALKPHOS 63 10/06/2021   AST 16 10/06/2021   ALT 14 10/06/2021   ANIONGAP 8 10/23/2015   Last lipids Lab Results  Component Value Date   CHOL 155 10/06/2021   HDL 64 10/06/2021   LDLCALC 80 10/06/2021   TRIG 49 10/06/2021   CHOLHDL 2.4 10/06/2021   Last thyroid functions Lab Results  Component Value Date   TSH 1.100 04/09/2021     Objective    BP 114/72 (BP Location: Right Arm, Patient Position: Sitting, Cuff Size: Normal)   Pulse 99   Wt 116 lb (52.6 kg)  SpO2 98%   BMI 20.55 kg/m   BP Readings from Last 3 Encounters:  12/22/22 114/72  03/22/22 128/79  10/05/21 (!) 145/83   Wt Readings from Last 3 Encounters:  12/22/22 116 lb (52.6 kg)  03/22/22 116 lb 1.6 oz (52.7 kg)  10/05/21 118 lb 14.4 oz (53.9 kg)   SpO2 Readings from Last 3 Encounters:  12/22/22 98%  03/22/22 100%  10/05/21 99%   Physical Exam Vitals and nursing note reviewed.  Constitutional:      General: She is not in acute distress.    Appearance: Normal appearance. She is normal weight. She is not ill-appearing, toxic-appearing or diaphoretic.  HENT:     Head: Normocephalic and atraumatic.  Cardiovascular:     Rate and Rhythm: Normal rate and regular rhythm.      Pulses: Normal pulses.     Heart sounds: Normal heart sounds. No murmur heard.    No friction rub. No gallop.  Pulmonary:     Effort: Pulmonary effort is normal. No respiratory distress.     Breath sounds: Normal breath sounds. No stridor. No wheezing, rhonchi or rales.  Chest:     Chest wall: No tenderness.  Musculoskeletal:        General: No swelling, tenderness, deformity or signs of injury. Normal range of motion.     Cervical back: Normal range of motion and neck supple.     Right lower leg: No edema.     Left lower leg: No edema.  Skin:    General: Skin is warm and dry.     Capillary Refill: Capillary refill takes less than 2 seconds.     Coloration: Skin is not jaundiced or pale.     Findings: No bruising, erythema, lesion or rash.  Neurological:     General: No focal deficit present.     Mental Status: She is alert and oriented to person, place, and time. Mental status is at baseline.     Cranial Nerves: No cranial nerve deficit.     Sensory: No sensory deficit.     Motor: No weakness.     Coordination: Coordination normal.  Psychiatric:        Mood and Affect: Mood normal.        Behavior: Behavior normal.        Thought Content: Thought content normal.        Judgment: Judgment normal.     No results found for any visits on 12/22/22.  Assessment & Plan     Problem List Items Addressed This Visit       Respiratory   Mild sleep apnea    Chronic, untreated Likely sinus related Referral to ENT to assist Pt declines referral to allergy at this time Takes OTC antihistamines         Other   Daytime sleepiness   Relevant Orders   Ambulatory referral to ENT   Recurrent major depressive disorder, in partial remission (HCC)    Chronic, stable F/b Psych; continues on pristiq 100 mg daily      Snoring - Primary    Chronic, worsening x 6 months Worse with wisdom teeth removal Referral to ENT       Relevant Orders   Ambulatory referral to ENT   Return if  symptoms worsen or fail to improve.     Leilani Merl, FNP, have reviewed all documentation for this visit. The documentation on 12/22/22 for the exam, diagnosis, procedures, and orders are all accurate  and complete.  Jacky Kindle, FNP  [facility] [phone] (phone) [phone] (fax)  [facility]

## 2022-12-22 NOTE — Assessment & Plan Note (Signed)
Chronic, worsening x 6 months Worse with wisdom teeth removal Referral to ENT

## 2023-01-21 ENCOUNTER — Encounter: Payer: Self-pay | Admitting: Family Medicine

## 2023-03-08 IMAGING — CR DG ELBOW COMPLETE 3+V*R*
1 series · 4 of 4 positions shown · non-contrast
Comparison: None.

CLINICAL DATA: Recent trauma, pain

EXAM:
RIGHT ELBOW - COMPLETE 3+ VIEW

[Series 1: dg elbow complete right (3+view) · 0.14mm/px · 4 of 4 slices shown]
[im 1/4]
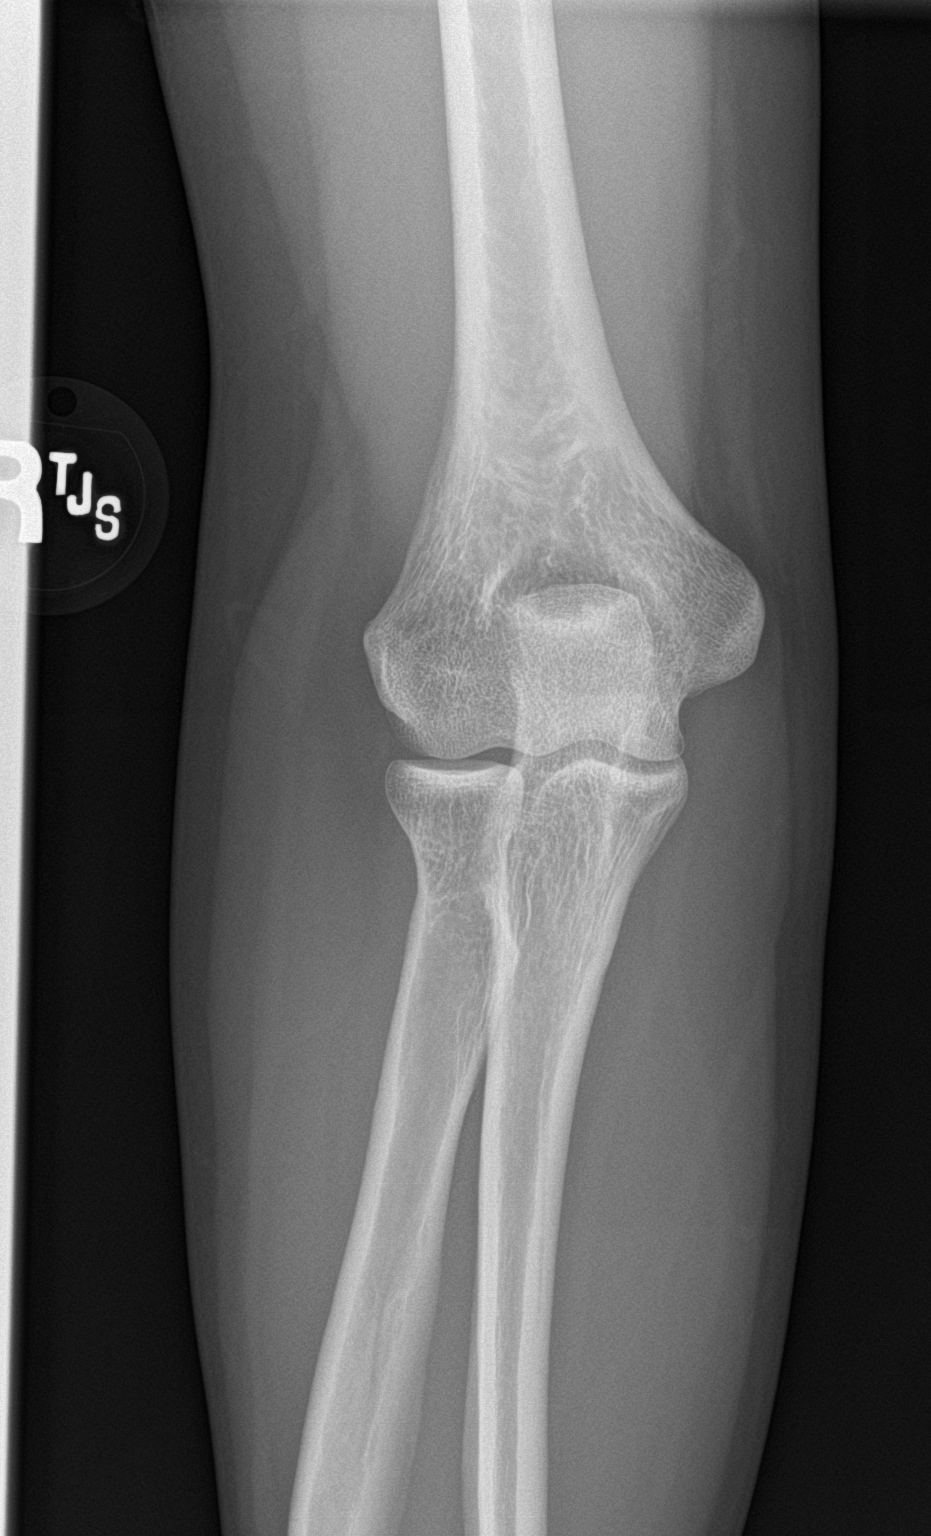
[im 2/4]
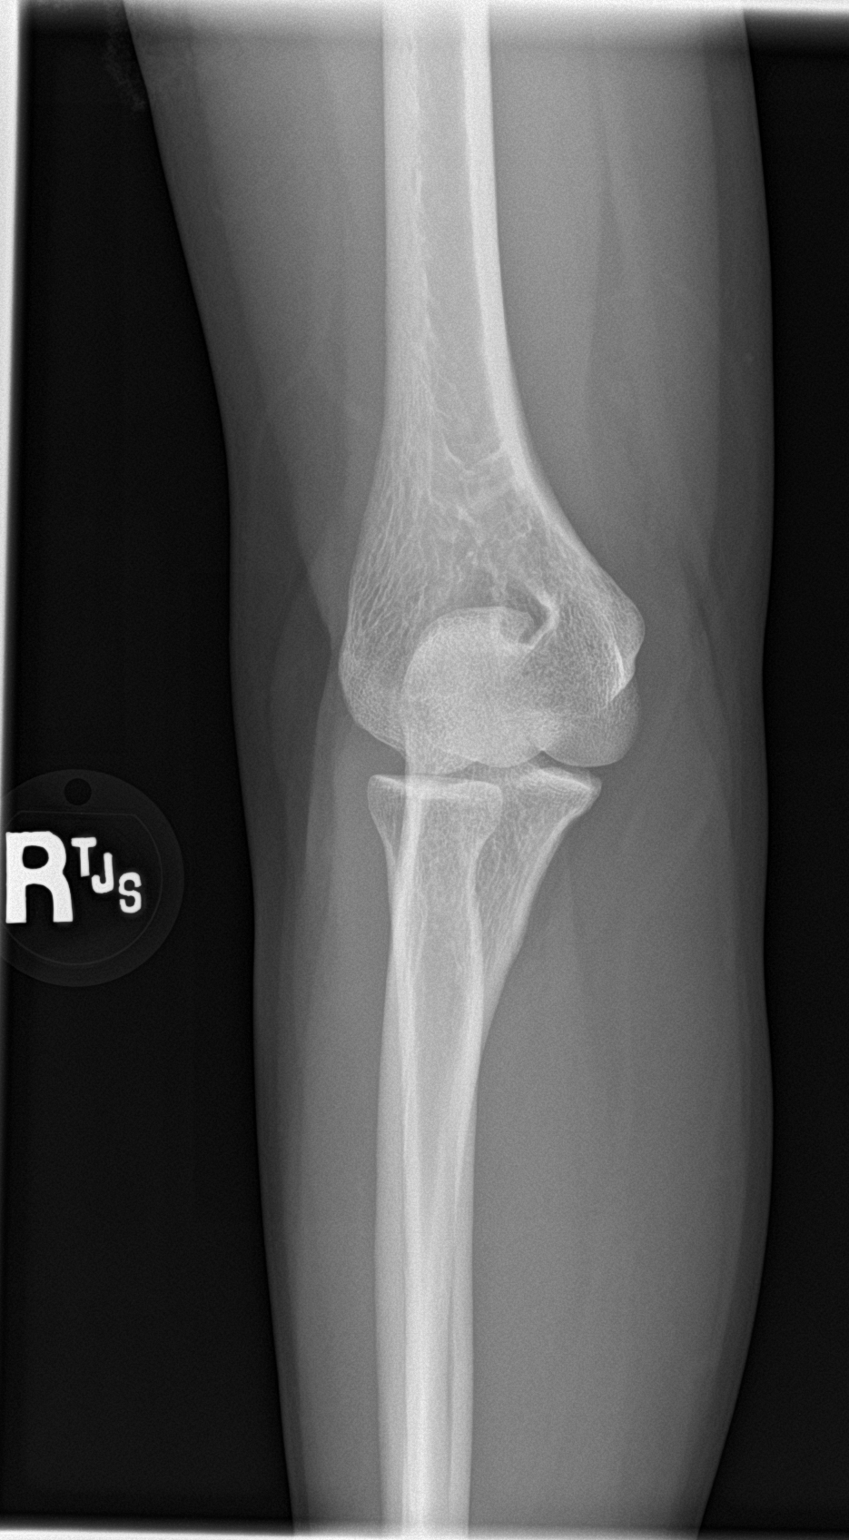
[im 3/4]
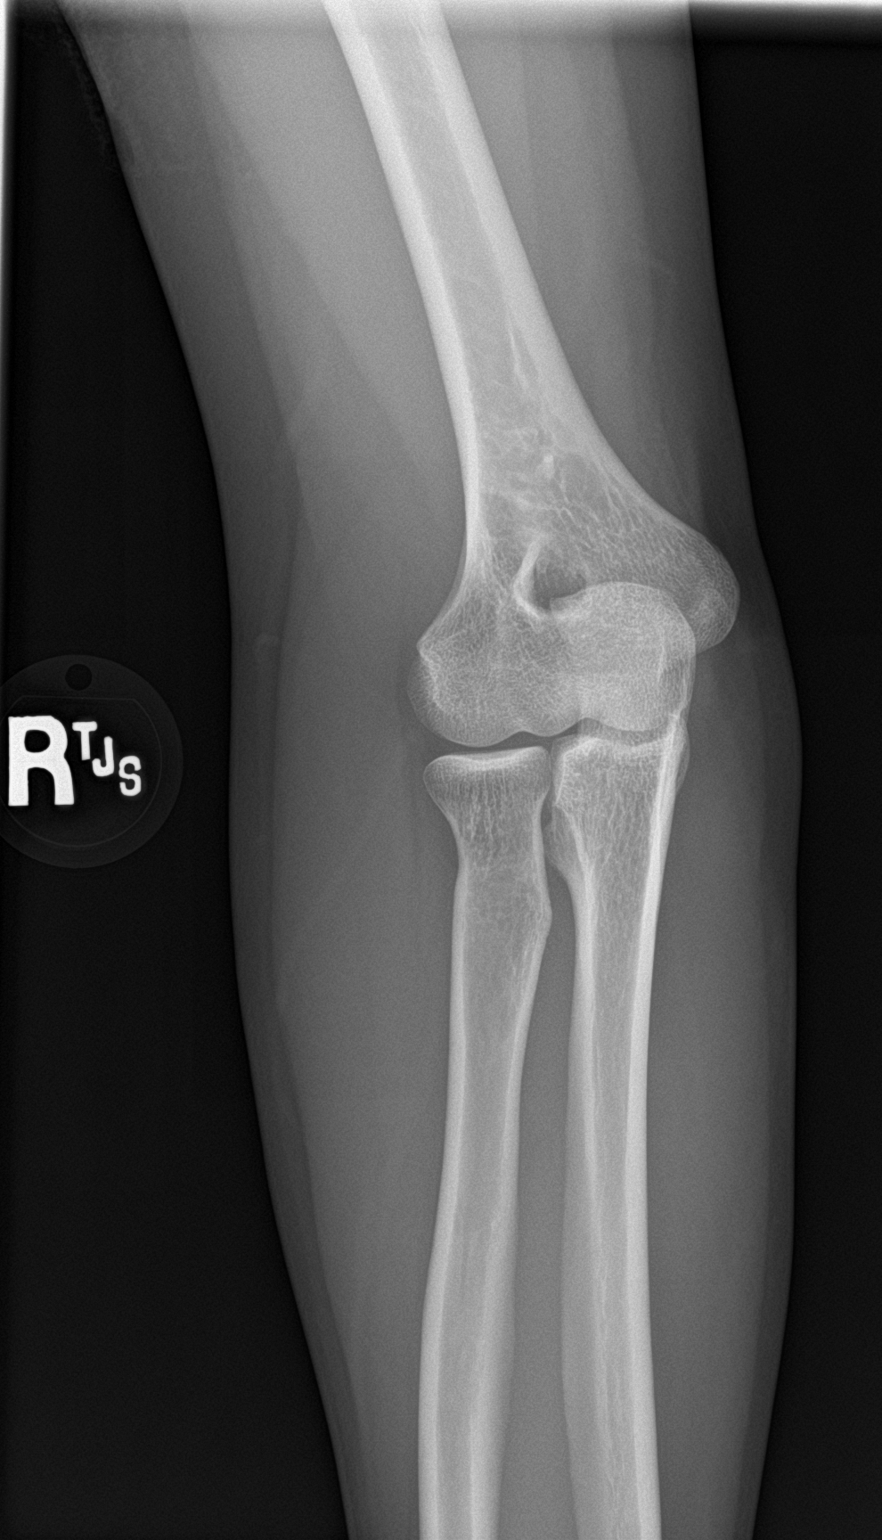
[im 4/4]
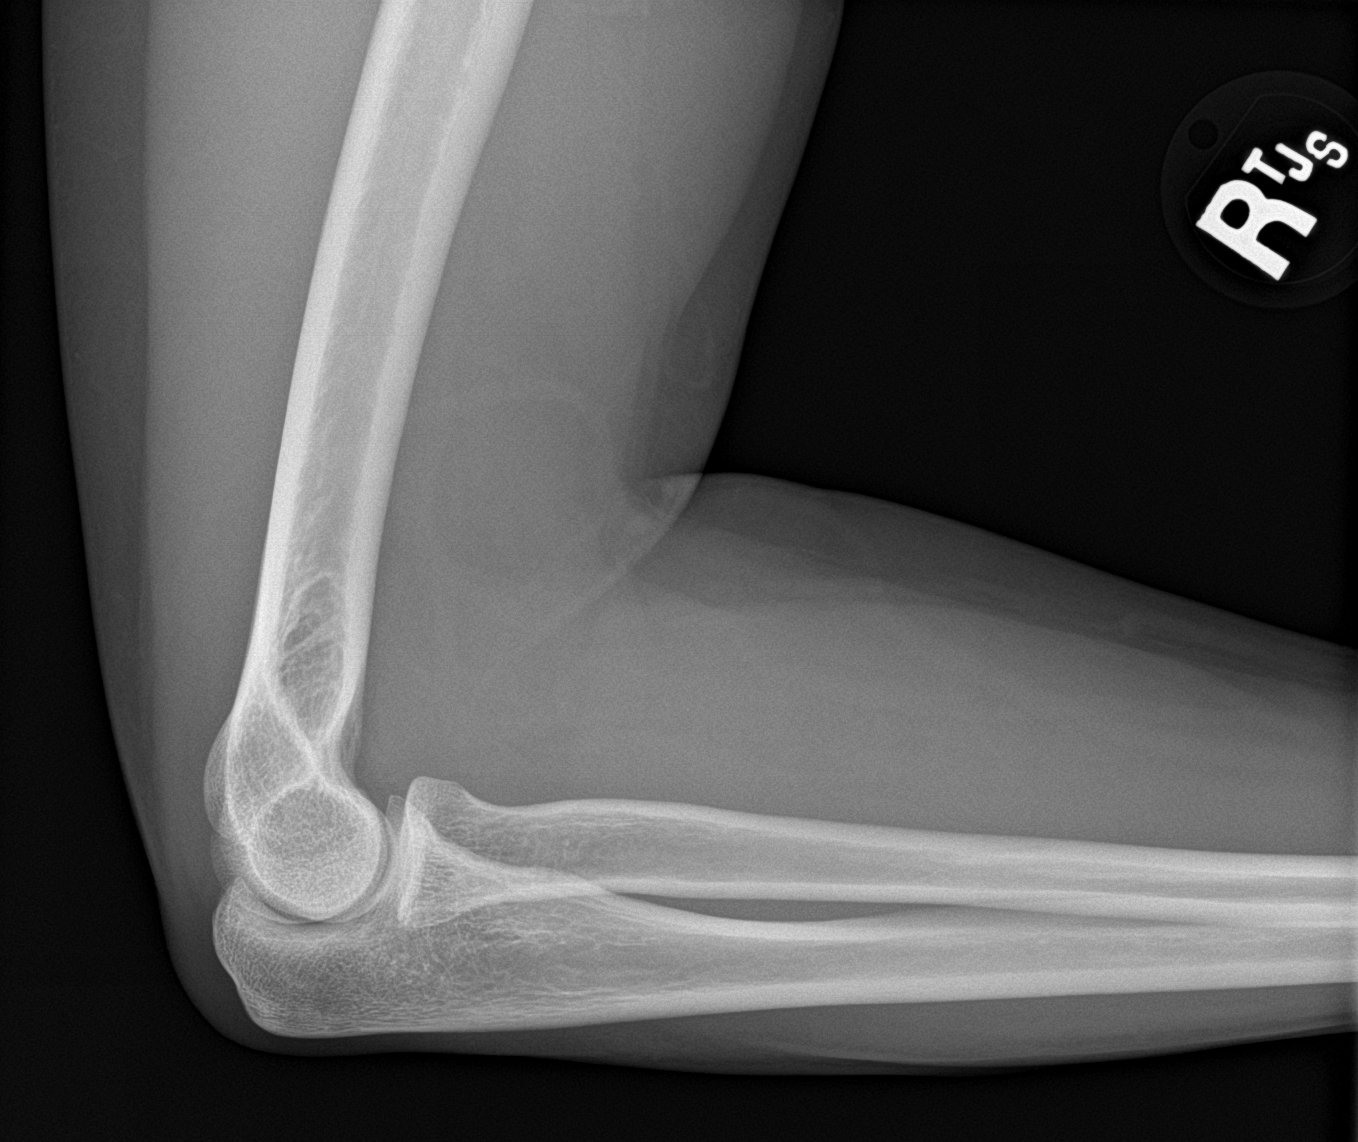

[4 of 4 positions shown; findings below may reference images not displayed]

FINDINGS: There is no evidence of fracture, dislocation, or joint effusion.
There is no evidence of arthropathy or other focal bone abnormality.
Soft tissues are unremarkable.
IMPRESSION: Negative.

## 2023-04-01 ENCOUNTER — Telehealth: Payer: Self-pay | Admitting: Psychiatry

## 2023-04-01 NOTE — Telephone Encounter (Signed)
 I show a RF available and verified with pharmacy. She was notified of the RF and Jessica's # provided.

## 2023-04-01 NOTE — Telephone Encounter (Signed)
 Patient lvm stating she is in desperate need of a refill of the Pristiq. She was a patient of Shanda Bumps last seen on 11/04/22.  It appears patient has not established care with another provider in our office.  Please advise.
